# Patient Record
Sex: Female | Born: 1945 | Race: White | Hispanic: No | Marital: Single | Smoking: Former smoker
Health system: Southern US, Community
[De-identification: ages and names within clinical notes are randomized; demographics above are authoritative.]

## PROBLEM LIST (undated history)

## (undated) DIAGNOSIS — R05 Cough: Secondary | ICD-10-CM

## (undated) DIAGNOSIS — R9430 Abnormal result of cardiovascular function study, unspecified: Secondary | ICD-10-CM

## (undated) DIAGNOSIS — K219 Gastro-esophageal reflux disease without esophagitis: Secondary | ICD-10-CM

## (undated) DIAGNOSIS — Z923 Personal history of irradiation: Secondary | ICD-10-CM

## (undated) DIAGNOSIS — N189 Chronic kidney disease, unspecified: Secondary | ICD-10-CM

## (undated) DIAGNOSIS — I739 Peripheral vascular disease, unspecified: Secondary | ICD-10-CM

## (undated) DIAGNOSIS — D649 Anemia, unspecified: Secondary | ICD-10-CM

## (undated) DIAGNOSIS — F419 Anxiety disorder, unspecified: Secondary | ICD-10-CM

## (undated) DIAGNOSIS — E269 Hyperaldosteronism, unspecified: Secondary | ICD-10-CM

## (undated) DIAGNOSIS — G629 Polyneuropathy, unspecified: Secondary | ICD-10-CM

## (undated) DIAGNOSIS — I10 Essential (primary) hypertension: Secondary | ICD-10-CM

## (undated) DIAGNOSIS — E1142 Type 2 diabetes mellitus with diabetic polyneuropathy: Secondary | ICD-10-CM

## (undated) DIAGNOSIS — J40 Bronchitis, not specified as acute or chronic: Secondary | ICD-10-CM

## (undated) DIAGNOSIS — H409 Unspecified glaucoma: Secondary | ICD-10-CM

## (undated) DIAGNOSIS — F32A Depression, unspecified: Secondary | ICD-10-CM

## (undated) DIAGNOSIS — IMO0002 Reserved for concepts with insufficient information to code with codable children: Secondary | ICD-10-CM

## (undated) DIAGNOSIS — G809 Cerebral palsy, unspecified: Secondary | ICD-10-CM

## (undated) DIAGNOSIS — E7800 Pure hypercholesterolemia, unspecified: Secondary | ICD-10-CM

## (undated) DIAGNOSIS — F329 Major depressive disorder, single episode, unspecified: Secondary | ICD-10-CM

## (undated) DIAGNOSIS — M109 Gout, unspecified: Secondary | ICD-10-CM

## (undated) DIAGNOSIS — R1310 Dysphagia, unspecified: Secondary | ICD-10-CM

## (undated) DIAGNOSIS — M1990 Unspecified osteoarthritis, unspecified site: Secondary | ICD-10-CM

## (undated) DIAGNOSIS — I5032 Chronic diastolic (congestive) heart failure: Secondary | ICD-10-CM

## (undated) DIAGNOSIS — I119 Hypertensive heart disease without heart failure: Secondary | ICD-10-CM

## (undated) HISTORY — DX: Abnormal result of cardiovascular function study, unspecified: R94.30

## (undated) HISTORY — PX: BREAST BIOPSY: SHX20

## (undated) HISTORY — PX: BLADDER SURGERY: SHX569

## (undated) HISTORY — DX: Reserved for concepts with insufficient information to code with codable children: IMO0002

## (undated) HISTORY — PX: MASTECTOMY: SHX3

## (undated) HISTORY — PX: CLOSED REDUCTION PROXIMAL FIBULAR FRACTURE: SUR232

## (undated) HISTORY — DX: Chronic diastolic (congestive) heart failure: I50.32

## (undated) HISTORY — DX: Cough: R05

## (undated) HISTORY — DX: Type 2 diabetes mellitus with diabetic polyneuropathy: E11.42

## (undated) HISTORY — PX: LEG SURGERY: SHX1003

---

## 2002-04-06 ENCOUNTER — Encounter: Payer: Self-pay | Admitting: Internal Medicine

## 2002-04-06 ENCOUNTER — Ambulatory Visit (HOSPITAL_COMMUNITY): Admission: RE | Admit: 2002-04-06 | Discharge: 2002-04-06 | Payer: Self-pay | Admitting: Internal Medicine

## 2003-03-01 ENCOUNTER — Ambulatory Visit (HOSPITAL_COMMUNITY): Admission: RE | Admit: 2003-03-01 | Discharge: 2003-03-01 | Payer: Self-pay

## 2003-11-08 ENCOUNTER — Ambulatory Visit (HOSPITAL_COMMUNITY): Admission: RE | Admit: 2003-11-08 | Discharge: 2003-11-08 | Payer: Self-pay | Admitting: Internal Medicine

## 2003-11-10 ENCOUNTER — Other Ambulatory Visit: Admission: RE | Admit: 2003-11-10 | Discharge: 2003-11-10 | Payer: Self-pay | Admitting: Unknown Physician Specialty

## 2003-12-14 ENCOUNTER — Emergency Department (HOSPITAL_COMMUNITY): Admission: EM | Admit: 2003-12-14 | Discharge: 2003-12-15 | Payer: Self-pay | Admitting: Emergency Medicine

## 2004-01-27 ENCOUNTER — Ambulatory Visit (HOSPITAL_COMMUNITY): Admission: RE | Admit: 2004-01-27 | Discharge: 2004-01-27 | Payer: Self-pay | Admitting: Internal Medicine

## 2004-02-02 ENCOUNTER — Ambulatory Visit (HOSPITAL_COMMUNITY): Admission: RE | Admit: 2004-02-02 | Discharge: 2004-02-02 | Payer: Self-pay | Admitting: Internal Medicine

## 2004-04-01 ENCOUNTER — Emergency Department (HOSPITAL_COMMUNITY): Admission: EM | Admit: 2004-04-01 | Discharge: 2004-04-02 | Payer: Self-pay | Admitting: Emergency Medicine

## 2004-12-11 ENCOUNTER — Emergency Department (HOSPITAL_COMMUNITY): Admission: EM | Admit: 2004-12-11 | Discharge: 2004-12-12 | Payer: Self-pay | Admitting: *Deleted

## 2004-12-17 ENCOUNTER — Ambulatory Visit (HOSPITAL_COMMUNITY): Admission: RE | Admit: 2004-12-17 | Discharge: 2004-12-17 | Payer: Self-pay | Admitting: Urology

## 2005-04-24 ENCOUNTER — Emergency Department (HOSPITAL_COMMUNITY): Admission: EM | Admit: 2005-04-24 | Discharge: 2005-04-24 | Payer: Self-pay | Admitting: Emergency Medicine

## 2005-04-26 ENCOUNTER — Emergency Department (HOSPITAL_COMMUNITY): Admission: EM | Admit: 2005-04-26 | Discharge: 2005-04-26 | Payer: Self-pay | Admitting: Emergency Medicine

## 2005-05-23 ENCOUNTER — Encounter: Admission: RE | Admit: 2005-05-23 | Discharge: 2005-05-23 | Payer: Self-pay | Admitting: Internal Medicine

## 2005-06-10 ENCOUNTER — Encounter: Admission: RE | Admit: 2005-06-10 | Discharge: 2005-06-10 | Payer: Self-pay | Admitting: Internal Medicine

## 2005-06-20 ENCOUNTER — Ambulatory Visit (HOSPITAL_COMMUNITY): Admission: RE | Admit: 2005-06-20 | Discharge: 2005-06-20 | Payer: Self-pay | Admitting: Internal Medicine

## 2005-12-24 ENCOUNTER — Encounter: Admission: RE | Admit: 2005-12-24 | Discharge: 2005-12-24 | Payer: Self-pay | Admitting: Internal Medicine

## 2006-03-12 ENCOUNTER — Ambulatory Visit: Admission: RE | Admit: 2006-03-12 | Discharge: 2006-03-12 | Payer: Self-pay | Admitting: Internal Medicine

## 2006-10-28 ENCOUNTER — Encounter: Admission: RE | Admit: 2006-10-28 | Discharge: 2006-10-28 | Payer: Self-pay | Admitting: Internal Medicine

## 2007-07-20 ENCOUNTER — Emergency Department (HOSPITAL_COMMUNITY): Admission: EM | Admit: 2007-07-20 | Discharge: 2007-07-20 | Payer: Self-pay | Admitting: Emergency Medicine

## 2007-07-23 ENCOUNTER — Emergency Department (HOSPITAL_COMMUNITY): Admission: EM | Admit: 2007-07-23 | Discharge: 2007-07-23 | Payer: Self-pay | Admitting: Emergency Medicine

## 2007-09-22 ENCOUNTER — Ambulatory Visit: Payer: Self-pay | Admitting: Cardiology

## 2007-09-22 ENCOUNTER — Inpatient Hospital Stay (HOSPITAL_COMMUNITY): Admission: AD | Admit: 2007-09-22 | Discharge: 2007-09-28 | Payer: Self-pay | Admitting: Internal Medicine

## 2007-12-25 ENCOUNTER — Emergency Department (HOSPITAL_COMMUNITY): Admission: EM | Admit: 2007-12-25 | Discharge: 2007-12-25 | Payer: Self-pay | Admitting: Emergency Medicine

## 2008-07-12 ENCOUNTER — Encounter: Admission: RE | Admit: 2008-07-12 | Discharge: 2008-07-12 | Payer: Self-pay | Admitting: Internal Medicine

## 2008-12-19 ENCOUNTER — Ambulatory Visit: Payer: Self-pay | Admitting: Internal Medicine

## 2008-12-26 ENCOUNTER — Encounter: Payer: Self-pay | Admitting: Internal Medicine

## 2008-12-30 ENCOUNTER — Ambulatory Visit (HOSPITAL_COMMUNITY): Admission: RE | Admit: 2008-12-30 | Discharge: 2008-12-30 | Payer: Self-pay | Admitting: Internal Medicine

## 2008-12-30 ENCOUNTER — Ambulatory Visit: Payer: Self-pay | Admitting: Internal Medicine

## 2009-01-13 ENCOUNTER — Encounter: Payer: Self-pay | Admitting: Internal Medicine

## 2009-01-31 ENCOUNTER — Ambulatory Visit (HOSPITAL_COMMUNITY): Admission: RE | Admit: 2009-01-31 | Discharge: 2009-01-31 | Payer: Self-pay | Admitting: Ophthalmology

## 2009-02-14 ENCOUNTER — Ambulatory Visit (HOSPITAL_COMMUNITY): Admission: RE | Admit: 2009-02-14 | Discharge: 2009-02-14 | Payer: Self-pay | Admitting: Ophthalmology

## 2009-03-30 ENCOUNTER — Encounter: Payer: Self-pay | Admitting: Internal Medicine

## 2009-04-19 ENCOUNTER — Ambulatory Visit (HOSPITAL_COMMUNITY): Admission: RE | Admit: 2009-04-19 | Discharge: 2009-04-19 | Payer: Self-pay | Admitting: Internal Medicine

## 2009-04-19 ENCOUNTER — Ambulatory Visit: Payer: Self-pay | Admitting: Internal Medicine

## 2009-07-26 ENCOUNTER — Ambulatory Visit (HOSPITAL_COMMUNITY): Admission: RE | Admit: 2009-07-26 | Discharge: 2009-07-26 | Payer: Self-pay | Admitting: Internal Medicine

## 2009-09-05 ENCOUNTER — Ambulatory Visit (HOSPITAL_COMMUNITY): Admission: RE | Admit: 2009-09-05 | Discharge: 2009-09-05 | Payer: Self-pay | Admitting: Internal Medicine

## 2009-11-05 ENCOUNTER — Emergency Department (HOSPITAL_COMMUNITY): Admission: EM | Admit: 2009-11-05 | Discharge: 2009-11-06 | Payer: Self-pay | Admitting: Emergency Medicine

## 2009-11-24 ENCOUNTER — Emergency Department (HOSPITAL_COMMUNITY): Admission: EM | Admit: 2009-11-24 | Discharge: 2009-11-24 | Payer: Self-pay | Admitting: Emergency Medicine

## 2009-12-23 ENCOUNTER — Emergency Department (HOSPITAL_COMMUNITY): Admission: EM | Admit: 2009-12-23 | Discharge: 2009-12-23 | Payer: Self-pay | Admitting: Emergency Medicine

## 2010-01-09 ENCOUNTER — Ambulatory Visit (HOSPITAL_COMMUNITY): Admission: RE | Admit: 2010-01-09 | Discharge: 2010-01-09 | Payer: Self-pay | Admitting: Internal Medicine

## 2010-01-29 ENCOUNTER — Ambulatory Visit (HOSPITAL_COMMUNITY): Admission: RE | Admit: 2010-01-29 | Discharge: 2010-01-29 | Payer: Self-pay | Admitting: Internal Medicine

## 2010-06-28 ENCOUNTER — Ambulatory Visit (HOSPITAL_COMMUNITY): Admission: RE | Admit: 2010-06-28 | Discharge: 2010-06-28 | Payer: Self-pay | Admitting: Internal Medicine

## 2010-07-30 ENCOUNTER — Ambulatory Visit (HOSPITAL_COMMUNITY): Admission: RE | Admit: 2010-07-30 | Discharge: 2010-07-30 | Payer: Self-pay | Admitting: Internal Medicine

## 2010-09-19 ENCOUNTER — Emergency Department (HOSPITAL_COMMUNITY)
Admission: EM | Admit: 2010-09-19 | Discharge: 2010-09-19 | Payer: Self-pay | Source: Home / Self Care | Admitting: Emergency Medicine

## 2010-11-11 ENCOUNTER — Encounter: Payer: Self-pay | Admitting: Internal Medicine

## 2010-11-15 ENCOUNTER — Inpatient Hospital Stay (HOSPITAL_COMMUNITY)
Admission: EM | Admit: 2010-11-15 | Discharge: 2010-11-20 | Disposition: A | Discharge status: Home / Self Care | Payer: Self-pay | Source: Home / Self Care | Attending: Internal Medicine | Admitting: Internal Medicine

## 2010-11-15 LAB — DIFFERENTIAL
Basophils Absolute: 0 10*3/uL (ref 0.0–0.1)
Basophils Relative: 0 % (ref 0–1)
Neutro Abs: 14.3 10*3/uL — ABNORMAL HIGH (ref 1.7–7.7)
Neutrophils Relative %: 86 % — ABNORMAL HIGH (ref 43–77)

## 2010-11-15 LAB — COMPREHENSIVE METABOLIC PANEL
ALT: 17 U/L (ref 0–35)
AST: 27 U/L (ref 0–37)
Alkaline Phosphatase: 121 U/L — ABNORMAL HIGH (ref 39–117)
CO2: 25 mEq/L (ref 19–32)
Calcium: 10.5 mg/dL (ref 8.4–10.5)
Chloride: 97 mEq/L (ref 96–112)
GFR calc Af Amer: >60 mL/min (ref >60)
GFR calc non Af Amer: 51 mL/min — ABNORMAL LOW (ref >60)
Potassium: 4.3 mEq/L (ref 3.5–5.1)
Sodium: 137 mEq/L (ref 135–145)

## 2010-11-15 LAB — URINALYSIS, ROUTINE W REFLEX MICROSCOPIC
Bilirubin Urine: NEGATIVE
Hgb urine dipstick: NEGATIVE
Specific Gravity, Urine: 1.015 (ref 1.005–1.030)
Urobilinogen, UA: 0.2 mg/dL (ref 0.0–1.0)

## 2010-11-15 LAB — CBC
Hemoglobin: 13.6 g/dL (ref 12.0–15.0)
MCHC: 32.5 g/dL (ref 30.0–36.0)
RBC: 4.38 MIL/uL (ref 3.87–5.11)
WBC: 16.6 10*3/uL — ABNORMAL HIGH (ref 4.0–10.5)

## 2010-11-16 LAB — BASIC METABOLIC PANEL
BUN: 18 mg/dL (ref 6–23)
CO2: 27 mEq/L (ref 19–32)
Calcium: 8.9 mg/dL (ref 8.4–10.5)
Creatinine, Ser: 1.01 mg/dL (ref 0.4–1.2)
Glucose, Bld: 136 mg/dL — ABNORMAL HIGH (ref 70–99)

## 2010-11-16 LAB — DIFFERENTIAL
Basophils Relative: 0 % (ref 0–1)
Lymphocytes Relative: 9 % — ABNORMAL LOW (ref 12–46)
Monocytes Absolute: 1.9 10*3/uL — ABNORMAL HIGH (ref 0.1–1.0)
Monocytes Relative: 13 % — ABNORMAL HIGH (ref 3–12)
Neutro Abs: 11.7 10*3/uL — ABNORMAL HIGH (ref 1.7–7.7)

## 2010-11-16 LAB — CBC
HCT: 39.8 % (ref 36.0–46.0)
Hemoglobin: 13 g/dL (ref 12.0–15.0)
MCH: 31.1 pg (ref 26.0–34.0)
MCHC: 32.7 g/dL (ref 30.0–36.0)

## 2010-11-18 LAB — CBC
HCT: 35.9 % — ABNORMAL LOW (ref 36.0–46.0)
MCHC: 32.3 g/dL (ref 30.0–36.0)
RDW: 14.7 % (ref 11.5–15.5)

## 2010-11-18 LAB — DIFFERENTIAL
Basophils Absolute: 0.1 10*3/uL (ref 0.0–0.1)
Basophils Relative: 1 % (ref 0–1)
Eosinophils Relative: 12 % — ABNORMAL HIGH (ref 0–5)
Monocytes Absolute: 0.9 10*3/uL (ref 0.1–1.0)

## 2010-11-18 LAB — BASIC METABOLIC PANEL
Calcium: 9.1 mg/dL (ref 8.4–10.5)
Creatinine, Ser: 0.9 mg/dL (ref 0.4–1.2)
GFR calc Af Amer: >60 mL/min (ref >60)
GFR calc non Af Amer: >60 mL/min (ref >60)
Glucose, Bld: 134 mg/dL — ABNORMAL HIGH (ref 70–99)
Sodium: 135 mEq/L (ref 135–145)

## 2010-11-20 NOTE — Consult Note (Signed)
NAME:  [name], [name]                  ACCOUNT NO.:  [id number]  MEDICAL RECORD NO.:  [id number]          PATIENT TYPE:  INP  LOCATION:  A336                          FACILITY:  [facility]  PHYSICIAN:  R. Roetta Sessions, M.D. DATE OF BIRTH:  08/09/1946  DATE OF CONSULTATION: DATE OF DISCHARGE:                                CONSULTATION   REQUESTING PHYSICIAN:  Tesfaye D. Felecia Shelling, MD.  REASON FOR CONSULTATION:  Acute colitis.  HISTORY OF PRESENT ILLNESS:  [name] [name] is a 65 year old Caucasian female. She has a history of cerebral palsy.  She has been seen previously by Dr. Jena Gauss in 2010 for hematochezia.  She underwent a colonoscopy at that time on April 19, 2009.  She was found to have a long redundant normal colon, but this was with a poor prep.  She tells me yesterday, she had nausea and vomited once.  She is having some generalized abdominal pain. It does radiate to her back.  She tells me she has not had any diarrhea. She believes she has had one bowel movement per day.  She denies rectal bleeding or melena.  She tells her appetite was fine prior to the onset of this.  She tells me her weight has been stable.  She has had a history of GERD, but this has been well controlled outpatient on Ranitidine 150 mg b.i.d.  She did have a CT scan of abdomen and pelvis with IV and oral contrast that showed descending colitis without perforation or abscess.  She was started on empiric Cipro 400 mg b.i.d. and Flagyl 500 mg t.i.d.  Her white count was found to be 15.1.  She was on Cipro 500 mg b.i.d. on October 26, 2010 through October 31, 2010.  PAST MEDICAL AND SURGICAL HISTORY:  She has history of: 1. Hypertension. 2. Cerebral palsy. 3. Osteoporosis. 4. Osteoarthritis. 5. Recurrent bronchitis. 6. Urinary incontinence. 7. Obesity. 8. GERD. 9. Scoliosis of lumbar spine. 10.Multiple lower extremity surgery. 11.Degenerative joint disease. 12.Last colonoscopy on April 19, 2009 for hematochezia that  showed a     long redundant normal colon with a poor prep.  Incomplete     colonoscopy on December 30, 2008 by Dr. Jena Gauss due to an incomplete     prep. 13.Gout. 14.Hyperlipidemia.  MEDICATIONS:  Prior to admission: 1. Albuterol q.4 h p.r.n. wheezing. 2. Lactulose 10 g/15 mL 2 tablespoons or 30 mL daily p.r.n.     constipation. 3. Optive eyedrops 1 drop in each eye p.r.n. 4. Acetaminophen 1 g q.4 h p.r.n. 5. Mylanta 30 mL b.i.d. p.r.n. 6. Boudre butt paste 3 times daily p.r.n. 7. Potassium chloride 20 mEq b.i.d. 8. Ranitidine 150 mg b.i.d. 9. Advair Diskus 250/50 one puff b.i.d. 10.Naproxen 500 mg b.i.d. p.r.n. 11.Benazepril 20 mg daily. 12.Cyclobenzaprine 10 mg t.i.d. 13.Lasix 40 mg b.i.d. 14.Lovastatin 20 mg daily. 15.Oxybutynin 5 mg b.i.d. 16.Lortab 10/500 mg q.i.d. 17.Allopurinol 300 mg daily. 18.Combivent 2 puffs q.i.d. 19.Oyster Calcium 500 mg with vitamin D t.i.d. 20.Gabapentin 100 mg t.i.d. 21.Neosporin to leg wound b.i.d. 22.Doxycycline 100 mg b.i.d., which was started on October 20, 2010  and completed on November 02, 2010. ALLERGIES:  No known drug allergies.  FAMILY HISTORY:  There is no known family history of carcinoma or chronic GI problems.  She has multiple siblings.  SOCIAL HISTORY:  [name] [name] is single.  She does not have any children. She resides at [facility].  She tells me her brother who lives in [place] is involved in her care.  She has a remote history of tobacco use.  Denies any alcohol or drug use.  REVIEW OF SYSTEMS:  See HPI, otherwise negative review of systems.  PHYSICAL EXAMINATION:  VITAL SIGNS:  Temperature 98.6, pulse 70, respirations 18, blood pressure 131/76, weight is 104.7 kg, height 65 inches. GENERAL:  She is an [name], who is alert, oriented, pleasant, and cooperative. HEENT:  Sclerae clear, nonicteric.  Conjunctivae pink.  Oropharynx pink and moist without any lesions. NECK:  Supple without mass or  thyromegaly. HEART:  Regular rhythm.  Normal S1 and S2 without murmurs, rubs, or gallops. LUNGS:  Faint expiratory wheezes, no acute distress. ABDOMEN:  She has a large scar to the left lower quadrant.  There is no rebound.  She has positive bowel sounds x4.  Abdomen is soft and nontender.  There is no rebound tenderness or guarding.  No hepatosplenomegaly or mass, although exam is limited given the patient's body habitus. EXTREMITIES:  She has multiple well-healed scars to both legs.  She has 2+ pitting edema bilaterally and changes of chronic venous stasis.  LABORATORY STUDIES:  She has a hemoglobin of 13, hematocrit 39.8, platelets 243.  Calcium 8.9, sodium 140, potassium 4.5, chloride 102, CO2 of 27, BUN 18, creatinine 1.01, glucose 136.  LFTs are normal except for alkaline phosphatase of 121.  Urinalysis was negative.  IMPRESSION:  [name] [name] is a 65 year old Caucasian female with history of cerebral palsy, who presents with an acute nausea and vomiting and descending colitis on CT.  Given her recent antibiotic use, we do need to rule out C. diff colitis, although she does not give report of diarrhea.  She could have other infectious process and ischemia remains in the differential as well.  It is reassuring that she did have a normal colonoscopy back in 2010, so development of inflammatory bowel disease would be unlikely at this point.  However, she is on NSAIDs and this could be NSAID induced colitis as well.  PLAN: 1. I would avoid NSAIDs at this point, agree with labs in the morning     to include CBC and CMP. 2. Stool for C. diff by PCR, ova and parasite, culture and     sensitivity. 3. Agree with empiric Cipro and Flagyl. 4. Continue supportive measures.  Thank you for allowing [place] to participate in the care of [name] [name].     Lorenza Burton, N.P.   ______________________________ R. Roetta Sessions, M.D.    Dustin Folks  D:  11/16/2010  T:  11/16/2010  Job:  [id number]  cc:    Ninetta Lights D. Felecia Shelling, MD Fax: [phone]  Electronically Signed by Lorenza Burton N.P. on 11/19/2010 04:11:24 PM Electronically Signed by Lorrin Goodell M.D. on 11/20/2010 01:43:20 PM

## 2010-11-23 ENCOUNTER — Encounter (INDEPENDENT_AMBULATORY_CARE_PROVIDER_SITE_OTHER): Payer: Self-pay | Admitting: *Deleted

## 2010-11-28 NOTE — Miscellaneous (Signed)
Summary: CONSULTATION  Clinical Lists Changes  NAME:  [name] [name], [name] [name]                  ACCOUNT NO.:  [id number]      MEDICAL RECORD NO.:  [id number]          PATIENT TYPE:  INP      LOCATION:  A336                          FACILITY:  [facility]      PHYSICIAN:  R. Roetta Sessions, M.D. DATE OF BIRTH:  [birth year]      DATE OF CONSULTATION:   DATE OF DISCHARGE:                                    CONSULTATION         REQUESTING PHYSICIAN:  Tesfaye D. Felecia Shelling, MD.      REASON FOR CONSULTATION:  Acute colitis.      HISTORY OF PRESENT ILLNESS:  [name] [name] is a 65 year old Caucasian female.   She has a history of cerebral palsy.  She has been seen previously by   Dr. Jena Gauss in 2010 for hematochezia.  She underwent a colonoscopy at that   time on April 19, 2009.  She was found to have a long redundant normal   colon, but this was with a poor prep.  She tells me yesterday, she had   nausea and vomited once.  She is having some generalized abdominal pain.   It does radiate to her back.  She tells me she has not had any diarrhea.   She believes she has had one bowel movement per day.  She denies rectal   bleeding or melena.  She tells her appetite was fine prior to the onset   of this.  She tells me her weight has been stable.  She has had a   history of GERD, but this has been well controlled outpatient on   Ranitidine 150 mg b.i.d.  She did have a CT scan of abdomen and pelvis   with IV and oral contrast that showed descending colitis without   perforation or abscess.  She was started on empiric Cipro 400 mg b.i.d.   and Flagyl 500 mg t.i.d.  Her white count was found to be 15.1.  She was   on Cipro 500 mg b.i.d. on October 26, 2010 through October 31, 2010.      PAST MEDICAL AND SURGICAL HISTORY:  She has history of:   1. Hypertension.   2. Cerebral palsy.   3. Osteoporosis.   4. Osteoarthritis.   5. Recurrent bronchitis.   6. Urinary incontinence.   7. Obesity.   8. GERD.   9. Scoliosis of lumbar  spine.   10.Multiple lower extremity surgery.   11.Degenerative joint disease.   12.Last colonoscopy on April 19, 2009 for hematochezia that showed a       long redundant normal colon with a poor prep.  Incomplete       colonoscopy on December 30, 2008 by Dr. Jena Gauss due to an incomplete       prep.   13.Gout.   14.Hyperlipidemia.      MEDICATIONS:  Prior to admission:   1. Albuterol q.4 h p.r.n. wheezing.   2. Lactulose 10 g/15 mL 2 tablespoons or 30 mL daily p.r.n.  constipation.   3. Optive eyedrops 1 drop in each eye p.r.n.   4. Acetaminophen 1 g q.4 h p.r.n.   5. Mylanta 30 mL b.i.d. p.r.n.   6. Boudre butt paste 3 times daily p.r.n.   7. Potassium chloride 20 mEq b.i.d.   8. Ranitidine 150 mg b.i.d.   9. Advair Diskus 250/50 one puff b.i.d.   10.Naproxen 500 mg b.i.d. p.r.n.   11.Benazepril 20 mg daily.   12.Cyclobenzaprine 10 mg t.i.d.   13.Lasix 40 mg b.i.d.   14.Lovastatin 20 mg daily.   15.Oxybutynin 5 mg b.i.d.   16.Lortab 10/500 mg q.i.d.   17.Allopurinol 300 mg daily.   18.Combivent 2 puffs q.i.d.   19.Oyster Calcium 500 mg with vitamin D t.i.d.   20.Gabapentin 100 mg t.i.d.   21.Neosporin to leg wound b.i.d.   22.Doxycycline 100 mg b.i.d., which was started on October 20, 2010       and completed on November 02, 2010.   ALLERGIES:  No known drug allergies.      FAMILY HISTORY:  There is no known family history of carcinoma or   chronic GI problems.  She has multiple siblings.      SOCIAL HISTORY:  [name] [name] is single.  She does not have any children.   She resides at [facility].  She tells me her brother who lives   in [place] is involved in her care.  She has a remote history of tobacco   use.  Denies any alcohol or drug use.      REVIEW OF SYSTEMS:  See HPI, otherwise negative review of systems.      PHYSICAL EXAMINATION:  VITAL SIGNS:  Temperature 98.6, pulse 70,   respirations 18, blood pressure 131/76, weight is 104.7 kg, height 65   inches.    GENERAL:  She is an [name], who is alert, oriented,   pleasant, and cooperative.   HEENT:  Sclerae clear, nonicteric.  Conjunctivae pink.  Oropharynx pink   and moist without any lesions.   NECK:  Supple without mass or thyromegaly.   HEART:  Regular rhythm.  Normal S1 and S2 without murmurs, rubs, or   gallops.   LUNGS:  Faint expiratory wheezes, no acute distress.   ABDOMEN:  She has a large scar to the left lower quadrant.  There is no   rebound.  She has positive bowel sounds x4.  Abdomen is soft and   nontender.  There is no rebound tenderness or guarding.  No   hepatosplenomegaly or mass, although exam is limited given the patient's   body habitus.   EXTREMITIES:  She has multiple well-healed scars to both legs.  She has   2+ pitting edema bilaterally and changes of chronic venous stasis.      LABORATORY STUDIES:  She has a hemoglobin of 13, hematocrit 39.8,   platelets 243.  Calcium 8.9, sodium 140, potassium 4.5, chloride 102,   CO2 of 27, BUN 18, creatinine 1.01, glucose 136.  LFTs are normal except   for alkaline phosphatase of 121.  Urinalysis was negative.      IMPRESSION:  [name] [name] is a 65 year old Caucasian female with history of   cerebral palsy, who presents with an acute nausea and vomiting and   descending colitis on CT.  Given her recent antibiotic use, we do need   to rule out C. diff colitis, although she does not give report of   diarrhea.  She could have  other infectious process and ischemia remains   in the differential as well.  It is reassuring that she did have a   normal colonoscopy back in 2010, so development of inflammatory bowel   disease would be unlikely at this point.  However, she is on NSAIDs and   this could be NSAID induced colitis as well.      PLAN:   1. I would avoid NSAIDs at this point, agree with labs in the morning       to include CBC and CMP.   2. Stool for C. diff by PCR, ova and parasite, culture and        sensitivity.   3. Agree with empiric Cipro and Flagyl.   4. Continue supportive measures.      Thank you for allowing [place] to participate in the care of [name] [name].               Lorenza Burton, N.P.         ______________________________   R. Roetta Sessions, M.D.            Dustin Folks  D:  11/16/2010  T:  11/16/2010  Job:  [id number]      cc:   Ninetta Lights D. Felecia Shelling, MD   Fax: [phone]      Electronically Signed by Lorenza Burton N.P. on 11/19/2010 04:11:24 PM   Electronically Signed by Lorrin Goodell M.D. on 11/20/2010 01:43:20 PM

## 2010-12-05 ENCOUNTER — Emergency Department (HOSPITAL_COMMUNITY): Payer: Medicare Other

## 2010-12-05 ENCOUNTER — Observation Stay (HOSPITAL_COMMUNITY)
Admission: EM | Admit: 2010-12-05 | Discharge: 2010-12-06 | Disposition: A | Discharge status: Skilled Nursing Facility | Payer: Medicare Other | Attending: Internal Medicine | Admitting: Internal Medicine

## 2010-12-05 DIAGNOSIS — Z9181 History of falling: Secondary | ICD-10-CM | POA: Insufficient documentation

## 2010-12-05 DIAGNOSIS — G809 Cerebral palsy, unspecified: Principal | ICD-10-CM | POA: Insufficient documentation

## 2010-12-05 DIAGNOSIS — K219 Gastro-esophageal reflux disease without esophagitis: Secondary | ICD-10-CM | POA: Insufficient documentation

## 2010-12-05 DIAGNOSIS — J40 Bronchitis, not specified as acute or chronic: Secondary | ICD-10-CM | POA: Insufficient documentation

## 2010-12-05 DIAGNOSIS — Z79899 Other long term (current) drug therapy: Secondary | ICD-10-CM | POA: Insufficient documentation

## 2010-12-05 DIAGNOSIS — M1990 Unspecified osteoarthritis, unspecified site: Secondary | ICD-10-CM | POA: Insufficient documentation

## 2010-12-05 DIAGNOSIS — I10 Essential (primary) hypertension: Secondary | ICD-10-CM | POA: Insufficient documentation

## 2010-12-05 LAB — DIFFERENTIAL
Basophils Absolute: 0 10*3/uL (ref 0.0–0.1)
Basophils Relative: 0 % (ref 0–1)
Eosinophils Absolute: 0.7 10*3/uL (ref 0.0–0.7)
Eosinophils Relative: 8 % — ABNORMAL HIGH (ref 0–5)
Monocytes Absolute: 0.7 10*3/uL (ref 0.1–1.0)
Neutro Abs: 5.5 10*3/uL (ref 1.7–7.7)

## 2010-12-05 LAB — POCT CARDIAC MARKERS
CKMB, poc: 1.3 ng/mL (ref 1.0–8.0)
Myoglobin, poc: 229 ng/mL (ref 12–200)
Troponin i, poc: <0.05 ng/mL (ref 0.00–0.09)

## 2010-12-05 LAB — CBC
HCT: 41.4 % (ref 36.0–46.0)
MCV: 96.1 fL (ref 78.0–100.0)
Platelets: 231 10*3/uL (ref 150–400)
RBC: 4.31 MIL/uL (ref 3.87–5.11)
RDW: 15.3 % (ref 11.5–15.5)
WBC: 8.8 10*3/uL (ref 4.0–10.5)

## 2010-12-05 LAB — COMPREHENSIVE METABOLIC PANEL
Albumin: 3.4 g/dL — ABNORMAL LOW (ref 3.5–5.2)
Alkaline Phosphatase: 56 U/L (ref 39–117)
BUN: 16 mg/dL (ref 6–23)
Calcium: 9.6 mg/dL (ref 8.4–10.5)
Glucose, Bld: 148 mg/dL — ABNORMAL HIGH (ref 70–99)
Potassium: 3.8 mEq/L (ref 3.5–5.1)
Sodium: 137 mEq/L (ref 135–145)
Total Protein: 6 g/dL (ref 6.0–8.3)

## 2010-12-05 LAB — URINALYSIS, ROUTINE W REFLEX MICROSCOPIC
Bilirubin Urine: NEGATIVE
Nitrite: NEGATIVE
Protein, ur: NEGATIVE mg/dL
Specific Gravity, Urine: 1.015 (ref 1.005–1.030)
Urobilinogen, UA: 0.2 mg/dL (ref 0.0–1.0)

## 2010-12-05 NOTE — H&P (Signed)
NAME:  [name] [name], [name] [name]                  ACCOUNT NO.:  [id number]  MEDICAL RECORD NO.:  [id number]          PATIENT TYPE:  INP  LOCATION:  A336                          FACILITY:  [facility]  PHYSICIAN:  Tiawanna Luchsinger D. Felecia Shelling, MD   DATE OF BIRTH:  08/18/1946  DATE OF ADMISSION:  11/15/2010 DATE OF DISCHARGE:  LH                             HISTORY & PHYSICAL   CHIEF COMPLAINT:  Abdominal pain.  HISTORY OF PRESENT ILLNESS:  This is a 65 year old female patient with history of multiple medical illnesses who is currently a resident of [facility], brought to emergency room with the above complaints.  The patient had a sudden onset of left lower quadrant pain. The pain gradually got worse.  She had no nausea or vomiting.  There was no diarrhea.  She was brought to the emergency room and evaluated.  Her CT scan of the abdomen showed inflammatory changes on descending colon. There is no abscess or perforation.  The patient, however, has leukocytosis.  She was empirically started on combination of IV antibiotics and admitted for further treatment.  REVIEW OF SYSTEMS:  The patient has no fever, chills, cough, shortness of breath, chest pain, nausea, vomiting, dysuria, urgency, or frequency of urination.  PAST MEDICAL HISTORY: 1. Cerebral palsy. 2. Hypertension. 3. Degenerative joint disease. 4. Urinary incontinence. 5. Morbid obesity. 6. Gastroesophageal reflux disease. 7. History of recurrent bronchitis.  CURRENT MEDICATIONS: 1. Advair Diskus 250/25 one puff b.i.d. 2. Allopurinol 300 mg daily. 3. Benazepril 20 mg daily. 4. Combivent 2 puffs q.i.d. 5. Detrol 5 mg p.o. b.i.d. 6. Flexeril 10 mg t.i.d. 7. Gabapentin 100 mg t.i.d. 8. Lortab 10/500 one tablet p.o. q.i.d. 9. Lactulose 30 mL daily as needed. 10.Lasix 40 mg b.i.d. 11.Mevacor 20 mg daily. 12.Os-Cal with vitamin D 1 tablet t.i.d. 13.KCl 20 mEq b.i.d. 14.Zantac 150 mg b.i.d. 15.Tylenol 1000 mg p.r.n. as needed.  SOCIAL  HISTORY:  The patient is currently a resident of [facility].  No history of alcohol, tobacco, or substance abuse.  The patient is disabled due to her cerebral palsy.  She is wheelchair bound and total care.  FAMILY HISTORY:  Not available at this time.  PHYSICAL EXAMINATION:  GENERAL:  The patient is alert, awake, and chronically sick looking. VITAL SIGNS:  Blood pressure 120/83, pulse 111, respiratory rate 20, temperature 98.3 degrees Fahrenheit. HEENT:  Pupils are equal and reactive. NECK:  Supple. CHEST:  Decreased air entry, few rhonchi. CARDIOVASCULAR SYSTEM:  First and second heart sound heard.  No murmur, no gallop. ABDOMEN:  Obese.  Bowel sounds positive.  The patient has moderate right lower quadrant tenderness. EXTREMITIES:  2+ edema.  LABS ON ADMISSION:  CBC; WBC 16.6, hemoglobin 13.6, and hematocrit 41.9. Sodium 137, potassium 4.3, chloride 97, carbon dioxide 25, glucose 149, BUN 19, creatinine 1.9.  Total bilirubin 0.6, alkaline phosphatase 21, SGOT 27, SGPT 17, total protein 7.3, albumin 4.1, calcium 10.5, lipase 22.  Urinalysis; specific gravity 1.015, pH 5.0, nitrite negative, leukocytes negative.  ASSESSMENT: 1. Acute colitis, etiology not clear, possibility of infection versus     inflammatory bowel disease  will be considered. 2. Cerebral palsy. 3. Morbid obesity. 4. Degenerative joint disease. 5. Hypertension. 6. History of gastroesophageal reflux disease. 7. History of bronchitis.  PLAN:  We will start the patient empirically on combination of ciprofloxacin and Flagyl.  We will continue her regular medications.  We will do GI consult.  We will continue to monitor CBC, BMP, and continue supportive care.     Jacole Capley D. Felecia Shelling, MD     TDF/MEDQ  D:  11/16/2010  T:  11/16/2010  Job:  [id number]  Electronically Signed by Avon Gully MD on 12/05/2010 08:18:34 AM

## 2010-12-05 NOTE — Discharge Summary (Signed)
  NAME:  [name], [name]                  ACCOUNT NO.:  [id number]  MEDICAL RECORD NO.:  [id number]          PATIENT TYPE:  INP  LOCATION:  A336                          FACILITY:  [facility]  PHYSICIAN:  Abbygayle Helfand D. Felecia Shelling, MD   DATE OF BIRTH:  27-Jul-1946  DATE OF ADMISSION:  11/15/2010 DATE OF DISCHARGE:  01/31/2012LH                              DISCHARGE SUMMARY   DISCHARGE DIAGNOSES: 1. Colitis, probably secondary to infectious etiology. 2. Cerebral palsy. 3. Morbid obesity. 4. Degenerative joint disease. 5. Hypertension. 6. History of gastroesophageal reflux disease. 7. History of recurrent bronchitis.  DISCHARGE MEDICATIONS: 1. Flagyl 500 mg p.o. t.i.d. for 5 days. 2. Cipro 500 mg p.o. b.i.d. for 5 days. 3. Advair 250/50 one puff b.i.d. 4. Allopurinol 300 mg p.o. daily. 5. Lisinopril 20 mg daily. 6. Ditropan 5 mg p.o. daily. 7. Flexeril 10 mg t.i.d. 8. Gabapentin 100 mg t.i.d. 9. Lortab 5/500 one tablet p.o. q.i.d. 10.Lasix 40 mg p.o. daily. 11.Zocor 10 mg daily. 12.Calcium carbonate 500 mg t.i.d. 13.KCl 20 mEq b.i.d. 14.Pepcid 20 mg b.i.d. 15.Atrovent and Proventil nebulizer q.i.d. p.r.n. for cough. 16.Colace 100 mg b.i.d. 17.Prednisone 40 mg p.o. for 3 days with tapering schedule.  DISPOSITION:  The patient will be discharged to [facility].  DISCHARGE INSTRUCTIONS:  The patient will continue current medications including oral antibiotics and the patient will be followed in the office.  She will continue her medications.  HOSPITAL COURSE:  This is a 65 year old female patient with a history of multiple medical illnesses was admitted due to abdominal pain.  Her CT scan showed descending colitis without perforation or abscess.  The patient was started on IV Flagyl and Cipro.  Over the hospital stay, the patient improved.  She will be [facility] back to rest home on oral antibiotics.     Allora Bains D. Felecia Shelling, MD     TDF/MEDQ  D:  11/20/2010  T:  11/21/2010   Job:  [id number]  Electronically Signed by Avon Gully MD on 12/05/2010 08:18:31 AM

## 2010-12-06 LAB — GLUCOSE, CAPILLARY: Glucose-Capillary: 178 mg/dL — ABNORMAL HIGH (ref 70–99)

## 2010-12-17 ENCOUNTER — Emergency Department (HOSPITAL_COMMUNITY)
Admission: EM | Admit: 2010-12-17 | Discharge: 2010-12-17 | Disposition: A | Discharge status: Home / Self Care | Payer: Medicare Other | Attending: Emergency Medicine | Admitting: Emergency Medicine

## 2010-12-17 ENCOUNTER — Emergency Department (HOSPITAL_COMMUNITY): Payer: Medicare Other

## 2010-12-17 DIAGNOSIS — K219 Gastro-esophageal reflux disease without esophagitis: Secondary | ICD-10-CM | POA: Insufficient documentation

## 2010-12-17 DIAGNOSIS — Z79899 Other long term (current) drug therapy: Secondary | ICD-10-CM | POA: Insufficient documentation

## 2010-12-17 DIAGNOSIS — E7800 Pure hypercholesterolemia, unspecified: Secondary | ICD-10-CM | POA: Insufficient documentation

## 2010-12-17 DIAGNOSIS — R5383 Other fatigue: Secondary | ICD-10-CM | POA: Insufficient documentation

## 2010-12-17 DIAGNOSIS — M810 Age-related osteoporosis without current pathological fracture: Secondary | ICD-10-CM | POA: Insufficient documentation

## 2010-12-17 DIAGNOSIS — J4489 Other specified chronic obstructive pulmonary disease: Secondary | ICD-10-CM | POA: Insufficient documentation

## 2010-12-17 DIAGNOSIS — G809 Cerebral palsy, unspecified: Secondary | ICD-10-CM | POA: Insufficient documentation

## 2010-12-17 DIAGNOSIS — I10 Essential (primary) hypertension: Secondary | ICD-10-CM | POA: Insufficient documentation

## 2010-12-17 DIAGNOSIS — Z8639 Personal history of other endocrine, nutritional and metabolic disease: Secondary | ICD-10-CM | POA: Insufficient documentation

## 2010-12-17 DIAGNOSIS — R000 Tachycardia, unspecified: Secondary | ICD-10-CM | POA: Insufficient documentation

## 2010-12-17 DIAGNOSIS — M1990 Unspecified osteoarthritis, unspecified site: Secondary | ICD-10-CM | POA: Insufficient documentation

## 2010-12-17 DIAGNOSIS — Z862 Personal history of diseases of the blood and blood-forming organs and certain disorders involving the immune mechanism: Secondary | ICD-10-CM | POA: Insufficient documentation

## 2010-12-17 DIAGNOSIS — R5381 Other malaise: Secondary | ICD-10-CM | POA: Insufficient documentation

## 2010-12-17 DIAGNOSIS — J449 Chronic obstructive pulmonary disease, unspecified: Secondary | ICD-10-CM | POA: Insufficient documentation

## 2010-12-17 LAB — DIFFERENTIAL
Basophils Absolute: 0 10*3/uL (ref 0.0–0.1)
Basophils Relative: 1 % (ref 0–1)
Eosinophils Absolute: 0.4 10*3/uL (ref 0.0–0.7)
Monocytes Relative: 11 % (ref 3–12)
Neutro Abs: 3.5 10*3/uL (ref 1.7–7.7)
Neutrophils Relative %: 51 % (ref 43–77)

## 2010-12-17 LAB — CBC
Hemoglobin: 13.9 g/dL (ref 12.0–15.0)
MCH: 31.4 pg (ref 26.0–34.0)
Platelets: 224 10*3/uL (ref 150–400)
RBC: 4.43 MIL/uL (ref 3.87–5.11)
WBC: 6.8 10*3/uL (ref 4.0–10.5)

## 2010-12-17 LAB — URINALYSIS, ROUTINE W REFLEX MICROSCOPIC
Bilirubin Urine: NEGATIVE
Nitrite: NEGATIVE
Specific Gravity, Urine: 1.019 (ref 1.005–1.030)
Urobilinogen, UA: 0.2 mg/dL (ref 0.0–1.0)

## 2010-12-17 LAB — POCT I-STAT, CHEM 8
Calcium, Ion: 1.18 mmol/L (ref 1.12–1.32)
HCT: 44 % (ref 36.0–46.0)
Hemoglobin: 15 g/dL (ref 12.0–15.0)
Sodium: 140 mEq/L (ref 135–145)
TCO2: 26 mmol/L (ref 0–100)

## 2010-12-17 LAB — GLUCOSE, CAPILLARY: Glucose-Capillary: 156 mg/dL — ABNORMAL HIGH (ref 70–99)

## 2010-12-17 LAB — PREGNANCY, URINE: Preg Test, Ur: NEGATIVE

## 2010-12-18 LAB — URINE CULTURE

## 2010-12-28 NOTE — H&P (Signed)
  NAME:  [name] [name], [name] [name]                  ACCOUNT NO.:  [id number]  MEDICAL RECORD NO.:  [id number]           PATIENT TYPE:  I  LOCATION:  A309                          FACILITY:  [facility]  PHYSICIAN:  Una Yeomans D. Felecia Shelling, MD   DATE OF BIRTH:  October 11, 1946  DATE OF ADMISSION:  12/05/2010 DATE OF DISCHARGE:  LH                             HISTORY & PHYSICAL   CHIEF COMPLAINT:  Difficult to ambulate.  HISTORY OF PRESENT ILLNESS:  This is a 65 year old female patient with history of multiple medical illnesses, who is a resident of [facility], who was brought to emergency room due to difficult to ambulate.  The assisted living was unable to provide enough care for the patient.  She is total care and the patient was not able to participate in her daily living activity.  She was admitted for placement.  REVIEW OF SYSTEMS:  No fever, chills, headache, cough, chest pain, shortness of breath, abdominal pain, dysuria, urgency or frequency of urination.  PAST MEDICAL HISTORY: 1. Cerebral palsy. 2. Morbid obesity. 3. Degenerative joint disease. 4. Hypertension. 5. Gastroesophageal reflux disease. 6. Recurrent bronchitis. 7. History of recent colitis.  CURRENT MEDICATIONS: 1. Advair 250/50 one puff b.i.d. 2. Allopurinol 300 mg daily. 3. Lisinopril 20 mg daily. 4. Ditropan 5 mg p.o. daily. 5. Flexeril 10 mg t.i.d. 6. Gabapentin 100 mg t.i.d. 7. Lortab 5/500 one tablet p.o. q.i.d. 8. Lasix 40 mg daily. 9. Zocor 10 mg daily. 10.Calcium carbonate 500 mg t.i.d. 11.KCl 20 mg b.i.d. 12.Pepcid 20 mg b.i.d. 13.Atrovent and Proventil nebulizer q.i.d. 14.Colace 100 mg b.i.d.  SOCIAL HISTORY:  The patient is currently a resident of assisted living home.  The patient has advanced cerebral palsy and she requires total care.  No history of alcohol, tobacco, or substance abuse.  FAMILY HISTORY:  Not available at this time.  PHYSICAL EXAMINATION:  GENERAL:  The patient is alert, awake,  and chronically sick looking. VITAL SIGNS:  Blood pressure 123/87, pulse 114, respiratory rate 20, temperature 98 degrees Fahrenheit. HEENT:  Pupils are equal and reactive. NECK:  Supple. CHEST:  Clear lung fields, good air entry. CARDIOVASCULAR SYSTEM:  First and second heart sounds heard.  No murmur, no gallop. ABDOMEN:  Soft and lax.  Bowel sound is positive.  No mass or organomegaly. EXTREMITIES:  No leg edema.  ASSESSMENT:  This is a 65 year old female patient with history of multiple medical illnesses including morbid obesity and cerebral palsy. The patient is being admitted for observation for placement to nursing home.  The assisted living facility is not able to provide the patient her care.  PLAN:  We will continue the patient on her regular medications.  We will do social worker consult for placement and continue supportive care.     Juleen Sorrels D. Felecia Shelling, MD     TDF/MEDQ  D:  12/06/2010  T:  12/06/2010  Job:  [id number]  Electronically Signed by Avon Gully MD on 12/27/2010 08:52:48 AM

## 2010-12-28 NOTE — Discharge Summary (Signed)
  NAME:  Manwarren, Diavian                  ACCOUNT NO.:  [id number]  MEDICAL RECORD NO.:  [id number]           PATIENT TYPE:  I  LOCATION:  A309                          FACILITY:  [facility]  PHYSICIAN:  Zamar Odwyer D. Felecia Shelling, MD   DATE OF BIRTH:  Nov 29, 1945  DATE OF ADMISSION:  12/05/2010 DATE OF DISCHARGE:  02/16/2012LH                              DISCHARGE SUMMARY   DISCHARGED DIAGNOSES: 1. Severe deconditioning. 2. Advanced cerebral palsy. 3. Degenerative joint disease. 4. History of recent colitis. 5. Hypertension. 6. Gastroesophageal reflux disease. 7. Recurrent bronchitis.  DISCHARGE MEDICATIONS: 1. Advair 250/50 one puff b.i.d. 2. Benazepril 30 mg p.o. daily. 3. Allopurinol 300 mg p.o. daily. 4. Calcium with vitamin D 1 tablet p.o. t.i.d. 5. Atrovent and Proventil nebulizer q.4 h p.r.n.. 6. Flexeril 10 mg t.i.d. 7. Furosemide 40 mg daily. 8. Gabapentin 100 t.i.d. 9. Simvastatin 10 mg daily. 10.Naproxen 500 mg b.i.d. 11.Ditropan 5 mg b.i.d. 12.Pepcid 20 mg b.i.d. 13.Potassium 20 mEq p.o. b.i.d. 14.Acetaminophen 500 mg q.6 h p.r.n. 15.Lactulose 30 mL p.o. daily p.r.n. 16.Lortab 5/500 one tablet p.o. q.6 h p.r.n.  DISPOSITION:  The patient was being transferred to skilled nursing facility.  DISCHARGE INSTRUCTIONS:  The patient will receive physical therapy and continue skilled nursing care.  LABORATORY DATA:  On discharge, sodium 137, potassium 3.8, chloride 98, carbon dioxide 13, glucose 148, BUN 16, creatinine 0.7, calcium 9.6. CBC:  WBC 8.8, hemoglobin 13.2, hematocrit 41.4, and platelets 231.  HOSPITAL COURSE:  This is a 65 year old female patient with history of multiple medical illnesses including cerebral palsy, was admitted due to severe deconditioning.  The patient was previously in assisted living who were not able to provide her care.  The patient is going to be placed in nursing home to continue skilled nursing care and her regular medications.     Mliss Wedin  D. Felecia Shelling, MD     TDF/MEDQ  D:  12/06/2010  T:  12/06/2010  Job:  [id number]  Electronically Signed by Avon Gully MD on 12/27/2010 08:52:44 AM

## 2011-01-06 LAB — BASIC METABOLIC PANEL
BUN: 10 mg/dL (ref 6–23)
CO2: 27 mEq/L (ref 19–32)
Chloride: 94 mEq/L — ABNORMAL LOW (ref 96–112)
GFR calc Af Amer: >60 mL/min (ref >60)
Potassium: 3.5 mEq/L (ref 3.5–5.1)

## 2011-01-06 LAB — DIFFERENTIAL
Eosinophils Absolute: 0 10*3/uL (ref 0.0–0.7)
Eosinophils Relative: 0 % (ref 0–5)
Lymphs Abs: 2 10*3/uL (ref 0.7–4.0)
Monocytes Relative: 10 % (ref 3–12)

## 2011-01-06 LAB — CBC
HCT: 43.9 % (ref 36.0–46.0)
MCV: 91.4 fL (ref 78.0–100.0)
RBC: 4.81 MIL/uL (ref 3.87–5.11)
WBC: 14.4 10*3/uL — ABNORMAL HIGH (ref 4.0–10.5)

## 2011-01-06 LAB — URIC ACID: Uric Acid, Serum: 9.2 mg/dL — ABNORMAL HIGH (ref 2.4–7.0)

## 2011-01-06 LAB — CULTURE, BLOOD (ROUTINE X 2): Report Status: 1222011

## 2011-01-13 LAB — URINALYSIS, ROUTINE W REFLEX MICROSCOPIC
Bilirubin Urine: NEGATIVE
Nitrite: NEGATIVE
Specific Gravity, Urine: 1.015 (ref 1.005–1.030)
Urobilinogen, UA: 0.2 mg/dL (ref 0.0–1.0)

## 2011-01-13 LAB — DIFFERENTIAL
Basophils Absolute: 0 10*3/uL (ref 0.0–0.1)
Eosinophils Relative: 1 % (ref 0–5)
Lymphocytes Relative: 9 % — ABNORMAL LOW (ref 12–46)
Lymphs Abs: 1 10*3/uL (ref 0.7–4.0)
Monocytes Absolute: 0.7 10*3/uL (ref 0.1–1.0)
Monocytes Relative: 6 % (ref 3–12)

## 2011-01-13 LAB — BASIC METABOLIC PANEL
GFR calc non Af Amer: 60 mL/min — ABNORMAL LOW (ref >60)
Glucose, Bld: 193 mg/dL — ABNORMAL HIGH (ref 70–99)
Potassium: 3.7 mEq/L (ref 3.5–5.1)
Sodium: 135 mEq/L (ref 135–145)

## 2011-01-13 LAB — CBC
HCT: 45 % (ref 36.0–46.0)
Hemoglobin: 14.9 g/dL (ref 12.0–15.0)
RDW: 13.9 % (ref 11.5–15.5)

## 2011-01-13 LAB — URINE MICROSCOPIC-ADD ON

## 2011-01-13 LAB — URINE CULTURE: Culture: NO GROWTH

## 2011-01-30 LAB — BASIC METABOLIC PANEL
BUN: 13 mg/dL (ref 6–23)
CO2: 32 mEq/L (ref 19–32)
Chloride: 100 mEq/L (ref 96–112)
Creatinine, Ser: 0.82 mg/dL (ref 0.4–1.2)
Glucose, Bld: 94 mg/dL (ref 70–99)

## 2011-03-05 NOTE — Op Note (Signed)
NAME:  [name] [name], [name] [name]                  ACCOUNT NO.:  [id number]   MEDICAL RECORD NO.:  [id number]          PATIENT TYPE:  AMB   LOCATION:  DAY                           FACILITY:  [facility]   PHYSICIAN:  R. Roetta Sessions, M.D. DATE OF BIRTH:  1946/05/17   DATE OF PROCEDURE:  04/19/2009  DATE OF DISCHARGE:                               OPERATIVE REPORT   PROCEDURE PERFORMED:  Diagnostic colonoscopy.   INDICATIONS FOR PROCEDURE:  A 65 year old resident of a nursing home  with history of intermittent hematochezia.  We attempted colonoscopy  back in March this year.  Prep was inadequate.  She returns for a  diagnostic colonoscopy today.  She apparently has not had hematochezia  recently.  Colonoscopy now being done.  Risks, benefits, alternatives  and limitations have been discussed, questions answered.  Please see  documentation in the medical record.   PROCEDURE NOTE:  O2 saturation, blood pressure, pulse and respirations  monitored throughout the entire procedure.  Conscious sedation with  Versed 3 mg IV, Demerol 50 mg IV in divided doses.  Instrument is Pentax  video chip system.   FINDINGS:  Digital rectal exam revealed no abnormalities.   ENDOSCOPIC FINDINGS:  Unfortunately, the prep was again poor but felt to  be doable.  Colon:  Colonic mucosa surveyed from the rectosigmoid junction through  the left, transverse, right colon, area of appendiceal orifice,  ileocecal valve and cecum.  These structures were seen and photographed  for the record.  From this level, the scope was slowly and cautiously  withdrawn.  Previously mentioned mucosal surfaces were again seen.  The  patient a long capacious colon.  It took some maneuvers including  changing the patient's position on the stretcher and external abdominal  pressure to reach the cecum, quite a bit of vegetable  matter and  semiformed stool throughout the colon which had to be washed and  suctioned.  The scope kept getting occluded.  I  spent some time cleaning  things out all the way to the cecum.  However, the colonic mucosa that  was seen, appeared entirely normal.  Scope was pulled down to the rectum  where a thorough examination of the rectal mucosa including retroflex  view of the anal verge demonstrated no abnormalities.  Cecal withdrawal  time 7 minutes.   IMPRESSION:  1. Normal rectum.  2. Long redundant capacious colon.  No gross colonic mucosal      abnormalities.  Poor prep compromised exam.   RECOMMENDATIONS:  1. Consider repeat screening colonoscopy in 10 years.  2. If the patient has any evidence of ongoing GI bleeding or anemia,      then further evaluation would be warranted.      Jonathon Bellows, M.D.  Electronically Signed     RMR/MEDQ  D:  04/19/2009  T:  04/19/2009  Job:  [id number]   cc:   Tesfaye D. Felecia Shelling, MD  Fax: [phone]

## 2011-03-05 NOTE — Op Note (Signed)
NAME:  Vandervort, Cing                  ACCOUNT NO.:  [id number]   MEDICAL RECORD NO.:  [id number]          PATIENT TYPE:  AMB   LOCATION:  DAY                           FACILITY:  [facility]   PHYSICIAN:  R. Roetta Sessions, M.D. DATE OF BIRTH:  03-20-1946   DATE OF PROCEDURE:  12/30/2008  DATE OF DISCHARGE:                               OPERATIVE REPORT   PROCEDURE:  Incomplete, poor prep screening diagnostic colonoscopy.   INDICATIONS FOR PROCEDURE:  A 65 year old lady with cerebral palsy,  nursing home patient with intermittent hematochezia.  She comes for  colonoscopy.  Risks, benefits, alternatives and limitations have been  discussed.  She has never had her lower GI tract imaged previously.  No  family history of colorectal cancer.   PROCEDURE NOTE:  O2 saturation, blood pressure, pulse, respirations were  monitored throughout the entire procedure.  Conscious sedation:  Versed  3 mg IV, Demerol 50 mg IV, in divided doses.  Instrument:  Pentax video  chip system.   FINDINGS:  Digital rectal exam revealed no abnormalities.   Endoscopic findings:  The prep was inadequate and was poor.  I advanced  the scope into the left colon and saw a couple of diverticula.  There  was quite a bit of viscous, liquid stool.  I suctioned and washed out  what I could.  The further I got the worse the prep became to the point  there were solid stool elements in the proximal left colon which really  precluded an adequate exam.  Therefore, I backed off and terminated the  procedure.  The patient tolerated what we did very well.   IMPRESSION:  Inadequate colon prep.  Colonoscopy terminated by me.  The  patient tolerated the procedure well.  Will need to regroup and get her  adequately prepped and try again in the next several weeks.      Jonathon Bellows, M.D.  Electronically Signed     RMR/MEDQ  D:  12/30/2008  T:  12/30/2008  Job:  [id number]   cc:   Tesfaye D. Felecia Shelling, MD  Fax: [phone]

## 2011-03-05 NOTE — H&P (Signed)
NAME:  [name] [name], [name] [name]                  ACCOUNT NO.:  [id number]   MEDICAL RECORD NO.:  [id number]          PATIENT TYPE:  INP   LOCATION:  A340                          FACILITY:  [facility]   PHYSICIAN:  Tesfaye D. Felecia Shelling, MD   DATE OF BIRTH:  [birth year]   DATE OF ADMISSION:  09/22/2007  DATE OF DISCHARGE:  [facility]                              HISTORY & PHYSICAL   CHIEF COMPLAINT:  Cough and shortness of breath.   HISTORY OF PRESENT ILLNESS:  This is a 65 year old patient with history  of multiple medical illnesses who was a resident of a local rest home.  She was brought to my office due to above complaints.  The patient  initially had symptoms of upper respiratory tract infection which  progressively got worse, and the patient has been coughing frequently.  She has some associated shortness of breath and wheezing.  The patient  was receiving cough syrup and inhalers.  However, her symptoms got  worse, and the patient was having difficulty breathing even at rest.  She was evaluated in the office and was admitted as a possible case of  chronic obstructive pulmonary disease with a possible superimposed  pneumonia.   REVIEW OF SYSTEMS:  The patient has generalized weakness and loss of  appetite.  No fever or chills.  No chest pain, nausea, vomiting,  abdominal pain, dysuria, urgency or frequency of urination.   PAST MEDICAL HISTORY:  1. Cerebral palsy.  2. History of recurrent bronchitis.  3. Hypertension.  4. Urinary incontinence.  5. Obesity.  6. Gastroesophageal reflux disease.   CURRENT MEDICATIONS:  1. Benazepril 30 mg p.o. daily.  2. Combivent inhaler two puffs q.i.d.  3. Flexeril 10 mg t.i.d.  4. Lasix 40 mg b.i.d.  5. Lortab 5/500 one tablets q.4 h p.r.n.  6. Lactulose 10 ml p.o. daily p.r.n.  7. KCl 20 mEq daily.  8. Zantac 150 mg b.i.d.  9. Vesicare 10 mg p.o. daily.  10.Acetaminophen one tablet 500 mg q.6 h p.r.n. pain.   SOCIAL HISTORY:  The patient is current resident of  [facility].  No history of alcohol, tobacco or substance abuse.   PHYSICAL EXAMINATION:  GENERAL:  The patient is alert awake and acutely  sick looking.  VITAL SIGNS:  Blood pressure 122/80, pulse 120 per minute, respiratory  rate 32, temperature 98 degrees Fahrenheit.  HEENT:  Pupils are equal and reactive.  NECK:  Supple.  CHEST:  Poor air entry bilaterally, diffuse expiratory wheezes and  rhonchi.  CARDIOVASCULAR;  first and second heart sound heard.  No murmur, no  gallop.  ABDOMEN:  Obese, soft and lax.  Bowel sounds positive.  EXTREMITIES:  The patient has deformities of her lower extremities.   ASSESSMENT:  1. Acute exacerbation of chronic obstructive pulmonary disease with a      possible superimposed pneumonia.  2. Cerebral palsy.  3. Hypertension.  4. Osteoarthritis.  5. History of gastroesophageal reflux disease.   PLAN:  Will do baseline studies including CBC, BMP, chest x-ray, EKG and  blood culture and a respiratory  culture.  Will start the patient on a  combination of Zithromax and Rocephin.  Will start the patient also on  IV antibiotics.  Will start also nebulizer treatments q.4 h while awake.  And I will continue the patient on her regular treatment.      Tesfaye D. Felecia Shelling, MD  Electronically Signed     TDF/MEDQ  D:  09/22/2007  T:  09/23/2007  Job:  [id number]

## 2011-03-05 NOTE — Consult Note (Signed)
NAME:  [name] [name], [name] [name]                  ACCOUNT NO.:  [id number]   MEDICAL RECORD NO.:  [id number]          PATIENT TYPE:  AMB   LOCATION:  DAY                           FACILITY:  [facility]   PHYSICIAN:  R. Roetta Sessions, M.D. DATE OF BIRTH:  June 08, 1946   DATE OF CONSULTATION:  12/19/2008  DATE OF DISCHARGE:                                 CONSULTATION   REQUESTING PHYSICIAN:  Tesfaye D. Felecia Shelling, MD   REASON FOR CONSULTATION:  Rectal bleeding.   HISTORY OF PRESENT ILLNESS:  [name] [name] is a 65 year old lady with history  of cerebral palsy who presents today for further evaluation of  hematochezia.  The patient is able to provide some of her history, but  not complete history.  She tells me that she does sign her own consent  forms.  At times, some of her speech is somewhat difficult to  understand.  She was referred to [place] by Dr. Felecia Shelling for further evaluation  of hematochezia.  She denies any abdominal pain, constipation, or rectal  pain.  She states her appetite overall is fair.  She has had no reported  weight loss according to the transporter today who had helped to take  care her some at the rest home.  The patient denies any dysphagia,  odynophagia, or heartburn.  She states she has never had a colonoscopy.  Recent labs revealed a hemoglobin of 15.2, hematocrit 47.1 on November 24, 2008.  She has a history of hypertension, cerebral palsy,  osteoporosis, osteoarthritis, recurrent bronchitis, urinary  incontinence, obesity, and GERD.  She has history of severe deformities  of the back and lower extremities as well as scoliosis in the lumbar  region.  She has had multiple surgeries on the lower extremities, but  she is not able to give me any details.  She was hospitalized in  December 2008 with bronchitis and yeast in the sputum.  She has  degenerative joint disease.   MEDICATIONS:  1. Lactulose 30 mL daily.  2. Oxybutynin 5 mg b.i.d.  3. Gabapentin 100 mg t.i.d.  4. Lasix 40 mg b.i.d.  5.  Cyclobenzaprine 10 mg t.i.d.  6. Benazepril 20 mg daily.  7. Combivent inhaler q.i.d.  8. Calcium 500 mg with vitamin D daily.  9. Triple antibiotic ointment b.i.d. to legs.  10.Hydrocodone 10/500 mg daily.  11.Colchicine 0.6 mg daily.  12.Benazepril 10 mg daily.  13.Ranitidine 150 mg b.i.d.  14.Potassium 20 mEq b.i.d.  15.Lovastatin 20 mg daily.  16.DuoNeb q.i.d. p.r.n.  17.Tylenol p.r.n.  18.Mylanta p.r.n.   ALLERGIES:  No known drug allergies, per [facility] contact.   FAMILY HISTORY:  Both parents were deceased.  She has multiple siblings,  otherwise unobtainable.   SOCIAL HISTORY:  She is single.  No children.  She is a resident of  [facility].  She quit smoking in the past.  No history of  alcohol or drug use.   REVIEW OF SYSTEMS:  See HPI for GI and constitutional.  Cardiopulmonary:  She denies chest pain or shortness of breath.  Genitourinary:  She  complains of urinary incontinence and frequent urination, but no  dysuria, no hematuria.   PHYSICAL EXAMINATION:  VITAL SIGNS:  Weight unobtainable, height  unknown, temp 97.8, blood pressure 110/72, and pulse 92.  GENERAL:  [name], [name] in no acute distress.  SKIN:  Warm and dry.  No jaundice.  HEENT:  Sclerae nonicteric.  Oropharyngeal mucosa moist and pink.  CHEST:  Lungs are clear to auscultation.  CARDIAC:  Regular rate and rhythm.  No murmurs.  ABDOMEN:  Obese, soft, and nontender.  She has some scars in the left  lower quadrant region.  She is examined in the wheelchair due to the  fact she is not able to get up from the table.  LOWER EXTREMITIES:  Markedly edematous about 3+ pitting edema with  chronic quality with chronic venous stasis changes.  RECTAL:  Cannot be done due to the patient not able to get up from the  table.   IMPRESSION:  [name] [name] is a 66 year old lady with reported hematochezia  with normal hemoglobin.  No prior history of colonoscopy.  Differential   diagnoses include benign anorectal source such as hemorrhoids, ulcers,  polyps, or malignancy.   PLAN:  1. Colonoscopy in the near future with Dr. Jena Gauss.  I have discussed      risks, alternatives, and benefits with the patient regarding, but      not limited to the risk reaction to medication, bleeding,      infection, perforation, and she is agreeable.  2. We would like her to follow up with Dr. Felecia Shelling regarding her lower      extremity edema.  I have asked for nursing      staff to contact him regarding this issue.  3. Further recommendations to follow.   I would like to thank Dr. Felecia Shelling for allowing [place] to take part in the care  of this patient.      Tana Coast, P.AJonathon Bellows, M.D.  Electronically Signed    LL/MEDQ  D:  12/19/2008  T:  12/20/2008  Job:  [id number]   cc:   Tesfaye D. Felecia Shelling, MD  Fax: [phone]   [facility]

## 2011-03-05 NOTE — Op Note (Signed)
NAME:  Eckford, Elleanor                  ACCOUNT NO.:  [id number]   MEDICAL RECORD NO.:  [id number]          PATIENT TYPE:  AMB   LOCATION:  DAY                           FACILITY:  [facility]   PHYSICIAN:  R. Roetta Sessions, M.D. DATE OF BIRTH:  [birth year]   DATE OF PROCEDURE:  04/19/2009  DATE OF DISCHARGE:  04/19/2009                               OPERATIVE REPORT   INDICATIONS FOR PROCEDURE:   Dictation ended at this point.      Jonathon Bellows, M.D.  Electronically Signed     RMR/MEDQ  D:  04/19/2009  T:  04/20/2009  Job:  [id number]

## 2011-03-05 NOTE — Procedures (Signed)
NAME:  [name] [name], [name] [name]                  ACCOUNT NO.:  [id number]   MEDICAL RECORD NO.:  [id number]          PATIENT TYPE:  INP   LOCATION:  A340                          FACILITY:  [facility]   PHYSICIAN:  Gerrit Friends. Dietrich Pates, MD, FACCDATE OF BIRTH:  11-Jan-1946   DATE OF PROCEDURE:  09/23/2007  DATE OF DISCHARGE:                                ECHOCARDIOGRAM   REFERRING:  Dr. Felecia Shelling.   CLINICAL DATA:  A 65 year old gentleman with tachycardia, dyspnea and  hypertension.   M-MODE:  Aorta 2.9, left atrium 3.9, septum 1.1, posterior wall 1.0, LV  diastole 4.0, LV systole 3.1.   1. Technically somewhat difficult and limited echocardiographic study.  2. Grossly normal left atrium and left ventricle; normal right      ventricular size with mild hypertrophy and hyperdynamic systolic      function.  3. Normal diameter of the proximal ascending aorta; mild calcification      of the wall.  4. Normal mitral, tricuspid and pulmonic valves; normal proximal      pulmonary artery.  5. Aortic valve not adequately imaged.  6. Normal left ventricular size; not all myocardial segments well      seen; no areas of akinesis nor dyskinesis; overall LV systolic      function is normal to hyperdynamic.  7. Normal Doppler examination; physiologic tricuspid regurgitation.  8. Comparison with prior study of February 02, 2004:  Improved technical      quality of the present examination.      Gerrit Friends. Dietrich Pates, MD, [facility]  Electronically Signed     RMR/MEDQ  D:  09/23/2007  T:  09/24/2007  Job:  [id number]

## 2011-03-08 NOTE — Procedures (Signed)
NAME:  [name] [name], [name] [name]                            ACCOUNT NO.:  [id number]   MEDICAL RECORD NO.:  [id number]                   PATIENT TYPE:  OUT   LOCATION:  RAD                                  FACILITY:  [facility]   PHYSICIAN:  Vida Roller, M.D.                DATE OF BIRTH:  Mar 13, 1946   DATE OF PROCEDURE:  02/02/2004  DATE OF DISCHARGE:                                  ECHOCARDIOGRAM   TAPE NUMBER:  LB [id number], tape count 0 through 582.   INDICATIONS:  This is a 65 year old woman with congestive heart failure and  morbid obesity.   TECHNICAL QUALITY:  Severely technically difficult.   FINDINGS:  There is no clinical information to be gathered from this study  due to the significant morbid obesity of the patient, and the very-poor  acoustic windows.  There appears to be no obvious valvular heart disease,  but assessment of left ventricular systolic function is beyond the limits of  this study.      ___________________________________________                                            Vida Roller, M.D.   JH/MEDQ  D:  02/02/2004  T:  02/02/2004  Job:  [id number]

## 2011-03-08 NOTE — Op Note (Signed)
NAME:  [name], [name]                              ACCOUNT NO.:  [id number]   MEDICAL RECORD NO.:  [id number]                  PATIENT TYPE:  AMB   LOCATION:                                       FACILITY:   PHYSICIAN:  Ky Barban, M.D.            DATE OF BIRTH:   DATE OF PROCEDURE:  DATE OF DISCHARGE:                                 OPERATIVE REPORT   PREOPERATIVE DIAGNOSIS:  Stress urinary incontinence.   POSTOPERATIVE DIAGNOSIS:  Stress urinary incontinence.   OPERATION/PROCEDURE:  Tension free vaginal tape.   ANESTHESIA:  General.   SURGEON:  Ky Barban, M.D.   DESCRIPTION OF PROCEDURE:  The patient was given general endotracheal  anesthesia, placed in the lithotomy position, had usual prep and drape.  A  #20 Foley catheter was introduced into the bladder.  The area of the  suprapubic site is marked at the level of the pubic tubercle.  Then the base  of the urethra is infiltrated with 10 mL of normal saline.  A 1.5 cm long  incision in mid urethra is made.  The distal end of the incision ends about  1 cm proximal to the meatus.  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  Once the base is infiltrated, incision is made under the urethra and  perirectal flaps are developed gently enough to introduce the tip of my  trocar.  Once this was developed, the Foley catheter was then removed and  the rigid Foley catheter was introduced deflecting the bladder neck to the  right side and the first trocar is introduced at the level of the mid  urethra on the left side.  It is pushed in the retropubic space, hugging to  the back of the pubic symphysis and came out at the level of the pubic  tubercle on the skin marked with a marker.  The trocar was left in place and  the bladder was inspected with right angle lens after  filling the bladder  with about 300 mL of saline.  The trocar was outside the bladder.  Now the  bladder is emptied and trocar is pulled into the suprapubic area, pulling  along with a blue cord.  I checked the bladder again to make sure the blue  cord was outside the bladder.  Once the left cord was in place, the extra  cord length was removed and hemostasis was applied to both ends of the blue  cord.  The rigid Foley catheter was reintroduced again and the bladder neck  was deflected to the left side and the trocar was introduced in the mid  urethra on the right side and pushed in the retropubic space again, hugging  to the back of  the pubic symphysis and coming out at the level of the pubic  tubercle on the marked skin site.  The bladder is checked again to make sure  the blue cord is outside of the bladder.  Once both blue cords are in place,  the tape is hooked up to the vaginal side of the blue cord on both sides and  it is pulled, first the right and then the left side.  Now both ends of the  tape are in the suprapubic area.  The sheath covering the tape has been  removed in the middle so that both ends separate.  Now with the curved Mayo  scissors between the urethra and the tape, the redundancy from the tape is  removed and the tension of the tape is adjusted.  Once the proper tension  was achieved, the sheath covering the tape is removed from the suprapubic  side.  The Mayo scissors is removed.  The tape is lying gently against the  urethra.  The vaginal wound is thoroughly irrigated with saline and then it  is closed with interrupted sutures of 3-0 Vicryl.  There is no bleeding  going on.  The redundant vaginal tape is removed flush with the skin and  Steri-Strips applied to the skin incision.  Then it was covered with a Band-  Aid.  Foley catheter was removed and at this point the patient was taken to  the recovery room in satisfactory condition.                                                 Ky Barban, M.D.    MIJ/MEDQ  D:  03/01/2003  T:  03/01/2003  Job:  [id number]

## 2011-03-08 NOTE — Discharge Summary (Signed)
NAME:  [name] [name], [name] [name]                  ACCOUNT NO.:  [id number]   MEDICAL RECORD NO.:  [id number]          PATIENT TYPE:  INP   LOCATION:  A340                          FACILITY:  [facility]   PHYSICIAN:  Tesfaye D. Felecia Shelling, MD   DATE OF BIRTH:  1946-07-15   DATE OF ADMISSION:  09/22/2007  DATE OF DISCHARGE:  12/08/2008LH                               DISCHARGE SUMMARY   DISCHARGE DIAGNOSES:  1. Acute bronchitis.  2. Yeast in the sputum.  3. Cerebral palsy.  4. Degenerative joint disease.  5. Tachycardia, etiology not clear.  6. Morbid obesity.  7. Gastroesophageal reflux disease.  8. Hypertension.  9. Urinary incontinence.   DISCHARGE MEDICATIONS:  1. Lasix 40 mg day daily.  2. KCl 20 mg daily.  3. Ranitidine 150 mg daily.  4. Vesicare 10 mg daily.  5. Hydrocodone/acetaminophen in 10/500 one tablet p.o. daily.  6. Benazepril  20 mg daily.  7. DuoNebq.4 hours while awake.  8. Cyclobenzaprine 10 mg daily.  9. Diflucan 100 mg daily for 7 days.  10.Prednisone 30 mg tapering dose.  11.MiraLax 17 g daily.  12.Augmentin 500 mg t.i.d.  13.Vesicare 10 mg p.o. daily.  14.Benazepril 10 mg daily.   DISPOSITION:  The patient was discharged back to [facility] rest home.   HOSPITAL COURSE:  This is a 65 year old white female with history of  multiple medical illnesses including cerebral palsy who was admitted due  to cough and severe shortness of breath.  The patient was seen in the  office with the above symptoms.  She has been initially treated as a  case of upper respiratory tract infection.  However, her symptoms  continued to get worse.  The patient was wheezing and she was short of  breath.  Her chest x-ray however  was negative for pneumonia.  She was treated as a case of acute  bronchitis.  She grew yeast in her sputum for which she received  Diflucan.   DISPOSITION:  The patient improved and she was discharged back to the  [facility] rest home.      Tesfaye D. Felecia Shelling, MD  Electronically Signed     TDF/MEDQ  D:  10/19/2007  T:  10/19/2007  Job:  [id number]

## 2011-03-08 NOTE — H&P (Signed)
NAME:  [name] [name], [name] [name]                            ACCOUNT NO.:  [id number]   MEDICAL RECORD NO.:  [id number]                   PATIENT TYPE:  AMB   LOCATION:  DAY                                  FACILITY:  [facility]   PHYSICIAN:  Ky Barban, M.D.            DATE OF BIRTH:  1946/04/21   DATE OF ADMISSION:  DATE OF DISCHARGE:                                HISTORY & PHYSICAL   CHIEF COMPLAINT:  Stress urinary incontinence.   HISTORY OF PRESENT ILLNESS:  [age]-year-old female is well-known to me,  has severe stress incontinence.  She underwent workup with cystoscopy and  cystometrics in the office.  Cystometrics was essentially negative, but has  stress incontinence which goes away with the elevation of the bladder neck.  About 13 years ago, I had done Stamey endoscopic bladder neck suspension for  the same problem.  This patient did okay for several years, then started  back with stress incontinence again.  I have recommended that she undergo  tension-free vaginal tape procedure.  Its complications, especially urinary  retention, were discussed.  I want to mention this patient has multiple and  severe deformities of her back and lower extremities and she has severe  scoliosis in the lumbar region and it is difficult for her to lie down in  the lithotomy position.  She had multiple surgeries on her lower extremities  and I have told her that I will see if she can lie down in lithotomy  position; only then, we can perform this procedure, otherwise, we will have  to cancel it.  She is very miserable with this problem and considering the  way she is, I think if there is anything that can be done to help to reduce  her misery, it will be good, so that is why I think at least if we can do  something to help to get rid of the stress incontinence, it will improve her  quality of life, but the main thing is that if she can be placed in  lithotomy position.  Her deformities have become  worse since I did surgery  on her in 32.   PERSONAL HISTORY:  She does not smoke or drink.   REVIEW OF SYSTEMS:  Unremarkable.   PHYSICAL EXAMINATION:  GENERAL:  On examination, obese female, not in acute  distress, fully conscious, alert, oriented and has deformities of her back  and lower extremities.  CENTRAL NERVOUS SYSTEM:  No gross neurological deficits.  HEAD AND NECK/EYES/ENT:  Negative.  CHEST:  Chest symmetrical.  HEART:  Regular sinus rhythm.  ABDOMEN:  Abdomen obese.  Liver, spleen and kidneys are not palpable.  PELVIC:  Has excoriated perineum from leakage of urine.  EXTREMITIES:  Extremities show marked deformities and scarring from previous  surgery.    IMPRESSION:  1. Stress urinary incontinence.  2. 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                                               Ky Barban, M.D.    MIJ/MEDQ  D:  02/28/2003  T:  03/01/2003  Job:  [id number]

## 2011-06-27 ENCOUNTER — Other Ambulatory Visit (HOSPITAL_COMMUNITY): Payer: Self-pay | Admitting: Diagnostic Radiology

## 2011-06-27 DIAGNOSIS — Z139 Encounter for screening, unspecified: Secondary | ICD-10-CM

## 2011-07-15 LAB — URINALYSIS, ROUTINE W REFLEX MICROSCOPIC
Bilirubin Urine: NEGATIVE
Hgb urine dipstick: NEGATIVE
Nitrite: NEGATIVE
Specific Gravity, Urine: 1.01
pH: 6

## 2011-07-29 LAB — DIFFERENTIAL
Basophils Relative: 1
Eosinophils Absolute: 0.1 — ABNORMAL LOW
Lymphs Abs: 1.4
Monocytes Absolute: 1
Monocytes Relative: 10

## 2011-07-29 LAB — CBC
Hemoglobin: 15.1 — ABNORMAL HIGH
Platelets: 264
RDW: 13.7

## 2011-07-29 LAB — T4, FREE: Free T4: 0.95

## 2011-07-29 LAB — CULTURE, RESPIRATORY W GRAM STAIN: Culture: NORMAL

## 2011-07-29 LAB — COMPREHENSIVE METABOLIC PANEL
ALT: 27
AST: 30
Albumin: 3.8
Alkaline Phosphatase: 81
Calcium: 9.2
GFR calc Af Amer: >60
Potassium: 3.7
Sodium: 134 — ABNORMAL LOW
Total Protein: 6.9

## 2011-07-29 LAB — URINE MICROSCOPIC-ADD ON

## 2011-07-29 LAB — BLOOD GAS, ARTERIAL
Bicarbonate: 24
FIO2: 0.21
Patient temperature: 37
pH, Arterial: 7.452 — ABNORMAL HIGH

## 2011-07-29 LAB — TSH: TSH: 1.201

## 2011-07-29 LAB — EXPECTORATED SPUTUM ASSESSMENT W GRAM STAIN, RFLX TO RESP C

## 2011-07-29 LAB — URINALYSIS, ROUTINE W REFLEX MICROSCOPIC
Glucose, UA: NEGATIVE
Protein, ur: NEGATIVE

## 2011-07-29 LAB — CULTURE, BLOOD (ROUTINE X 2)

## 2011-08-01 LAB — URINALYSIS, ROUTINE W REFLEX MICROSCOPIC
Bilirubin Urine: NEGATIVE
Glucose, UA: NEGATIVE
Specific Gravity, Urine: 1.015
Urobilinogen, UA: 0.2

## 2011-08-01 LAB — URIC ACID: Uric Acid, Serum: 9.9 — ABNORMAL HIGH

## 2011-08-01 LAB — D-DIMER, QUANTITATIVE: D-Dimer, Quant: 1.16 — ABNORMAL HIGH

## 2011-08-01 LAB — URINE MICROSCOPIC-ADD ON

## 2011-08-05 ENCOUNTER — Other Ambulatory Visit (HOSPITAL_BASED_OUTPATIENT_CLINIC_OR_DEPARTMENT_OTHER): Payer: Self-pay | Admitting: Internal Medicine

## 2011-08-05 ENCOUNTER — Other Ambulatory Visit: Payer: Self-pay

## 2011-08-05 ENCOUNTER — Ambulatory Visit (HOSPITAL_COMMUNITY)
Admission: RE | Admit: 2011-08-05 | Discharge: 2011-08-05 | Disposition: A | Discharge status: Home / Self Care | Payer: Medicare Other | Source: Ambulatory Visit | Attending: Diagnostic Radiology | Admitting: Diagnostic Radiology

## 2011-08-05 DIAGNOSIS — Z139 Encounter for screening, unspecified: Secondary | ICD-10-CM

## 2011-08-13 ENCOUNTER — Other Ambulatory Visit (HOSPITAL_BASED_OUTPATIENT_CLINIC_OR_DEPARTMENT_OTHER): Payer: Self-pay | Admitting: Internal Medicine

## 2011-08-13 DIAGNOSIS — Z1231 Encounter for screening mammogram for malignant neoplasm of breast: Secondary | ICD-10-CM

## 2011-09-09 ENCOUNTER — Ambulatory Visit
Admission: RE | Admit: 2011-09-09 | Discharge: 2011-09-09 | Disposition: A | Discharge status: Home / Self Care | Payer: Medicare Other | Source: Ambulatory Visit | Attending: Internal Medicine | Admitting: Internal Medicine

## 2011-09-09 DIAGNOSIS — Z1231 Encounter for screening mammogram for malignant neoplasm of breast: Secondary | ICD-10-CM

## 2011-12-09 ENCOUNTER — Other Ambulatory Visit (HOSPITAL_BASED_OUTPATIENT_CLINIC_OR_DEPARTMENT_OTHER): Payer: Self-pay | Admitting: Internal Medicine

## 2011-12-11 ENCOUNTER — Other Ambulatory Visit (HOSPITAL_BASED_OUTPATIENT_CLINIC_OR_DEPARTMENT_OTHER): Payer: Self-pay | Admitting: Internal Medicine

## 2011-12-11 DIAGNOSIS — M810 Age-related osteoporosis without current pathological fracture: Secondary | ICD-10-CM

## 2011-12-18 ENCOUNTER — Ambulatory Visit (HOSPITAL_COMMUNITY)
Admission: RE | Admit: 2011-12-18 | Discharge: 2011-12-18 | Disposition: A | Discharge status: Home / Self Care | Payer: Medicare Other | Source: Ambulatory Visit | Attending: Internal Medicine | Admitting: Internal Medicine

## 2011-12-18 ENCOUNTER — Other Ambulatory Visit (HOSPITAL_BASED_OUTPATIENT_CLINIC_OR_DEPARTMENT_OTHER): Payer: Self-pay | Admitting: Internal Medicine

## 2011-12-18 DIAGNOSIS — Z13820 Encounter for screening for osteoporosis: Secondary | ICD-10-CM | POA: Insufficient documentation

## 2011-12-18 DIAGNOSIS — M810 Age-related osteoporosis without current pathological fracture: Secondary | ICD-10-CM

## 2011-12-18 DIAGNOSIS — Z780 Asymptomatic menopausal state: Secondary | ICD-10-CM | POA: Insufficient documentation

## 2012-09-07 ENCOUNTER — Ambulatory Visit (HOSPITAL_COMMUNITY)
Admission: RE | Admit: 2012-09-07 | Discharge: 2012-09-07 | Disposition: A | Discharge status: Home / Self Care | Payer: PRIVATE HEALTH INSURANCE | Source: Ambulatory Visit | Attending: Internal Medicine | Admitting: Internal Medicine

## 2012-09-07 DIAGNOSIS — I10 Essential (primary) hypertension: Secondary | ICD-10-CM | POA: Insufficient documentation

## 2012-09-07 DIAGNOSIS — R1311 Dysphagia, oral phase: Secondary | ICD-10-CM

## 2012-09-07 DIAGNOSIS — J4489 Other specified chronic obstructive pulmonary disease: Secondary | ICD-10-CM | POA: Insufficient documentation

## 2012-09-07 DIAGNOSIS — R1313 Dysphagia, pharyngeal phase: Secondary | ICD-10-CM | POA: Insufficient documentation

## 2012-09-07 DIAGNOSIS — M1990 Unspecified osteoarthritis, unspecified site: Secondary | ICD-10-CM | POA: Insufficient documentation

## 2012-09-07 DIAGNOSIS — G809 Cerebral palsy, unspecified: Secondary | ICD-10-CM | POA: Insufficient documentation

## 2012-09-07 DIAGNOSIS — J449 Chronic obstructive pulmonary disease, unspecified: Secondary | ICD-10-CM | POA: Insufficient documentation

## 2012-09-07 DIAGNOSIS — K219 Gastro-esophageal reflux disease without esophagitis: Secondary | ICD-10-CM | POA: Insufficient documentation

## 2012-09-07 NOTE — Procedures (Signed)
Objective Swallowing Evaluation: Modified Barium Swallowing Study  Patient Details  Name: [name] [name] MRN: [id number] Date of Birth: 1946-04-04  Today's Date: 09/07/2012 Time: 4098-1191 SLP Time Calculation (min): 31 min  Past Medical History: No past medical history on file. Past Surgical History: No past surgical history on file. HPI:  Pt is a 66 year old female arriving for an outpatient MBS. Pt has recently had a left lower lobe pna. SLP reports concerns that the pt may be aspirating with cough and wet vocal quality noted with meals. Pt reports some regurgitation of PO. Pt has a history of cerebral palsy since infancy, esopahgeal reflux, oral dysphagia, HTN, recurrent bronchitis, COPD, osteoarthritis, morbid obesity, severe deconditioning.      Assessment / Plan / Recommendation Clinical Impression  Dysphagia Diagnosis: Mild oral phase dysphagia;Mild pharyngeal phase dysphagia Clinical impression: Pt presents with mild discoordination of oral to pharyngeal transit of thin liquids with minimally decreased laryngeal closure resulting in flash/trace penetration. The pt took very large continuous sips and was gasping for breath following trials. Even in these circumstances no significant frank penetration or aspiration occured and no cough response was observed. Esophageal sweep revealed appearance of stasis suggestive of a primary esophageal dysphagia. Pt may also be at risk of laryngophryngeal reflux. At this time recommend pt continue a regular consistency diet with thin liquids with aspiration precautions given COPD and risk of decreased airway closure with respiratory compromise (no straws, small sips). Esophageal precautions also recommended (follow solids with liquids, upright posture, small frequent meals through the day, elevated posture at night) with consideration for an esophageal assessment.     Treatment Recommendation  Defer treatment plan to SLP at (Comment) (SNF)    Diet  Recommendation Regular;Thin liquid   Liquid Administration via: Cup;No straw Medication Administration: Whole meds with liquid Supervision: Patient able to self feed Compensations: Slow rate;Small sips/bites;Follow solids with liquid Postural Changes and/or Swallow Maneuvers: Seated upright 90 degrees;Upright 30-60 min after meal    Other  Recommendations Recommended Consults: Consider GI evaluation;Consider esophageal assessment   Follow Up Recommendations  Skilled Nursing facility    Frequency and Duration        Pertinent Vitals/Pain na    SLP Swallow Goals     General HPI: Pt is a 66 year old female arriving for an outpatient MBS. Pt has recently had a left lower lobe pna. SLP reports concerns that the pt may be aspirating with cough and wet vocal quality noted with meals. Pt reports some regurgitation of PO. Pt has a history of cerebral palsy since infancy, esopahgeal reflux, oral dysphagia, HTN, recurrent bronchitis, COPD, osteoarthritis, morbid obesity, severe deconditioning.  Type of Study: Modified Barium Swallowing Study Reason for Referral: Objectively evaluate swallowing function Diet Prior to this Study: Regular;Thin liquids Temperature Spikes Noted: N/A Respiratory Status: Room air History of Recent Intubation: No Behavior/Cognition: Alert;Cooperative;Pleasant mood Oral Cavity - Dentition: Edentulous Oral Motor / Sensory Function: Within functional limits Self-Feeding Abilities: Able to feed self Patient Positioning: Upright in chair Baseline Vocal Quality: Clear Volitional Cough: Strong Volitional Swallow: Able to elicit Anatomy: Within functional limits Pharyngeal Secretions: Not observed secondary MBS    Reason for Referral Objectively evaluate swallowing function   Oral Phase Oral Preparation/Oral Phase Oral Phase: Impaired Oral - Thin Oral - Thin Cup: Reduced posterior propulsion;Weak lingual manipulation;Left anterior bolus loss Oral - Thin Straw:  Reduced posterior propulsion;Weak lingual manipulation Oral - Solids Oral - Puree: Reduced posterior propulsion;Weak lingual manipulation Oral - Regular: Weak lingual  manipulation Oral - Pill: Reduced posterior propulsion;Holding of bolus;Delayed oral transit   Pharyngeal Phase Pharyngeal Phase Pharyngeal Phase: Impaired Pharyngeal - Thin Pharyngeal - Thin Cup: Penetration/Aspiration during swallow;Reduced airway/laryngeal closure Penetration/Aspiration details (thin cup): Material enters airway, remains ABOVE vocal cords then ejected out Pharyngeal - Thin Straw: Reduced airway/laryngeal closure;Penetration/Aspiration before swallow Penetration/Aspiration details (thin straw): Material enters airway, remains ABOVE vocal cords then ejected out;Material does not enter airway Pharyngeal - Solids Pharyngeal - Puree: Within functional limits Pharyngeal - Regular: Within functional limits Pharyngeal - Pill:  (barium tablet hesitated in the valleculae)  Cervical Esophageal Phase    GO    Cervical Esophageal Phase Cervical Esophageal Phase: Impaired Cervical Esophageal Phase - Comment Cervical Esophageal Comment: Esophageal sweep reveal appearance of diffuse stasis from the proximal esophagus to the LES with approximately 3 bites of puree. With liquid boluses, stasis appeared to partially but no fully clear. Barium tablet appeared to lodge at the GE junction. No radiologist present to confirm.     Functional Assessment Tool Used: clinical judgement Functional Limitations: Swallowing Swallow Current Status (Z6109): At least 20 percent but less than 40 percent impaired, limited or restricted Swallow Discharge Status [phone]): At least 20 percent but less than 40 percent impaired, limited or restricted   [facility], [place] CCC-SLP [phone]  Claudine Mouton 09/07/2012, 1:37 PM

## 2012-09-22 ENCOUNTER — Ambulatory Visit (HOSPITAL_COMMUNITY)
Admission: RE | Admit: 2012-09-22 | Discharge: 2012-09-22 | Disposition: A | Discharge status: Home / Self Care | Payer: PRIVATE HEALTH INSURANCE | Source: Ambulatory Visit | Attending: Internal Medicine | Admitting: Internal Medicine

## 2012-09-22 DIAGNOSIS — R059 Cough, unspecified: Secondary | ICD-10-CM | POA: Insufficient documentation

## 2012-09-22 DIAGNOSIS — R0989 Other specified symptoms and signs involving the circulatory and respiratory systems: Secondary | ICD-10-CM | POA: Insufficient documentation

## 2012-09-22 DIAGNOSIS — Z87891 Personal history of nicotine dependence: Secondary | ICD-10-CM | POA: Insufficient documentation

## 2012-09-22 DIAGNOSIS — R05 Cough: Secondary | ICD-10-CM | POA: Insufficient documentation

## 2012-09-22 DIAGNOSIS — R062 Wheezing: Secondary | ICD-10-CM | POA: Insufficient documentation

## 2012-09-22 DIAGNOSIS — R0602 Shortness of breath: Secondary | ICD-10-CM | POA: Insufficient documentation

## 2012-09-22 DIAGNOSIS — R0609 Other forms of dyspnea: Secondary | ICD-10-CM | POA: Insufficient documentation

## 2012-09-22 DIAGNOSIS — J984 Other disorders of lung: Secondary | ICD-10-CM | POA: Insufficient documentation

## 2012-09-22 MED ORDER — ALBUTEROL SULFATE (5 MG/ML) 0.5% IN NEBU
2.5000 mg | INHALATION_SOLUTION | Freq: Once | RESPIRATORY_TRACT | Status: AC
  Administered 2012-09-22: 2.5 mg via RESPIRATORY_TRACT

## 2013-01-19 DIAGNOSIS — J449 Chronic obstructive pulmonary disease, unspecified: Secondary | ICD-10-CM

## 2013-01-25 ENCOUNTER — Other Ambulatory Visit: Payer: Self-pay | Admitting: *Deleted

## 2013-01-25 MED ORDER — OXYCODONE-ACETAMINOPHEN 5-325 MG PO TABS: ORAL_TABLET | ORAL | Status: DC

## 2013-03-09 DIAGNOSIS — I798 Other disorders of arteries, arterioles and capillaries in diseases classified elsewhere: Secondary | ICD-10-CM

## 2013-03-09 DIAGNOSIS — E1129 Type 2 diabetes mellitus with other diabetic kidney complication: Secondary | ICD-10-CM

## 2013-03-09 DIAGNOSIS — E1159 Type 2 diabetes mellitus with other circulatory complications: Secondary | ICD-10-CM

## 2013-03-09 DIAGNOSIS — E1142 Type 2 diabetes mellitus with diabetic polyneuropathy: Secondary | ICD-10-CM

## 2013-03-29 DIAGNOSIS — I798 Other disorders of arteries, arterioles and capillaries in diseases classified elsewhere: Secondary | ICD-10-CM

## 2013-03-29 DIAGNOSIS — M19019 Primary osteoarthritis, unspecified shoulder: Secondary | ICD-10-CM

## 2013-03-29 DIAGNOSIS — M546 Pain in thoracic spine: Secondary | ICD-10-CM

## 2013-03-29 DIAGNOSIS — E1142 Type 2 diabetes mellitus with diabetic polyneuropathy: Secondary | ICD-10-CM

## 2013-04-30 ENCOUNTER — Other Ambulatory Visit: Payer: Self-pay | Admitting: *Deleted

## 2013-04-30 MED ORDER — OXYCODONE-ACETAMINOPHEN 5-325 MG PO TABS: ORAL_TABLET | ORAL | Status: DC

## 2013-05-04 ENCOUNTER — Non-Acute Institutional Stay (SKILLED_NURSING_FACILITY): Payer: PRIVATE HEALTH INSURANCE | Admitting: Internal Medicine

## 2013-05-04 DIAGNOSIS — J449 Chronic obstructive pulmonary disease, unspecified: Secondary | ICD-10-CM

## 2013-05-04 DIAGNOSIS — E1165 Type 2 diabetes mellitus with hyperglycemia: Secondary | ICD-10-CM

## 2013-05-04 DIAGNOSIS — E1149 Type 2 diabetes mellitus with other diabetic neurological complication: Secondary | ICD-10-CM

## 2013-06-05 NOTE — Progress Notes (Addendum)
Patient ID: [name] [name], female   DOB: Oct 04, 1946, 67 y.o.   MRN: [id number]           PROGRESS NOTE  DATE:  05/04/2013  FACILITY: [facility]    LEVEL OF CARE:   SNF   Routine Visit   CHIEF COMPLAINT:  Routine [facility] visit for the month of June.    HISTORY OF PRESENT ILLNESS:  This is a patient who has been here since February 2012.  Prior to this, she was at [facility] assisted living.  She had been noted at that point to have advanced cerebral palsy with multiple physical disabilities secondary to this.   Significant osteoarthritis.  She has been out to see Orthopedics to have injection in her left shoulder and reports improvement.    PAST MEDICAL HISTORY/PROBLEM LIST:  Advanced cerebral palsy.    Severe osteoarthritis.    Hypertension.    Gastroesophageal reflux disease.    Recurrent bronchitis.    History of recent colitis, exact nature uncertain.      Polyneuropathy and diabetes.    Chronic venous stasis.    CURRENT MEDICATIONS:  Medication list is reviewed.     PHYSICAL EXAMINATION:   CHEST/RESPIRATORY:  Shallow air entry bilaterally.  CARDIOVASCULAR:  CARDIAC:   Heart sounds are normal.  No murmurs.  No signs of congestive heart failure.   EDEMA/VARICOSITIES:  She has significant lower extremity edema secondary to venous stasis and lymphedema.   GASTROINTESTINAL:  ABDOMEN:   Obese, without masses.  Nontender.    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  Hemoglobin A1c 7.9.  On metformin 500 b.i.d.     COPD.    Severe osteoarthritis with recent left shoulder injection.    Hypertension.    The patient does not appear to be unstable.  Recent increase in her Percocet noted.  She has had a diet downgrade to ground.  Lab work is ordered.

## 2013-06-19 ENCOUNTER — Non-Acute Institutional Stay (SKILLED_NURSING_FACILITY): Payer: PRIVATE HEALTH INSURANCE | Admitting: Internal Medicine

## 2013-06-19 DIAGNOSIS — J449 Chronic obstructive pulmonary disease, unspecified: Secondary | ICD-10-CM

## 2013-06-19 DIAGNOSIS — E1149 Type 2 diabetes mellitus with other diabetic neurological complication: Secondary | ICD-10-CM

## 2013-06-19 NOTE — Progress Notes (Signed)
Patient ID: [name] [name], female   DOB: 06/19/1946, 67 y.o.   MRN: [id number]           PROGRESS NOTE  DATE:  06/19/2013  FACILITY: [facility]    LEVEL OF CARE:   SNF   Routine Visit   CHIEF COMPLAINT:  Routine [facility] visit for the month of July. There does not appear to be any new issue    HISTORY OF PRESENT ILLNESS:  This is a patient who has been here since February 2012.  Prior to this, she was at [facility] assisted living.  She had been noted at that point to have advanced cerebral palsy with multiple physical disabilities secondary to this.   Significant osteoarthritis.  She has been out to see Orthopedics to have injection in her left shoulder and reports improvement.    PAST MEDICAL HISTORY/PROBLEM LIST:  Advanced cerebral palsy.    Severe osteoarthritis.    Hypertension.    Gastroesophageal reflux disease.    Recurrent bronchitis.    History of recent colitis, exact nature uncertain.      Polyneuropathy and diabetes.    Chronic venous stasis.    CURRENT MEDICATIONS:  Medication list is reviewed.    Lab work from July 16 that showed an essentially normal CBC. Comprehensive metabolic sample sodium of 141 potassium 4.4 chloride 104 CO2 28 BUN 18 creatinine 0.73 liver function tests are within normal limits. LDL cholesterol was 72 TSH 1.821     PHYSICAL EXAMINATION:   CHEST/RESPIRATORY:  Shallow air entry bilaterally.  CARDIOVASCULAR:  CARDIAC:   Heart sounds are normal.  No murmurs.  No signs of congestive heart failure.   EDEMA/VARICOSITIES:  She has significant lower extremity edema secondary to venous stasis and lymphedema.   GASTROINTESTINAL:  ABDOMEN:   Obese, without masses.  Nontender.    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  Hemoglobin A1c 7.9.  On metformin 500 b.i.d.     COPD.    Severe osteoarthritis with recent left shoulder injection.    Hypertension.    The patient does not appear to be  unstable.

## 2013-07-13 ENCOUNTER — Non-Acute Institutional Stay (SKILLED_NURSING_FACILITY): Payer: PRIVATE HEALTH INSURANCE | Admitting: Internal Medicine

## 2013-07-13 DIAGNOSIS — N3946 Mixed incontinence: Secondary | ICD-10-CM

## 2013-07-13 DIAGNOSIS — E1149 Type 2 diabetes mellitus with other diabetic neurological complication: Secondary | ICD-10-CM

## 2013-07-13 NOTE — Progress Notes (Signed)
Patient ID: [name] [name], female   DOB: [birth year], 67 y.o.   MRN: [id number]            PROGRESS NOTE  DATE:  07/13/2013  FACILITY: [facility]    LEVEL OF CARE:   SNF   Routine Visit   CHIEF COMPLAINT:  Routine [facility] visit for the month of August. There does not appear to be any new issue    HISTORY OF PRESENT ILLNESS:  This is a patient who has been here since February 2012.  Prior to this, she was at [facility] assisted living.  She had been noted at that point to have advanced cerebral palsy with multiple physical disabilities secondary to this.   Significant osteoarthritis.  Has had voiding complaints complaining of not being able to avoid completely. Patient was on Ditropan stopped due to side effects. A postvoid residual urine has been ordered   PAST MEDICAL HISTORY/PROBLEM LIST:  Advanced cerebral palsy.    Severe osteoarthritis.    Hypertension.    Gastroesophageal reflux disease.    Recurrent bronchitis.    History of recent colitis, exact nature uncertain.      Polyneuropathy and diabetes.    Chronic venous stasis.    CURRENT MEDICATIONS:  Medication list is reviewed.    Lab work from July 16 that showed an essentially normal CBC. Comprehensive metabolic sample sodium of 141 potassium 4.4 chloride 104 CO2 28 BUN 18 creatinine 0.73 liver function tests are within normal limits. LDL cholesterol was 72 TSH 1.821     PHYSICAL EXAMINATION:   CHEST/RESPIRATORY:  Shallow air entry bilaterally.  CARDIOVASCULAR:  CARDIAC:   Heart sounds are normal.  No murmurs.  No signs of congestive heart failure.   EDEMA/VARICOSITIES:  She has significant lower extremity edema secondary to venous stasis and lymphedema.   GASTROINTESTINAL:  ABDOMEN:   Obese, without masses.  Nontender.    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  Hemoglobin A1c 7.9.  On metformin 500 b.i.d.     COPD.    Severe osteoarthritis with recent left  shoulder injection.    Hypertension.    Urinary Frequency: A PVR urine was checked  The patient does not appear to be unstable.

## 2013-07-28 ENCOUNTER — Other Ambulatory Visit (HOSPITAL_BASED_OUTPATIENT_CLINIC_OR_DEPARTMENT_OTHER): Payer: Self-pay | Admitting: Internal Medicine

## 2013-07-28 DIAGNOSIS — Z1231 Encounter for screening mammogram for malignant neoplasm of breast: Secondary | ICD-10-CM

## 2013-08-02 ENCOUNTER — Other Ambulatory Visit: Payer: Self-pay | Admitting: *Deleted

## 2013-08-02 MED ORDER — OXYCODONE-ACETAMINOPHEN 5-325 MG PO TABS: ORAL_TABLET | ORAL | Status: DC

## 2013-08-03 ENCOUNTER — Other Ambulatory Visit: Payer: Self-pay | Admitting: *Deleted

## 2013-08-03 MED ORDER — OXYCODONE-ACETAMINOPHEN 5-325 MG PO TABS: ORAL_TABLET | ORAL | Status: DC

## 2013-08-03 NOTE — Telephone Encounter (Signed)
rx signed, faxed.

## 2013-08-06 ENCOUNTER — Ambulatory Visit (HOSPITAL_COMMUNITY)
Admission: RE | Admit: 2013-08-06 | Discharge: 2013-08-06 | Disposition: A | Discharge status: Home / Self Care | Payer: PRIVATE HEALTH INSURANCE | Source: Ambulatory Visit | Attending: Internal Medicine | Admitting: Internal Medicine

## 2013-08-06 DIAGNOSIS — Z1231 Encounter for screening mammogram for malignant neoplasm of breast: Secondary | ICD-10-CM | POA: Insufficient documentation

## 2013-08-18 ENCOUNTER — Non-Acute Institutional Stay (SKILLED_NURSING_FACILITY): Payer: PRIVATE HEALTH INSURANCE | Admitting: Internal Medicine

## 2013-08-18 DIAGNOSIS — E1149 Type 2 diabetes mellitus with other diabetic neurological complication: Secondary | ICD-10-CM

## 2013-08-18 DIAGNOSIS — J449 Chronic obstructive pulmonary disease, unspecified: Secondary | ICD-10-CM

## 2013-08-18 NOTE — Progress Notes (Signed)
Patient ID: [name] [name], female   DOB: August 22, 1946, 67 y.o.   MRN: [id number]            PROGRESS NOTE  DATE:  08/18/2013  FACILITY: [facility]    LEVEL OF CARE:   SNF   Routine Visit   CHIEF COMPLAINT:  Routine [facility] visit for the month of September. There does not appear to be any new issue    HISTORY OF PRESENT ILLNESS:  This is a patient who has been here since February 2012.  Prior to this, she was at [facility] assisted living.  She had been noted at that point to have advanced cerebral palsy with multiple physical disabilities secondary to this.   Significant osteoarthritis.  Has had voiding complaints complaining of not being able to avoid completely. Patient was on Ditropan stopped due to side effects. A postvoid residual urine has been ordered . She continues to complain of urinary urgency. The other issue is planned for right-sided shoulder pain and she has been referred to orthopedics.  PAST MEDICAL HISTORY/PROBLEM LIST:  Advanced cerebral palsy.    Severe osteoarthritis.    Hypertension.    Gastroesophageal reflux disease.    Recurrent bronchitis.    History of recent colitis, exact nature uncertain.      Polyneuropathy and diabetes.    Chronic venous stasis.    CURRENT MEDICATIONS:  Medication list is reviewed.    Lab work from July 16 that showed an essentially normal CBC. Comprehensive metabolic sample sodium of 141 potassium 4.4 chloride 104 CO2 28 BUN 18 creatinine 0.73 liver function tests are within normal limits. LDL cholesterol was 72 TSH 1.821     PHYSICAL EXAMINATION:   CHEST/RESPIRATORY:  Shallow air entry bilaterally.  CARDIOVASCULAR:  CARDIAC:   Heart sounds are normal.  No murmurs.  No signs of congestive heart failure.   EDEMA/VARICOSITIES:  She has significant lower extremity edema secondary to venous stasis and lymphedema.   GASTROINTESTINAL:  ABDOMEN:   Obese, without masses.  Nontender.    ASSESSMENT/PLAN:  Type 2 diabetes  with renal manifestations as well as neuropathy.  Hemoglobin A1c 7.9.  On metformin 500 b.i.d.     COPD.    Severe osteoarthritis with recent left shoulder injection.    Hypertension.    Urinary Frequency: A PVR urine was checked. A urine culture is in order  The patient does not appear to be unstable.

## 2013-09-07 ENCOUNTER — Non-Acute Institutional Stay (SKILLED_NURSING_FACILITY): Payer: PRIVATE HEALTH INSURANCE | Admitting: Internal Medicine

## 2013-09-07 DIAGNOSIS — J449 Chronic obstructive pulmonary disease, unspecified: Secondary | ICD-10-CM

## 2013-09-07 DIAGNOSIS — E1129 Type 2 diabetes mellitus with other diabetic kidney complication: Secondary | ICD-10-CM

## 2013-09-07 DIAGNOSIS — J4489 Other specified chronic obstructive pulmonary disease: Secondary | ICD-10-CM

## 2013-09-07 DIAGNOSIS — E1149 Type 2 diabetes mellitus with other diabetic neurological complication: Secondary | ICD-10-CM

## 2013-09-07 DIAGNOSIS — E1165 Type 2 diabetes mellitus with hyperglycemia: Secondary | ICD-10-CM

## 2013-09-07 NOTE — Progress Notes (Signed)
Patient ID: [name] [name], female   DOB: Jun 28, 1946, 67 y.o.   MRN: [id number]            PROGRESS NOTE  DATE:  08/18/2013  FACILITY: [facility]    LEVEL OF CARE:   SNF   Routine Visit   CHIEF COMPLAINT:  Routine [facility] visit for the month of October. There does not appear to be any new issue    HISTORY OF PRESENT ILLNESS:  This is a patient who has been here since February 2012.  Prior to this, she was at [facility] assisted living.  She had been noted at that point to have advanced cerebral palsy with multiple physical disabilities secondary to this.   Significant osteoarthritis.  Has had voiding complaints complaining of not being able to avoid completely. Patient was on Ditropan stopped due to side effects. A postvoid residual urine has been ordered . She continues to complain of urinary urgency. The other issue is planned for right-sided shoulder pain and she has been referred to orthopedics. She had a left shoulder injection for pain by Dr. Despina Hick. Was treated for an Escherichia coli UTI.  PAST MEDICAL HISTORY/PROBLEM LIST:  Advanced cerebral palsy.    Severe osteoarthritis.    Hypertension.    Gastroesophageal reflux disease.    Recurrent bronchitis.    History of recent colitis, exact nature uncertain.      Polyneuropathy and diabetes.    Chronic venous stasis.    CURRENT MEDICATIONS:  Medication list is reviewed.    Lab work from July 16 that showed an essentially normal CBC. Comprehensive metabolic sample sodium of 141 potassium 4.4 chloride 104 CO2 28 BUN 18 creatinine 0.73 liver function tests are within normal limits. LDL cholesterol was 72 TSH 1.821     PHYSICAL EXAMINATION:   CHEST/RESPIRATORY:  Shallow air entry bilaterally.  CARDIOVASCULAR:  CARDIAC:   Heart sounds are normal.  No murmurs.  No signs of congestive heart failure.   EDEMA/VARICOSITIES:  She has significant lower extremity edema secondary to venous stasis and lymphedema.    GASTROINTESTINAL:  ABDOMEN:   Obese, without masses.  Nontender.    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  Hemoglobin A1c 7.9.  On metformin 500 b.i.d.     COPD.    Severe osteoarthritis with recent left shoulder injection.    Hypertension.    Urinary Frequency: A PVR urine was checked. A urine culture is in order  The patient does not appear to be unstable.

## 2013-10-11 ENCOUNTER — Other Ambulatory Visit: Payer: Self-pay | Admitting: *Deleted

## 2013-10-11 MED ORDER — OXYCODONE-ACETAMINOPHEN 5-325 MG PO TABS: ORAL_TABLET | ORAL | Status: DC

## 2013-10-12 ENCOUNTER — Non-Acute Institutional Stay (SKILLED_NURSING_FACILITY): Payer: PRIVATE HEALTH INSURANCE | Admitting: Internal Medicine

## 2013-10-12 DIAGNOSIS — J449 Chronic obstructive pulmonary disease, unspecified: Secondary | ICD-10-CM

## 2013-10-12 DIAGNOSIS — E1149 Type 2 diabetes mellitus with other diabetic neurological complication: Secondary | ICD-10-CM

## 2013-10-12 NOTE — Progress Notes (Signed)
Patient ID: [name] [name], female   DOB: [birth year], 67 y.o.   MRN: [id number]            PROGRESS NOTE  DATE:  10/13/2039  FACILITY: [facility]    LEVEL OF CARE:   SNF   Routine Visit   CHIEF COMPLAINT:  Routine [employer] visit for note to. There does not appear to be any new issue    HISTORY OF PRESENT ILLNESS:  This is a patient who has been here since February 2012.  Prior to this, she was at [facility] assisted living.  She had been noted at that point to have advanced cerebral palsy with multiple physical disabilities secondary to this.   Significant osteoarthritis.  Has had voiding complaints complaining of not being able to void completely. Patient was on Ditropan stopped due to side effects. A postvoid residual urine has been ordered . She continues to complain of urinary urgency. The other issue is planned for right-sided shoulder pain and she has been referred to orthopedics. She had a left shoulder injection for pain by Dr. Despina Hick. Was treated for an Escherichia coli UTI.  PAST MEDICAL HISTORY/PROBLEM LIST:  Advanced cerebral palsy.    Severe osteoarthritis.    Hypertension.    Gastroesophageal reflux disease.    Recurrent bronchitis.    History of recent colitis, exact nature uncertain.      Polyneuropathy and diabetes.    Chronic venous stasis.    CURRENT MEDICATIONS:  Medication list is reviewed.    Lab work from July 16 that showed an essentially normal CBC. Comprehensive metabolic sample sodium of 141 potassium 4.4 chloride 104 CO2 28 BUN 18 creatinine 0.73 liver function tests are within normal limits. LDL cholesterol was 72 TSH 1.821     PHYSICAL EXAMINATION:   CHEST/RESPIRATORY:  Shallow air entry bilaterally.  CARDIOVASCULAR:  CARDIAC:   Heart sounds are normal.  No murmurs.  No signs of congestive heart failure.   EDEMA/VARICOSITIES:  She has significant lower extremity edema secondary to venous stasis and lymphedema.   GASTROINTESTINAL:  ABDOMEN:    Obese, without masses.  Nontender.    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  Hemoglobin A1c 7.9.  On metformin 500 b.i.d.     COPD.    Severe osteoarthritis with recent left shoulder injection.    Hypertension.    Urinary Frequency: A PVR urine was checked. A urine culture is in order  The patient does not appear to be unstable.

## 2013-11-14 ENCOUNTER — Non-Acute Institutional Stay (SKILLED_NURSING_FACILITY): Payer: PRIVATE HEALTH INSURANCE | Admitting: Internal Medicine

## 2013-11-14 DIAGNOSIS — N3946 Mixed incontinence: Secondary | ICD-10-CM

## 2013-11-14 DIAGNOSIS — E1149 Type 2 diabetes mellitus with other diabetic neurological complication: Secondary | ICD-10-CM

## 2013-11-14 NOTE — Progress Notes (Signed)
Patient ID: [name] [name], female   DOB: 02/27/1946, 69 y.o.   MRN: [id number]            PROGRESS NOTE  DATE:  11/12/2013  FACILITY: [facility]    LEVEL OF CARE:   SNF   Routine Visit   CHIEF COMPLAINT:  Routine [employer] visit for note for. There does not appear to be any new issue    HISTORY OF PRESENT ILLNESS:  This is a patient who has been here since February 2012.  Prior to this, she was at [facility] assisted living.  She had been noted at that point to have advanced cerebral palsy with multiple physical disabilities secondary to this.   Significant osteoarthritis.  Has had voiding complaints complaining of not being able to void completely. Patient was on Ditropan stopped due to side effects. A postvoid residual urine has been ordered . She continues to complain of urinary urgency. The other issue is planned for right-sided shoulder pain and she has been referred to orthopedics. She had a left shoulder injection for pain by Dr. Maureen Ralphs. Was treated for an Escherichia coli UTI.  PAST MEDICAL HISTORY/PROBLEM LIST:  Advanced cerebral palsy.    Severe osteoarthritis.    Hypertension.    Gastroesophageal reflux disease.    Recurrent bronchitis.    History of recent colitis, exact nature uncertain.      Polyneuropathy and diabetes.    Chronic venous stasis.    CURRENT MEDICATIONS:  Medication list is reviewed.    Lab work from July 16 that showed an essentially normal CBC. Comprehensive metabolic sample sodium of 141 potassium 4.4 chloride 104 CO2 28 BUN 18 creatinine 0.73 liver function tests are within normal limits. LDL cholesterol was 72 TSH 1.821     PHYSICAL EXAMINATION:  Temperature 90.7 pulse 94 respirations 18 blood pressure 136/80 weight 226 pounds O2 sat 97% on room air CHEST/RESPIRATORY:  Shallow air entry bilaterally.  CARDIOVASCULAR:  CARDIAC:   Heart sounds are normal.  No murmurs.  No signs of congestive heart failure.   EDEMA/VARICOSITIES:  She has  significant lower extremity edema secondary to venous stasis and lymphedema.   GASTROINTESTINAL:  ABDOMEN:   Obese, without masses.  Nontender.    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  Hemoglobin A1c 7.9.  On metformin 500 b.i.d.     COPD.    Severe osteoarthritis with recent left shoulder injection.    Hypertension.    Urinary Frequency: A PVR urine was checked. A urine culture is in order  The patient does not appear to be unstable.

## 2013-12-09 ENCOUNTER — Other Ambulatory Visit: Payer: Self-pay | Admitting: *Deleted

## 2013-12-09 MED ORDER — OXYCODONE-ACETAMINOPHEN 5-325 MG PO TABS: ORAL_TABLET | ORAL | Status: DC

## 2013-12-09 NOTE — Telephone Encounter (Signed)
[facility] 

## 2013-12-14 ENCOUNTER — Other Ambulatory Visit: Payer: Self-pay

## 2013-12-14 MED ORDER — OXYCODONE-ACETAMINOPHEN 7.5-325 MG PO TABS: ORAL_TABLET | ORAL | Status: DC

## 2013-12-14 NOTE — Telephone Encounter (Signed)
Rx faxed to [facility] @ [phone], phone number [phone]  

## 2014-01-03 ENCOUNTER — Other Ambulatory Visit: Payer: Self-pay | Admitting: *Deleted

## 2014-01-03 MED ORDER — OXYCODONE-ACETAMINOPHEN 7.5-325 MG PO TABS: ORAL_TABLET | ORAL | Status: DC

## 2014-01-03 NOTE — Telephone Encounter (Signed)
[facility] 

## 2014-01-05 ENCOUNTER — Encounter (HOSPITAL_COMMUNITY): Payer: Self-pay | Admitting: Emergency Medicine

## 2014-01-05 ENCOUNTER — Emergency Department (HOSPITAL_COMMUNITY)
Admission: EM | Admit: 2014-01-05 | Discharge: 2014-01-05 | Disposition: A | Discharge status: Home / Self Care | Payer: Medicare Other | Attending: Emergency Medicine | Admitting: Emergency Medicine

## 2014-01-05 DIAGNOSIS — Z0000 Encounter for general adult medical examination without abnormal findings: Secondary | ICD-10-CM

## 2014-01-05 DIAGNOSIS — Z711 Person with feared health complaint in whom no diagnosis is made: Secondary | ICD-10-CM | POA: Insufficient documentation

## 2014-01-05 DIAGNOSIS — E119 Type 2 diabetes mellitus without complications: Secondary | ICD-10-CM | POA: Insufficient documentation

## 2014-01-05 DIAGNOSIS — N183 Chronic kidney disease, stage 3 unspecified: Secondary | ICD-10-CM | POA: Insufficient documentation

## 2014-01-05 DIAGNOSIS — Z8669 Personal history of other diseases of the nervous system and sense organs: Secondary | ICD-10-CM | POA: Insufficient documentation

## 2014-01-05 DIAGNOSIS — R609 Edema, unspecified: Secondary | ICD-10-CM | POA: Insufficient documentation

## 2014-01-05 DIAGNOSIS — I129 Hypertensive chronic kidney disease with stage 1 through stage 4 chronic kidney disease, or unspecified chronic kidney disease: Secondary | ICD-10-CM | POA: Insufficient documentation

## 2014-01-05 HISTORY — DX: Bronchitis, not specified as acute or chronic: J40

## 2014-01-05 HISTORY — DX: Hypertensive heart disease without heart failure: I11.9

## 2014-01-05 HISTORY — DX: Polyneuropathy, unspecified: G62.9

## 2014-01-05 HISTORY — DX: Cerebral palsy, unspecified: G80.9

## 2014-01-05 HISTORY — DX: Chronic kidney disease, unspecified: N18.9

## 2014-01-05 HISTORY — DX: Gout, unspecified: M10.9

## 2014-01-05 HISTORY — DX: Unspecified osteoarthritis, unspecified site: M19.90

## 2014-01-05 HISTORY — DX: Pure hypercholesterolemia, unspecified: E78.00

## 2014-01-05 HISTORY — DX: Gastro-esophageal reflux disease without esophagitis: K21.9

## 2014-01-05 HISTORY — DX: Unspecified glaucoma: H40.9

## 2014-01-05 HISTORY — DX: Peripheral vascular disease, unspecified: I73.9

## 2014-01-05 LAB — I-STAT CHEM 8, ED
BUN: 19 mg/dL (ref 6–23)
CHLORIDE: 99 meq/L (ref 96–112)
CREATININE: 1 mg/dL (ref 0.50–1.10)
Calcium, Ion: 1.3 mmol/L (ref 1.13–1.30)
Glucose, Bld: 105 mg/dL — ABNORMAL HIGH (ref 70–99)
HCT: 46 % (ref 36.0–46.0)
Hemoglobin: 15.6 g/dL — ABNORMAL HIGH (ref 12.0–15.0)
POTASSIUM: 4.2 meq/L (ref 3.7–5.3)
SODIUM: 140 meq/L (ref 137–147)
TCO2: 28 mmol/L (ref 0–100)

## 2014-01-05 LAB — CBC WITH DIFFERENTIAL/PLATELET
BASOS PCT: 1 % (ref 0–1)
Basophils Absolute: 0 10*3/uL (ref 0.0–0.1)
EOS ABS: 0.7 10*3/uL (ref 0.0–0.7)
EOS PCT: 8 % — AB (ref 0–5)
HCT: 41.9 % (ref 36.0–46.0)
HEMOGLOBIN: 13.6 g/dL (ref 12.0–15.0)
LYMPHS ABS: 2.3 10*3/uL (ref 0.7–4.0)
Lymphocytes Relative: 26 % (ref 12–46)
MCH: 29.6 pg (ref 26.0–34.0)
MCHC: 32.5 g/dL (ref 30.0–36.0)
MCV: 91.3 fL (ref 78.0–100.0)
MONO ABS: 1.1 10*3/uL — AB (ref 0.1–1.0)
MONOS PCT: 13 % — AB (ref 3–12)
NEUTROS PCT: 52 % (ref 43–77)
Neutro Abs: 4.6 10*3/uL (ref 1.7–7.7)
Platelets: 279 10*3/uL (ref 150–400)
RBC: 4.59 MIL/uL (ref 3.87–5.11)
RDW: 15.4 % (ref 11.5–15.5)
WBC: 8.9 10*3/uL (ref 4.0–10.5)

## 2014-01-05 LAB — COMPREHENSIVE METABOLIC PANEL
ALBUMIN: 3.9 g/dL (ref 3.5–5.2)
ALT: 20 U/L (ref 0–35)
AST: 23 U/L (ref 0–37)
Alkaline Phosphatase: 73 U/L (ref 39–117)
BUN: 18 mg/dL (ref 6–23)
CALCIUM: 10.4 mg/dL (ref 8.4–10.5)
CO2: 26 mEq/L (ref 19–32)
CREATININE: 0.83 mg/dL (ref 0.50–1.10)
Chloride: 97 mEq/L (ref 96–112)
GFR calc non Af Amer: 72 mL/min — ABNORMAL LOW (ref >90)
GFR, EST AFRICAN AMERICAN: 84 mL/min — AB (ref >90)
GLUCOSE: 108 mg/dL — AB (ref 70–99)
POTASSIUM: 4.3 meq/L (ref 3.7–5.3)
Sodium: 138 mEq/L (ref 137–147)
TOTAL PROTEIN: 7.1 g/dL (ref 6.0–8.3)
Total Bilirubin: 0.3 mg/dL (ref 0.3–1.2)

## 2014-01-05 NOTE — ED Notes (Addendum)
Pt is from [facility]. Pt sent to ED with order that reads "send to [facility] For evaluation of EKG changes and hyperkalemia." Lab value reported to facility is ">7.5," pt is taking oral potassium at the facility. Pt is A/O x4 and has no complaints.

## 2014-01-05 NOTE — ED Provider Notes (Signed)
CSN: [id number]     Arrival date & time 01/05/14  1702 History   First MD Initiated Contact with Patient 01/05/14 1710     Chief Complaint  Patient presents with  . Abnormal Lab     (Consider location/radiation/quality/duration/timing/severity/associated sxs/prior Treatment) HPI [name] [name] is a 68 y.o. female with a history of cerebral palsy, hypertension, diabetes, stage III kidney disease, presents to emergency department from [facility] facility with reported potassium lab value greater than 7.5 that was drawn 2 days ago. Nursing home staff also found the patient to have "EKG changes." Patient denies any complaints. She has no history of hyperkalemia. She has not had any recent medications changes. She denies any pain other than chronic pain in her back. She denies any chest pain or shortness of breath. Patient is not on dialysis.  No past medical history on file. No past surgical history on file. No family history on file. History  Substance Use Topics  . Smoking status: Not on file  . Smokeless tobacco: Not on file  . Alcohol Use: Not on file   OB History   No data available     Review of Systems  Respiratory: Negative for chest tightness and shortness of breath.   Cardiovascular: Negative for chest pain and palpitations.  Gastrointestinal: Negative for nausea, vomiting, abdominal pain and diarrhea.  Musculoskeletal: Negative for arthralgias and myalgias.  Skin: Negative for rash.  Neurological: Negative for dizziness, weakness and headaches.  All other systems reviewed and are negative.      Allergies  Review of patient's allergies indicates no known allergies.  Home Medications   Current Outpatient Rx  Name  Route  Sig  Dispense  Refill  . oxyCODONE-acetaminophen (PERCOCET) 7.5-325 MG per tablet      Take one tablet by mouth twice daily; Take one tablet by mouth every 6 hours as needed for pain   180 tablet   0    BP 115/97  Pulse 99   Temp(Src) 98.6 F (37 C) (Oral)  Resp 14  SpO2 93% Physical Exam  Nursing note and vitals reviewed. Constitutional: She appears well-developed and well-nourished. No distress.  HENT:  Head: Normocephalic.  Eyes: Conjunctivae are normal.  Neck: Neck supple.  Cardiovascular: Normal rate, regular rhythm and normal heart sounds.   Pulmonary/Chest: Effort normal and breath sounds normal. No respiratory distress. She has no wheezes. She has no rales.  Abdominal: Soft. Bowel sounds are normal. She exhibits no distension. There is no tenderness. There is no rebound.  Musculoskeletal: She exhibits edema.  Neurological: She is alert.  Skin: Skin is warm and dry.  Psychiatric: She has a normal mood and affect. Her behavior is normal.    ED Course  Procedures (including critical care time) Labs Review Labs Reviewed  CBC WITH DIFFERENTIAL - Abnormal; Notable for the following:    Monocytes Relative 13 (*)    Monocytes Absolute 1.1 (*)    Eosinophils Relative 8 (*)    All other components within normal limits  COMPREHENSIVE METABOLIC PANEL - Abnormal; Notable for the following:    Glucose, Bld 108 (*)    GFR calc non Af Amer 72 (*)    GFR calc Af Amer 84 (*)    All other components within normal limits  I-STAT CHEM 8, ED - Abnormal; Notable for the following:    Glucose, Bld 105 (*)    Hemoglobin 15.6 (*)    All other components within normal limits   Imaging Review  No results found.   EKG Interpretation   Date/Time:  Wednesday January 05 2014 17:09:58 EDT Ventricular Rate:  97 PR Interval:  222 QRS Duration: 74 QT Interval:  338 QTC Calculation: 429 R Axis:   7 Text Interpretation:  Normal sinus rhythm with 1st degree A-V block Low  voltage QRS When compared with ECG of 12/17/2010 No significant change was  found Confirmed by [facility]  MD, Nunzio Cory [phone]) on 01/05/2014 5:19:48 PM      MDM   Final diagnoses:  Normal physical examination   Patient sent to emergency  department for potassium greater than 7.5. Upon arrival EKG obtained, no peaked T waves seen. Patient is nontoxic appearing, vital signs are normal, she does not have any complaints. We'll recheck blood work.  6:42 PM Patient is a potassium recheck of 4.3. Her blood work is all unremarkable. Her renal function is normal. I suspect that the sample at the nursing home is most likely hemolyzed. At this time patient is stable, normal vital signs, normal labs, she will be discharged back to the assisted-living facility.  Filed Vitals:   01/05/14 1705  BP: 115/97  Pulse: 99  Temp: 98.6 F (37 C)  Resp: 14       Jeancarlos Marchena A Nickol Collister, PA-C 01/05/14 1845

## 2014-01-05 NOTE — Discharge Instructions (Signed)
Recheck of potassium in emergency department is 4.3. EKG shows no changes that are concerning for hyperkalemia. The rest of the bloodwork is normal. Patient is to followup with her primary care doctor as needed.

## 2014-01-07 NOTE — ED Provider Notes (Signed)
Medical screening examination/treatment/procedure(s) were performed by non-physician practitioner and as supervising physician I was immediately available for consultation/collaboration.   EKG Interpretation   Date/Time:  Wednesday January 05 2014 17:09:58 EDT Ventricular Rate:  97 PR Interval:  222 QRS Duration: 74 QT Interval:  338 QTC Calculation: 429 R Axis:   7 Text Interpretation:  Normal sinus rhythm with 1st degree A-V block Low  voltage QRS When compared with ECG of 12/17/2010 No significant change was  found Confirmed by [facility]  MD, Bridie Colquhoun ([id number]) on 01/05/2014 5:19:48 PM        Alfonzo Feller, DO 01/07/14 1430

## 2014-01-13 ENCOUNTER — Non-Acute Institutional Stay (SKILLED_NURSING_FACILITY): Payer: PRIVATE HEALTH INSURANCE | Admitting: Internal Medicine

## 2014-01-13 DIAGNOSIS — N3946 Mixed incontinence: Secondary | ICD-10-CM

## 2014-01-13 DIAGNOSIS — E1149 Type 2 diabetes mellitus with other diabetic neurological complication: Secondary | ICD-10-CM

## 2014-01-13 NOTE — Progress Notes (Signed)
Patient ID: [name] [name], female   DOB: 08/05/1946, 68 y.o.   MRN: [id number]            PROGRESS NOTE  DATE:  01/11/14  FACILITY: [facility]    LEVEL OF CARE:   SNF   Routine Visit   CHIEF COMPLAINT:  Routine [employer] visit for note for February. Patient has been followed for minor issues including Fungal rash under both breasts. She complained some of the low back pain. Urine culture was done due to this and the feeling of incomplete bladder emptying. She was also sent to the ER for a potassium that was found to be over 7. As she was on medications that could have contributed this was felt necessary to rule this out acutely however the potassium was found to be well within the normal range.  HISTORY OF PRESENT ILLNESS:  This is a patient who has been here since February 2012.  Prior to this, she was at [facility] assisted living.  She had been noted at that point to have advanced cerebral palsy with multiple physical disabilities secondary to this.   PAST MEDICAL HISTORY/PROBLEM LIST:  Advanced cerebral palsy.    Severe osteoarthritis.    Hypertension.    Gastroesophageal reflux disease.    Recurrent bronchitis.    History of recent colitis, exact nature uncertain.      Polyneuropathy and diabetes.    Chronic venous stasis.    CURRENT MEDICATIONS:  Medication list is reviewed.    Lab work from July 16 that showed an essentially normal CBC. Comprehensive metabolic sample sodium of 141 potassium 4.4 chloride 104 CO2 28 BUN 18 creatinine 0.73 liver function tests are within normal limits. LDL cholesterol was 72 TSH 1.821     PHYSICAL EXAMINATION:  Temperature 97.8-pulse 66-respirations 17-blood pressure 136/90 weight is 254 pounds O2 sat 95% on room air CHEST/RESPIRATORY:  Shallow air entry bilaterally.  CARDIOVASCULAR:  CARDIAC:   Heart sounds are normal.  No murmurs.  No signs of congestive heart failure.   EDEMA/VARICOSITIES:  She has significant lower extremity edema  secondary to venous stasis and lymphedema.   GASTROINTESTINAL:  ABDOMEN:   Obese, without masses.  Nontender.    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  Hemoglobin A1c 7.9.  On metformin 500 b.i.d.     COPD.    Severe osteoarthritis with recent left shoulder injection.    Hypertension.    Urinary Frequency  Gout on allopurinol  Asthma; this is stable  The patient does not appear to be unstable.

## 2014-01-20 ENCOUNTER — Other Ambulatory Visit: Payer: Self-pay | Admitting: *Deleted

## 2014-01-20 MED ORDER — OXYCODONE-ACETAMINOPHEN 7.5-325 MG PO TABS: ORAL_TABLET | ORAL | Status: DC

## 2014-01-20 NOTE — Telephone Encounter (Signed)
[facility] 

## 2014-02-16 ENCOUNTER — Non-Acute Institutional Stay (SKILLED_NURSING_FACILITY): Payer: PRIVATE HEALTH INSURANCE | Admitting: Internal Medicine

## 2014-02-16 DIAGNOSIS — J449 Chronic obstructive pulmonary disease, unspecified: Secondary | ICD-10-CM

## 2014-02-16 DIAGNOSIS — E1149 Type 2 diabetes mellitus with other diabetic neurological complication: Secondary | ICD-10-CM

## 2014-02-16 NOTE — Progress Notes (Signed)
Patient ID: [name] [name], female   DOB: 05-Jul-1946, 68 y.o.   MRN: [id number]            PROGRESS NOTE  DATE:  02/16/2014  FACILITY: [facility]    LEVEL OF CARE:   SNF   Routine Visit   CHIEF COMPLAINT:  Routine [employer] visit for note for March. Patient has been followed for minor issues including Fungal rash under both breasts. She complained some of the low back pain. Urine culture was done due to this and the feeling of incomplete bladder emptying. She was also sent to the ER for a potassium that was found to be over 7. As she was on medications that could have contributed this was felt necessary to rule this out acutely however the potassium was found to be well within the normal range.  HISTORY OF PRESENT ILLNESS:  This is a patient who has been here since February 2012.  Prior to this, she was at [facility] assisted living.  She had been noted at that point to have advanced cerebral palsy with multiple physical disabilities secondary to this.   PAST MEDICAL HISTORY/PROBLEM LIST:  Advanced cerebral palsy.    Severe osteoarthritis.    Hypertension.    Gastroesophageal reflux disease.    Recurrent bronchitis.    History of recent colitis, exact nature uncertain.      Polyneuropathy and diabetes.    Chronic venous stasis.    CURRENT MEDICATIONS:  Medication list is reviewed.    Lab work from July 16 that showed an essentially normal CBC. Comprehensive metabolic sample sodium of 141 potassium 4.4 chloride 104 CO2 28 BUN 18 creatinine 0.73 liver function tests are within normal limits. LDL cholesterol was 72 TSH 1.821     PHYSICAL EXAMINATION:  Temperature 96.6-pulse 62-respirations 16-blood pressure 141/62 CHEST/RESPIRATORY:  Shallow air entry bilaterally.  CARDIOVASCULAR:  CARDIAC:   Heart sounds are normal.  No murmurs.  No signs of congestive heart failure.   EDEMA/VARICOSITIES:  She has significant lower extremity edema secondary to venous stasis and lymphedema.    GASTROINTESTINAL:  ABDOMEN:   Obese, without masses.  Nontender.    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  Hemoglobin A1c 7.9.  On metformin 500 b.i.d.     COPD.    Severe osteoarthritis with recent left shoulder injection.    Hypertension.    Urinary Frequency  Gout on allopurinol  Asthma; this is stable  The patient does not appear to be unstable.

## 2014-03-22 ENCOUNTER — Non-Acute Institutional Stay (SKILLED_NURSING_FACILITY): Payer: PRIVATE HEALTH INSURANCE | Admitting: Internal Medicine

## 2014-03-22 DIAGNOSIS — J449 Chronic obstructive pulmonary disease, unspecified: Secondary | ICD-10-CM

## 2014-03-22 DIAGNOSIS — G808 Other cerebral palsy: Secondary | ICD-10-CM

## 2014-03-22 DIAGNOSIS — E1149 Type 2 diabetes mellitus with other diabetic neurological complication: Secondary | ICD-10-CM

## 2014-03-25 NOTE — Progress Notes (Addendum)
Patient ID: [name] [name], female   DOB: June 19, 1946, 68 y.o.   MRN: [id number]                  PROGRESS NOTE  DATE:  03/22/2014    FACILITY:  [facility]    LEVEL OF CARE:   SNF   Routine Visit   CHIEF COMPLAINT:  Routine visit to follow medical issues/[employer] visit for April.    HISTORY OF PRESENT ILLNESS:   This is a patient whose primary disability is secondary to cerebral palsy.  Nevertheless, she is very functional in the facility, pushes herself around in her electric wheelchair.    She eats 50-75% of her meals.    She was seen by Psychiatry this month, who did not place her on any new medications.    She went to see [facility] and  apparently had a hearing test and is being assessed for hearing aids.    She requires some level of assistance with all her ADLs.  She is still able to stand and pivot transfer.    PAST MEDICAL HISTORY/PROBLEM LIST:  Cerebral palsy.    Osteoarthritis.    Hypertension.      Gastroesophageal reflux disease.     Recurrent bronchitis.    Colitis, exact nature uncertain.    Type 2 diabetes with polyneuropathy.    Chronic venous stasis.    Obesity.    CURRENT MEDICATIONS:  Medication list is reviewed.     Allopurinol 300 a day.    Lasix 40 q.d.    Lotensin 30 mg a day.     Nexium 40 q.d.    Claritin 10 q.d.    Patanol 0.1%, 1 drop to each eye daily.    Ditropan XL 10 q.d.    Naprosyn 500 b.i.d.    Percocet 7.5/325 p.r.n.     Flexeril 10 mg three times a day.    Gabapentin 100 three times a day.    Zocor 10 q.d.    Lidoderm 5% patch to lower back.    K-dur 20 mEq twice a day.    Calcium carbonate 500 b.i.d.    Mucinex 600 b.i.d.    Advair Diskus 100/50 b.i.d.    PHYSICAL EXAMINATION:   VITAL SIGNS:   TEMPERATURE:  97.3.   PULSE:  94.    RESPIRATIONS:  18.   BLOOD PRESSURE:  97/66.   WEIGHT:  220 pounds.   O2 SATURATIONS:   97% on room air.   CHEST/RESPIRATORY:  Reduced air entry bilaterally.  No  obvious pulmonary distress.   CARDIOVASCULAR:  CARDIAC:   Heart sounds are normal.  There are no murmurs.  She appears to be euvolemic.   EDEMA/VARICOSITIES:  She has some degree of lymphedema and venous stasis.   GASTROINTESTINAL:  ABDOMEN:   Obese, without masses.  Nontender.    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  Last hemoglobin A1c was 6.6 on 04/29/20105.    COPD.  This appears to be stable.    Severe osteoarthritis.  Shoulders and knees.    History of gout.  This has not been unstable.    Cerebral palsy.  She is quite disabled, but still functional in her wheelchair in the facility.

## 2014-04-04 ENCOUNTER — Encounter: Payer: Self-pay | Admitting: Cardiology

## 2014-04-04 DIAGNOSIS — E7800 Pure hypercholesterolemia, unspecified: Secondary | ICD-10-CM | POA: Insufficient documentation

## 2014-04-04 DIAGNOSIS — H409 Unspecified glaucoma: Secondary | ICD-10-CM | POA: Insufficient documentation

## 2014-04-04 DIAGNOSIS — E1142 Type 2 diabetes mellitus with diabetic polyneuropathy: Secondary | ICD-10-CM | POA: Insufficient documentation

## 2014-04-04 DIAGNOSIS — I119 Hypertensive heart disease without heart failure: Secondary | ICD-10-CM | POA: Insufficient documentation

## 2014-04-04 DIAGNOSIS — M1990 Unspecified osteoarthritis, unspecified site: Secondary | ICD-10-CM | POA: Insufficient documentation

## 2014-04-04 DIAGNOSIS — G809 Cerebral palsy, unspecified: Secondary | ICD-10-CM | POA: Insufficient documentation

## 2014-04-04 DIAGNOSIS — N189 Chronic kidney disease, unspecified: Secondary | ICD-10-CM | POA: Insufficient documentation

## 2014-04-04 DIAGNOSIS — I739 Peripheral vascular disease, unspecified: Secondary | ICD-10-CM | POA: Insufficient documentation

## 2014-04-04 DIAGNOSIS — K219 Gastro-esophageal reflux disease without esophagitis: Secondary | ICD-10-CM | POA: Insufficient documentation

## 2014-04-04 DIAGNOSIS — G629 Polyneuropathy, unspecified: Secondary | ICD-10-CM | POA: Insufficient documentation

## 2014-04-04 DIAGNOSIS — R0602 Shortness of breath: Secondary | ICD-10-CM | POA: Insufficient documentation

## 2014-04-04 DIAGNOSIS — M109 Gout, unspecified: Secondary | ICD-10-CM | POA: Insufficient documentation

## 2014-04-06 ENCOUNTER — Encounter: Payer: Self-pay | Admitting: Cardiology

## 2014-04-06 ENCOUNTER — Ambulatory Visit (INDEPENDENT_AMBULATORY_CARE_PROVIDER_SITE_OTHER): Payer: PRIVATE HEALTH INSURANCE | Admitting: Cardiology

## 2014-04-06 VITALS — BP 126/68 | HR 99

## 2014-04-06 DIAGNOSIS — I509 Heart failure, unspecified: Secondary | ICD-10-CM

## 2014-04-06 DIAGNOSIS — R0902 Hypoxemia: Secondary | ICD-10-CM

## 2014-04-06 DIAGNOSIS — R0602 Shortness of breath: Secondary | ICD-10-CM

## 2014-04-06 DIAGNOSIS — E7800 Pure hypercholesterolemia, unspecified: Secondary | ICD-10-CM

## 2014-04-06 DIAGNOSIS — N189 Chronic kidney disease, unspecified: Secondary | ICD-10-CM

## 2014-04-06 NOTE — Patient Instructions (Addendum)
Your physician has recommended you make the following change in your medication: increase Furosemide 40 mg to twice daily for 3 days then decrease back to 40 mg daily   Dr Ron Parker recommends that you decrease you fluid intake and limit the amount of salt in your diet.  Your physician has requested that you have an echocardiogram. Echocardiography is a painless test that uses sound waves to create images of your heart. It provides your doctor with information about the size and shape of your heart and how well your heart's chambers and valves are working. This procedure takes approximately one hour. There are no restrictions for this procedure. To be done on same day as your next office visit with Dr Ron Parker.  Your physician recommends that you schedule a follow-up appointment in: 6 weeks

## 2014-04-06 NOTE — Assessment & Plan Note (Signed)
The patient has an O2 sat of 92%. There are rales on physical exam. I suspect that her shortness of breath is related to congestive heart failure. I cannot state at this time whether this is systolic or diastolic. I plan to increase her Lasix to twice daily for 3 days. At the same time we wanted to significantly decrease her fluid and salt intake. The patient will not do well if she has to be moved a great deal to urinate. It will be best if we could limit her fluid intake. She needs a two-dimensional echo. I'm hesitant to bring her to the office for this procedure only. Therefore I will scheduled to be done on the day that I see her for followup.

## 2014-04-06 NOTE — Assessment & Plan Note (Signed)
The patient has resting hypoxia. We will request that her O2 sat be checked again after a few days of extra Lasix. If it is below 94%, I will recommend that she be started on oxygen if she is assessed to be sure that she does not retain CO2.

## 2014-04-06 NOTE — Assessment & Plan Note (Signed)
Patient's lipids are being treated. 

## 2014-04-06 NOTE — Assessment & Plan Note (Signed)
Historically there is some renal insufficiency. Her labs are follow at the nursing home.

## 2014-04-06 NOTE — Progress Notes (Signed)
Patient ID: [name] [name], female   DOB: 04-Aug-1946, 68 y.o.   MRN: [id number]    HPI  The patient is seen today for cardiology consultation and to establish as a new patient. She is referred for the evaluation of shortness of breath. The patient comes from a nursing home. I have reviewed the records that are sent. I've also reviewed information in the current electronic medical record. I've spoken with the patient and the worker who brought her from the nursing home who does know the patient. The patient has shortness of breath. This is both exertional and sometimes at rest. She is significantly overweight. She has difficulty moving and therefore requires a bedpan frequently to urinate. She's not having any significant chest pain. EKG from March, 2015 revealed no diagnostic abnormalities. She had a chest x-ray earlier this year revealing cardiomegaly. She has a history of mild renal insufficiency. She does take Lasix 40 mg daily. She does admit that she drinks a large amount of water during the day.  No Known Allergies  Current Outpatient Prescriptions  Medication Sig Dispense Refill  . acetaminophen (TYLENOL) 500 MG tablet Take 500 mg by mouth every 6 (six) hours as needed (PAIN).      Marland Kitchen albuterol (PROVENTIL) (2.5 MG/3ML) 0.083% nebulizer solution Take 2.5 mg by nebulization every 4 (four) hours as needed for wheezing or shortness of breath (WHEEZING).      Marland Kitchen allopurinol (ZYLOPRIM) 300 MG tablet Take 300 mg by mouth daily.      . benazepril (LOTENSIN) 10 MG tablet Take 10 mg by mouth daily.      . benazepril (LOTENSIN) 20 MG tablet Take 20 mg by mouth daily.      . benzonatate (TESSALON) 100 MG capsule Take by mouth 3 (three) times daily as needed for cough.      . calcium-vitamin D (OSCAL WITH D) 500-200 MG-UNIT per tablet Take 1 tablet by mouth 2 (two) times daily.      . cyclobenzaprine (FLEXERIL) 10 MG tablet Take 10 mg by mouth 3 (three) times daily as needed for muscle spasms (MUSCLE SPASMS).       Marland Kitchen dextromethorphan (DELSYM) 30 MG/5ML liquid Take by mouth at bedtime as needed for cough.      . ergocalciferol (VITAMIN D2) 50000 UNITS capsule Take 50,000 Units by mouth every 30 (thirty) days.      Marland Kitchen esomeprazole (NEXIUM) 40 MG capsule Take 40 mg by mouth daily at 12 noon.      . Fluticasone-Salmeterol (ADVAIR) 100-50 MCG/DOSE AEPB Inhale 1 puff into the lungs 2 (two) times daily.      . furosemide (LASIX) 40 MG tablet Take 40 mg by mouth daily.      Marland Kitchen gabapentin (NEURONTIN) 100 MG capsule Take 100 mg by mouth 3 (three) times daily.      Marland Kitchen ibuprofen (ADVIL,MOTRIN) 800 MG tablet Take 800 mg by mouth as needed.      Marland Kitchen ipratropium (ATROVENT) 0.02 % nebulizer solution Take 0.5 mg by nebulization every 4 (four) hours as needed for wheezing or shortness of breath (SHORTNESS OF BREATH).      Marland Kitchen lidocaine (LIDODERM) 5 % Place 1 patch onto the skin daily. Remove & Discard patch within 12 hours or as directed by MD      . loratadine (CLARITIN) 10 MG tablet Take 10 mg by mouth daily.      Marland Kitchen LORazepam (ATIVAN) 0.5 MG tablet Take 0.5 mg by mouth 2 (two) times daily as needed  for anxiety.      . metFORMIN (GLUCOPHAGE) 500 MG tablet Take 500 mg by mouth 2 (two) times daily with a meal.      . methocarbamol (ROBAXIN) 500 MG tablet Take 500 mg by mouth every 6 (six) hours as needed for muscle spasms (MUSCLE SPASMS).      . naproxen (NAPROSYN) 500 MG tablet Take 500 mg by mouth 2 (two) times daily with a meal.      . olopatadine (PATANOL) 0.1 % ophthalmic solution 1 drop 2 (two) times daily.      Marland Kitchen oxybutynin (DITROPAN-XL) 10 MG 24 hr tablet Take 10 mg by mouth at bedtime.      Marland Kitchen oxyCODONE-acetaminophen (PERCOCET) 7.5-325 MG per tablet Take one tablet by mouth twice daily; Take one tablet by mouth every 6 hours as needed for pain  180 tablet  0  . potassium chloride SA (K-DUR,KLOR-CON) 20 MEQ tablet Take 20 mEq by mouth 2 (two) times daily.      . promethazine (PHENERGAN) 25 MG tablet Take 25 mg by mouth  daily as needed for nausea or vomiting.      . simvastatin (ZOCOR) 10 MG tablet Take 10 mg by mouth at bedtime.       No current facility-administered medications for this visit.    History   Social History  . Marital Status: Single    Spouse Name: N/A    Number of Children: N/A  . Years of Education: N/A   Occupational History  . Not on file.   Social History Main Topics  . Smoking status: Former Smoker -- 2.00 packs/day    Types: Cigarettes    Quit date: 10/22/2003  . Smokeless tobacco: Never Used  . Alcohol Use: No  . Drug Use: No  . Sexual Activity: Not on file   Other Topics Concern  . Not on file   Social History Narrative  . No narrative on file    No family history on file.  Past Medical History  Diagnosis Date  . Bronchitis   . Hypercholesterolemia   . Hypertensive heart disease   . GERD (gastroesophageal reflux disease)   . Osteoarthritis   . Gout   . Cerebral palsy   . Diabetes mellitus without complication   . PAD (peripheral artery disease)   . Glaucoma   . Neuropathy   . CKD (chronic kidney disease)     History reviewed. No pertinent past surgical history.  Patient Active Problem List   Diagnosis Date Noted  . Shortness of breath 04/04/2014  . Hypercholesterolemia   . Hypertensive heart disease   . GERD (gastroesophageal reflux disease)   . Osteoarthritis   . Gout   . Cerebral palsy   . Diabetes mellitus without complication   . PAD (peripheral artery disease)   . Glaucoma   . Neuropathy   . CKD (chronic kidney disease)     ROS   Patient denies fever, chills, headache, sweats, rash, change in vision, change in hearing, chest pain, nausea or vomiting, urinary symptoms. All other systems are reviewed and are negative other than the history of present illness.  PHYSICAL EXAM  The patient is here in a wheelchair. She is here with the worker from her nursing home. She is oriented to person time and place. She has an expressive speech  difficulty, but she is able to converse. She is markedly overweight. Head is atraumatic. Sclera and conjunctiva are normal. Her neck is quite thick and evaluation of her  jugular venous pressure is limited. Lung exam reveals rales heard. There is no respiratory distress. Abdomen is protuberant but soft. She does have 1-2+ peripheral edema. Is difficult to assess her Foley for musculoskeletal deformities. There are no obvious skin rashes on a limited exam. Neurologic is grossly intact, other than her speech difficulty. Her O2 sat is 92% at rest.  Filed Vitals:   04/06/14 1101  BP: 126/68  Pulse: 99  SpO2: 92%   I reviewed the EKG that was done in March, 2015. There is no diagnostic abnormality.  ASSESSMENT & PLAN

## 2014-04-07 ENCOUNTER — Telehealth: Payer: Self-pay | Admitting: Cardiology

## 2014-04-07 NOTE — Telephone Encounter (Signed)
**Note De-Identified [name] [name] Obfuscation** The pts nurse is advised that Dr Ron Parker recommends that the pts salt and fluid intake be limited. He did not specify the amount of fluid or salt that the pt can have daily but to limit the amount. She verbalized understanding and per her request I faxed a copy of the pts OV notes to ATTN: [place] at [phone].

## 2014-04-07 NOTE — Telephone Encounter (Signed)
Follow up       Please call [address] hall nurse # [phone] needs a call back on this pt's fouid resrictions please.   How mush should pt be given per shift.

## 2014-04-26 ENCOUNTER — Other Ambulatory Visit: Payer: Self-pay

## 2014-04-26 MED ORDER — OXYCODONE-ACETAMINOPHEN 7.5-325 MG PO TABS: ORAL_TABLET | ORAL | Status: DC

## 2014-04-26 NOTE — Telephone Encounter (Signed)
Rx faxed to [facility] @ [phone], phone number [phone]  

## 2014-04-29 ENCOUNTER — Other Ambulatory Visit: Payer: Self-pay | Admitting: *Deleted

## 2014-04-29 MED ORDER — OXYCODONE-ACETAMINOPHEN 10-325 MG PO TABS: ORAL_TABLET | ORAL | Status: DC

## 2014-04-29 NOTE — Telephone Encounter (Signed)
[facility] 

## 2014-05-15 ENCOUNTER — Non-Acute Institutional Stay (SKILLED_NURSING_FACILITY): Payer: PRIVATE HEALTH INSURANCE | Admitting: Internal Medicine

## 2014-05-15 DIAGNOSIS — E1149 Type 2 diabetes mellitus with other diabetic neurological complication: Secondary | ICD-10-CM

## 2014-05-15 DIAGNOSIS — J449 Chronic obstructive pulmonary disease, unspecified: Secondary | ICD-10-CM

## 2014-05-15 NOTE — Progress Notes (Signed)
Patient ID: [name] [name], female   DOB: Nov 15, 1945, 68 y.o.   MRN: [id number]                   PROGRESS NOTE  DATE:  05/15/2014   FACILITY:  [facility]    LEVEL OF CARE:   SNF   Routine Visit   CHIEF COMPLAINT:  Routine visit to follow medical issues/[employer] visit for June.    HISTORY OF PRESENT ILLNESS:   This is a patient whose primary disability is secondary to cerebral palsy.  Nevertheless, she is very functional in the facility, pushes herself around in her electric wheelchair.    The patient apparently started to complain of shortness of breath and fatigue on exertion last month for I note that she had a BNP of 16.1 in March not exactly sure why this was done. She went to see cardiology who thought she might have heart failure and put her on twice a day Lasix for 3 days. She apparently is going for an echocardiogram at some point. Patient tells me today he had it she coughs on most nights apparently this was quite severe last night to per this is not productive. She does not specifically say she is short of breath nor does she complain of exertional chest pain or inspiratory pleuritic chest pain. She does have a history of "recurrent bronchitis" and significant gastroesophageal reflux disease.  PAST MEDICAL HISTORY/PROBLEM LIST:  Cerebral palsy.    Osteoarthritis.    Hypertension.      Gastroesophageal reflux disease.     Recurrent bronchitis.    Colitis, exact nature uncertain.    Type 2 diabetes with polyneuropathy.    Chronic venous stasis.    Obesity.    CURRENT MEDICATIONS:  Medication list is reviewed.     Allopurinol 300 a day.    Lasix 40 q.d.    Lotensin 30 mg a day.     Nexium 40 q.d.    Claritin 10 q.d.    Patanol 0.1%, 1 drop to each eye daily.    Ditropan XL 10 q.d.    Naprosyn 500 b.i.d.    Percocet 7.5/325 p.r.n.     Flexeril 10 mg three times a day.    Gabapentin 100 three times a day.    Zocor 10 q.d.    Lidoderm 5% patch to  lower back.    K-dur 20 mEq twice a day.    Calcium carbonate 500 b.i.d.    Mucinex 600 b.i.d.    Advair Diskus 100/50 b.i.d.    PHYSICAL EXAMINATION:   VITAL SIGNS:   TEMPERATURE:  97.3.   PULSE:  94.    RESPIRATIONS:  18.   BLOOD PRESSURE: Blood pressure is 124/66   WEIGHT:  220 pounds.   O2 SATURATIONS:   94% on room air.   CHEST/RESPIRATORY:  Reduced air entry bilaterally.  No obvious pulmonary distress.  There are no rales however she does have a prolonged expiratory phase and expiratory wheezing CARDIOVASCULAR:  CARDIAC:   Heart sounds are normal.  There are no murmurs.  She appears to be euvolemic.  No elevation in JVP scant peripheral edema no evidence of a DVT EDEMA/VARICOSITIES:  She has some degree of lymphedema and venous stasis.   GASTROINTESTINAL:  ABDOMEN:   Abdomen is quite obese and distended there. 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  Last hemoglobin A1c  was 6.6 on 04/29/20105.    Obstructive lung disease on Advair Diskus.   Severe osteoarthritis.  Shoulders and knees.    History of gout.  This has not been unstable.    Cerebral palsy.  She is quite disabled, but still functional in her wheelchair in the facility.    I am not able to obtain a history of shortness of breath however that apparently happened more with therapy. I think this is more likely to be pulmonary either some form of airway obstruction or perhaps gastroesophageal reflux. She was positioned in bed today at 21 although she did slip downwards and that combined with her protuberant abdomen might give her restrictive lung dsituation as well. I am not sure whether she could go through pulmonary function tests but bedsides from a tree might be useful.

## 2014-05-27 ENCOUNTER — Ambulatory Visit (INDEPENDENT_AMBULATORY_CARE_PROVIDER_SITE_OTHER): Payer: Medicare Other | Admitting: Cardiology

## 2014-05-27 ENCOUNTER — Encounter: Payer: Self-pay | Admitting: Cardiology

## 2014-05-27 ENCOUNTER — Ambulatory Visit (HOSPITAL_COMMUNITY)
Admission: RE | Admit: 2014-05-27 | Discharge: 2014-05-27 | Disposition: A | Discharge status: Home / Self Care | Payer: PRIVATE HEALTH INSURANCE | Source: Ambulatory Visit | Attending: Internal Medicine | Admitting: Internal Medicine

## 2014-05-27 VITALS — BP 113/78 | HR 102 | Ht 66.0 in

## 2014-05-27 DIAGNOSIS — I5032 Chronic diastolic (congestive) heart failure: Secondary | ICD-10-CM

## 2014-05-27 DIAGNOSIS — E119 Type 2 diabetes mellitus without complications: Secondary | ICD-10-CM | POA: Diagnosis not present

## 2014-05-27 DIAGNOSIS — R0902 Hypoxemia: Secondary | ICD-10-CM

## 2014-05-27 DIAGNOSIS — R0609 Other forms of dyspnea: Secondary | ICD-10-CM | POA: Diagnosis not present

## 2014-05-27 DIAGNOSIS — Z87891 Personal history of nicotine dependence: Secondary | ICD-10-CM | POA: Insufficient documentation

## 2014-05-27 DIAGNOSIS — R059 Cough, unspecified: Secondary | ICD-10-CM

## 2014-05-27 DIAGNOSIS — I079 Rheumatic tricuspid valve disease, unspecified: Secondary | ICD-10-CM | POA: Diagnosis not present

## 2014-05-27 DIAGNOSIS — I509 Heart failure, unspecified: Secondary | ICD-10-CM | POA: Diagnosis not present

## 2014-05-27 DIAGNOSIS — I10 Essential (primary) hypertension: Secondary | ICD-10-CM | POA: Insufficient documentation

## 2014-05-27 DIAGNOSIS — R0989 Other specified symptoms and signs involving the circulatory and respiratory systems: Secondary | ICD-10-CM | POA: Insufficient documentation

## 2014-05-27 DIAGNOSIS — E785 Hyperlipidemia, unspecified: Secondary | ICD-10-CM | POA: Diagnosis not present

## 2014-05-27 DIAGNOSIS — I517 Cardiomegaly: Secondary | ICD-10-CM

## 2014-05-27 DIAGNOSIS — R0602 Shortness of breath: Secondary | ICD-10-CM

## 2014-05-27 DIAGNOSIS — R05 Cough: Secondary | ICD-10-CM

## 2014-05-27 DIAGNOSIS — R9430 Abnormal result of cardiovascular function study, unspecified: Secondary | ICD-10-CM

## 2014-05-27 HISTORY — DX: Chronic diastolic (congestive) heart failure: I50.32

## 2014-05-27 HISTORY — DX: Cough, unspecified: R05.9

## 2014-05-27 MED ORDER — LOSARTAN POTASSIUM 25 MG PO TABS: 25.0000 mg | ORAL_TABLET | Freq: Every day | ORAL | Status: DC

## 2014-05-27 MED ORDER — FUROSEMIDE 40 MG PO TABS: 60.0000 mg | ORAL_TABLET | Freq: Every day | ORAL | Status: DC

## 2014-05-27 NOTE — Assessment & Plan Note (Signed)
Blood pressures controlled today.

## 2014-05-27 NOTE — Assessment & Plan Note (Signed)
I will change it ACE inhibitor to an ARB.

## 2014-05-27 NOTE — Assessment & Plan Note (Signed)
I believe that her shortness of breath and hypoxia is multifactorial. She has good systolic left ventricular function. I believe there is a component of diastolic CHF. I know that it is difficult for her to move rapidly to the toilet. However I do believe she needs further diuresis. We will increase her furosemide from 40-60 mg daily.

## 2014-05-27 NOTE — Progress Notes (Signed)
  Echocardiogram 2D Echocardiogram has been performed.  [name] [name] [name] 05/27/2014, 1:35 PM

## 2014-05-27 NOTE — Patient Instructions (Addendum)
Your physician has recommended you make the following change in your medication: increase Lasix to 60 mg daily, stop taking Benazepril and start taking Losartan 25 mg daily  Your physician recommends that you return for lab work in: 10 days  Your physician wants you to follow-up in: 6 months. You will receive a reminder letter in the mail two months in advance. If you don't receive a letter, please call our office to schedule the follow-up appointment.  Please limit your salt intake and limit extra fluid that is not at meals.

## 2014-05-27 NOTE — Progress Notes (Signed)
Patient ID: [name] [name], female   DOB: Nov 21, 1945, 68 y.o.   MRN: [id number]    HPI  Patient returns today for the followup of her shortness of breath. I saw her April 06, 2014. At that time I incurs it she decrease her overall fluid intake. We made plans to see her back today with a two-dimensional echo. The study was done at another site but I have reviewed the images personally. She has good left ventricular systolic function. There are no major valvular abnormalities. There is some dilatation of her right heart. Biventricular function appears to be okay. There is only slight pulmonary hypertension.  The patient has a persistent cough. It is possible that this could be related to her benazepril. We will change her to an ARB.  Allergies  Allergen Reactions  . Miconazole     Current Outpatient Prescriptions  Medication Sig Dispense Refill  . acetaminophen (TYLENOL) 500 MG tablet Take 500 mg by mouth every 6 (six) hours as needed (PAIN).      Marland Kitchen albuterol (PROVENTIL) (2.5 MG/3ML) 0.083% nebulizer solution Take 2.5 mg by nebulization every 4 (four) hours as needed for wheezing or shortness of breath (WHEEZING).      Marland Kitchen allopurinol (ZYLOPRIM) 300 MG tablet Take 300 mg by mouth daily.      . benazepril (LOTENSIN) 10 MG tablet Take 10 mg by mouth daily. Take with 20 mg tablet to equal 30 mg daily      . benazepril (LOTENSIN) 20 MG tablet Take 20 mg by mouth daily. Take with 10 mg tablet to equal 30 mg daily      . benzonatate (TESSALON) 100 MG capsule Take 100 mg by mouth 3 (three) times daily as needed for cough.       . calcium-vitamin D (OSCAL WITH D) 500-200 MG-UNIT per tablet Take 1 tablet by mouth 2 (two) times daily.      . cyclobenzaprine (FLEXERIL) 10 MG tablet Take 10 mg by mouth 3 (three) times daily.       Marland Kitchen dextromethorphan (DELSYM) 30 MG/5ML liquid Take 60 mg by mouth every 12 (twelve) hours as needed for cough.       . ergocalciferol (VITAMIN D2) 50000 UNITS capsule Take 50,000 Units  by mouth every 30 (thirty) days.      Marland Kitchen escitalopram (LEXAPRO) 5 MG tablet Take 5 mg by mouth daily.      Marland Kitchen esomeprazole (NEXIUM) 40 MG capsule Take 40 mg by mouth daily at 12 noon.      . Fluticasone-Salmeterol (ADVAIR) 100-50 MCG/DOSE AEPB Inhale 1 puff into the lungs every 12 (twelve) hours.       . furosemide (LASIX) 40 MG tablet Take 40 mg by mouth daily.      Marland Kitchen gabapentin (NEURONTIN) 100 MG capsule Take 100 mg by mouth 3 (three) times daily.      Marland Kitchen guaiFENesin (MUCINEX) 600 MG 12 hr tablet Take 600 mg by mouth 2 (two) times daily.      Marland Kitchen ibuprofen (ADVIL,MOTRIN) 800 MG tablet Take 800 mg by mouth as needed.      Marland Kitchen ipratropium (ATROVENT) 0.02 % nebulizer solution Take 0.5 mg by nebulization every 4 (four) hours as needed for wheezing or shortness of breath (SHORTNESS OF BREATH).      Marland Kitchen lactulose (CHRONULAC) 10 GM/15ML solution Take 30 g by mouth daily as needed for mild constipation.      Marland Kitchen loratadine (CLARITIN) 10 MG tablet Take 10 mg by mouth daily.      Marland Kitchen  LORazepam (ATIVAN) 0.5 MG tablet Take 0.5 mg by mouth 2 (two) times daily as needed for anxiety.      . metFORMIN (GLUCOPHAGE) 500 MG tablet Take 500 mg by mouth 2 (two) times daily with a meal.      . methocarbamol (ROBAXIN) 500 MG tablet Take 500 mg by mouth every 6 (six) hours as needed for muscle spasms (MUSCLE SPASMS).      . naproxen (NAPROSYN) 500 MG tablet Take 500 mg by mouth 2 (two) times daily with a meal.      . olopatadine (PATANOL) 0.1 % ophthalmic solution 1 drop 2 (two) times daily.      Marland Kitchen oxybutynin (DITROPAN-XL) 10 MG 24 hr tablet Take 10 mg by mouth at bedtime.      Marland Kitchen oxyCODONE-acetaminophen (PERCOCET) 10-325 MG per tablet Take one tablet by mouth twice daily for pain  60 tablet  0  . Polyethyl Glycol-Propyl Glycol (SYSTANE OP) Apply 1 drop to eye 2 (two) times daily.      . potassium chloride SA (K-DUR,KLOR-CON) 20 MEQ tablet Take 20 mEq by mouth 2 (two) times daily.      . promethazine (PHENERGAN) 25 MG tablet Take  25 mg by mouth daily as needed for nausea or vomiting.      . simvastatin (ZOCOR) 10 MG tablet Take 10 mg by mouth at bedtime.       No current facility-administered medications for this visit.    History   Social History  . Marital Status: Single    Spouse Name: N/A    Number of Children: N/A  . Years of Education: N/A   Occupational History  . Not on file.   Social History Main Topics  . Smoking status: Former Smoker -- 2.00 packs/day    Types: Cigarettes    Quit date: 10/22/2003  . Smokeless tobacco: Never Used  . Alcohol Use: No  . Drug Use: No  . Sexual Activity: Not on file   Other Topics Concern  . Not on file   Social History Narrative  . No narrative on file    No family history on file.  Past Medical History  Diagnosis Date  . Bronchitis   . Hypercholesterolemia   . Hypertensive heart disease   . GERD (gastroesophageal reflux disease)   . Osteoarthritis   . Gout   . Cerebral palsy   . Diabetes mellitus without complication   . PAD (peripheral artery disease)   . Glaucoma   . Neuropathy   . CKD (chronic kidney disease)     History reviewed. No pertinent past surgical history.  Patient Active Problem List   Diagnosis Date Noted  . Hypoxia 04/06/2014  . Shortness of breath 04/04/2014  . Hypercholesterolemia   . Hypertensive heart disease   . GERD (gastroesophageal reflux disease)   . Osteoarthritis   . Gout   . Cerebral palsy   . Diabetes mellitus without complication   . PAD (peripheral artery disease)   . Glaucoma   . Neuropathy   . CKD (chronic kidney disease)     ROS   The patient is here with one of the workers from the center where she stays. She does answer questions appropriately. She is significantly overweight. She has a speech impediment. Head is atraumatic. Sclera and conjunctiva are normal. Her neck is very difficult to assess. Lung exam suggests some rales. Cardiac exam reveals distant heart sounds. The abdomen is  protuberant. She does have 1+ pitting edema.  PHYSICAL  EXAM  Filed Vitals:   05/27/14 1430  BP: 113/78  Pulse: 102  Height: 5\' 6"  (1.676 m)     ASSESSMENT & PLAN

## 2014-05-27 NOTE — Assessment & Plan Note (Signed)
It appears that one component of her hypoxia is diastolic CHF. Her echo today shows good systolic function. She continues to have some edema. I believe she has some rales today. I'm going to suggest a small increase in her diuretic dose. We will recommend continued limitation of salt and only moderation and fluid.

## 2014-06-01 ENCOUNTER — Other Ambulatory Visit: Payer: Self-pay | Admitting: *Deleted

## 2014-06-01 MED ORDER — OXYCODONE-ACETAMINOPHEN 10-325 MG PO TABS: ORAL_TABLET | ORAL | Status: DC

## 2014-06-01 NOTE — Telephone Encounter (Signed)
[facility] 

## 2014-06-02 ENCOUNTER — Other Ambulatory Visit: Payer: Self-pay | Admitting: *Deleted

## 2014-06-02 MED ORDER — OXYCODONE-ACETAMINOPHEN 10-325 MG PO TABS: ORAL_TABLET | ORAL | Status: DC

## 2014-06-06 ENCOUNTER — Other Ambulatory Visit (INDEPENDENT_AMBULATORY_CARE_PROVIDER_SITE_OTHER): Payer: Medicare Other

## 2014-06-06 DIAGNOSIS — I10 Essential (primary) hypertension: Secondary | ICD-10-CM

## 2014-06-06 DIAGNOSIS — I509 Heart failure, unspecified: Secondary | ICD-10-CM

## 2014-06-06 DIAGNOSIS — I5032 Chronic diastolic (congestive) heart failure: Secondary | ICD-10-CM

## 2014-06-06 LAB — BASIC METABOLIC PANEL
BUN: 31 mg/dL — ABNORMAL HIGH (ref 6–23)
CHLORIDE: 95 meq/L — AB (ref 96–112)
CO2: 32 mEq/L (ref 19–32)
CREATININE: 1.3 mg/dL — AB (ref 0.4–1.2)
Calcium: 10.6 mg/dL — ABNORMAL HIGH (ref 8.4–10.5)
GFR: 45.6 mL/min — AB (ref >60.00)
Glucose, Bld: 129 mg/dL — ABNORMAL HIGH (ref 70–99)
Potassium: 4.3 mEq/L (ref 3.5–5.1)
Sodium: 139 mEq/L (ref 135–145)

## 2014-06-08 ENCOUNTER — Other Ambulatory Visit: Payer: Self-pay

## 2014-06-08 DIAGNOSIS — I5032 Chronic diastolic (congestive) heart failure: Secondary | ICD-10-CM

## 2014-07-04 ENCOUNTER — Non-Acute Institutional Stay (SKILLED_NURSING_FACILITY): Payer: PRIVATE HEALTH INSURANCE | Admitting: Internal Medicine

## 2014-07-04 DIAGNOSIS — E1129 Type 2 diabetes mellitus with other diabetic kidney complication: Secondary | ICD-10-CM

## 2014-07-04 DIAGNOSIS — J449 Chronic obstructive pulmonary disease, unspecified: Secondary | ICD-10-CM

## 2014-07-04 DIAGNOSIS — E1165 Type 2 diabetes mellitus with hyperglycemia: Secondary | ICD-10-CM

## 2014-07-04 NOTE — Progress Notes (Signed)
Patient ID: [name] [name], female   DOB: [birth year], 68 y.o.   MRN: [id number]                PROGRESS NOTE  DATE:  07/04/2014  FACILITY:  [facility]    LEVEL OF CARE:   SNF   Routine Visit   CHIEF COMPLAINT:  Routine visit to follow medical issues/[facility] visit for August    HISTORY OF PRESENT ILLNESS:   This is a patient whose primary disability is secondary to cerebral palsy.  Nevertheless, she is very functional in the facility, pushes herself around in her electric wheelchair.    The patient apparently started to complain of shortness of breath and fatigue on exertion last month for I note that she had a BNP of 16.1 in March not exactly sure why this was done. She went to see cardiology who thought she might have heart failure and put her on twice a day Lasix for 3 days. She had an echocardiogram done (see Below). Patient tells me today he had it she coughs on most nights apparently this was quite severe last night to per this is not productive. She does not specifically say she is short of breath nor does she complain of exertional chest pain or inspiratory pleuritic chest pain. She does have a history of "recurrent bronchitis" and significant gastroesophageal reflux disease.                          [place], [place] [address]                            [phone]  ------------------------------------------------------------------- Transthoracic Echocardiography  Patient:    [name] [name], [name] [name] MR #:       [id number] Study Date: 05/27/2014 Gender:     F Age:        85 Height: Weight: BSA: Pt. Status: Room:   [name], MD  REFERRING    Dola Argyle, MD  ATTENDING    Ricard Dillon  REFERRING    Ricard Dillon  SONOGRAPHER  Christy Little, RCS  PERFORMING   [facility], Outpatient  cc:  ------------------------------------------------------------------- LV EF: 60%  ------------------------------------------------------------------- Indications:      CHF -  428.0.  ------------------------------------------------------------------- History:   PMH:   Dyspnea.  Risk factors:  Former tobacco use. Hypertension. Diabetes mellitus. Dyslipidemia.  ------------------------------------------------------------------- Study Conclusions  - Left ventricle: Technically difficult study. The cavity size was   normal. Wall thickness was increased in a pattern of mild LVH.   The estimated ejection fraction was 60%. Regional wall motion   abnormalities cannot be excluded. - Left atrium: The atrium was mildly dilated. - Right ventricle: The cavity size was mildly dilated. Systolic   function was normal.  Transthoracic echocardiography.  M-mode, complete 2D, spectral Doppler, and color Doppler.  Birthdate:  Patient birthdate: 01-28-1946.  Age:  Patient is 68 yr old.  Sex:  Gender: female. Blood pressure:     126/68  Patient status:  Outpatient.  Study date:  Study date: 05/27/2014. Study time: 01:02 PM.  Location: Echo laboratory.  -------------------------------------------------------------------  ------------------------------------------------------------------- Left ventricle:  Technically difficult study. The cavity size was normal. Wall thickness was increased in a pattern of mild LVH. The estimated ejection fraction was 60%. Regional wall motion abnormalities cannot be excluded.  ------------------------------------------------------------------- Aortic valve:   Structurally normal valve.   Cusp separation was normal.  Doppler:  Transvalvular velocity was within the normal range. There was no stenosis. There was no regurgitation.  ------------------------------------------------------------------- Aorta:  Aortic root: The aortic root was normal in size.  ------------------------------------------------------------------- Mitral valve:   Structurally normal valve.   Leaflet separation was normal.  Doppler:  Transvalvular velocity was within  the normal range. There was no evidence for stenosis. There was no regurgitation.  ------------------------------------------------------------------- Left atrium:  The atrium was mildly dilated.  ------------------------------------------------------------------- Right ventricle:  The cavity size was mildly dilated. Systolic function was normal.  ------------------------------------------------------------------- Pulmonic valve:    The valve appears to be grossly normal. Doppler:  There was no significant regurgitation.  ------------------------------------------------------------------- Tricuspid valve:   Structurally normal valve.   Leaflet separation was normal.  Doppler:  Transvalvular velocity was within the normal range. There was trivial regurgitation.  ------------------------------------------------------------------- Pericardium:  There was no pericardial effusion.  -------------------------------------------------------------------    PAST MEDICAL HISTORY/PROBLEM LIST:  Cerebral palsy.    Osteoarthritis.    Hypertension.      Gastroesophageal reflux disease.     Recurrent bronchitis.    Colitis, exact nature uncertain.    Type 2 diabetes with polyneuropathy.    Chronic venous stasis.    Obesity.    CURRENT MEDICATIONS:  Medication list is reviewed.     Allopurinol 300 a day.    Lasix 40 q.d.    Losartan .     Nexium 40 q.d.    Claritin 10 q.d.    Patanol 0.1%, 1 drop to each eye daily.    Ditropan XL 10 q.d.    Naprosyn 500 b.i.d.    Percocet 7.5/325 p.r.n.     Flexeril 10 mg three times a day.    Gabapentin 100 three times a day.    Zocor 10 q.d.    Lidoderm 5% patch to lower back.    K-dur 20 mEq twice a day.    Calcium carbonate 500 b.i.d.    Mucinex 600 b.i.d.    Advair Diskus 100/50 b.i.d.    PHYSICAL EXAMINATION:   VITAL SIGNS:   TEMPERATURE:   97.6 PULSE:  66.    RESPIRATIONS:  18.   BLOOD PRESSURE: Blood  pressure is 120/74    WEIGHT:  2:15 pounds.   O2 SATURATIONS:   97% on room air.   CHEST/RESPIRATORY:  Reduced air entry bilaterally.  No obvious pulmonary distress.  There are no rales however she does have a prolonged expiratory phase and expiratory wheezing CARDIOVASCULAR:  CARDIAC:   Heart sounds are normal.  There are no murmurs.  She appears to be euvolemic.  No elevation in JVP scant peripheral edema no evidence of a DVT EDEMA/VARICOSITIES:  She has some degree of lymphedema and venous stasis.   GASTROINTESTINAL:  ABDOMEN:   Abdomen is quite obese and distended there. 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  Last hemoglobin A1c was 6.6 on 04/29/20105.    Obstructive lung disease on Advair Diskus.  Consider PFTs question asthma challenge. Would wonder if some of this at night is severe reflux   Severe osteoarthritis.  Shoulders and knees.    History of gout.  This has not been unstable.    Cerebral palsy.  She is quite disabled, but still functional in her wheelchair in the facility.

## 2014-07-12 ENCOUNTER — Other Ambulatory Visit (HOSPITAL_COMMUNITY): Payer: Self-pay | Admitting: Diagnostic Radiology

## 2014-07-12 DIAGNOSIS — Z1231 Encounter for screening mammogram for malignant neoplasm of breast: Secondary | ICD-10-CM

## 2014-07-25 ENCOUNTER — Other Ambulatory Visit: Payer: Self-pay | Admitting: *Deleted

## 2014-07-25 MED ORDER — OXYCODONE-ACETAMINOPHEN 10-325 MG PO TABS: ORAL_TABLET | ORAL | Status: DC

## 2014-07-25 NOTE — Telephone Encounter (Signed)
[facility] 

## 2014-07-29 ENCOUNTER — Ambulatory Visit (INDEPENDENT_AMBULATORY_CARE_PROVIDER_SITE_OTHER): Payer: Medicare Other | Admitting: Internal Medicine

## 2014-07-29 ENCOUNTER — Encounter: Payer: Self-pay | Admitting: Internal Medicine

## 2014-07-29 VITALS — BP 126/80 | HR 97 | Temp 99.0°F

## 2014-07-29 DIAGNOSIS — R05 Cough: Secondary | ICD-10-CM

## 2014-07-29 DIAGNOSIS — R059 Cough, unspecified: Secondary | ICD-10-CM

## 2014-07-29 NOTE — Patient Instructions (Signed)
Stop lotensin and if continues stop losartan also  pCXR  Stop advair Change duoneb to qid, not prn Prednisone 10 mg take  4 each am x 2 days,   2 each am x 2 days,  1 each am x 2 days and stop  Add pepcid 20 mg at bedtime GERD (REFLUX)  is an extremely common cause of respiratory symptoms, many times with no significant heartburn at all.    It can be treated with medication, but also with lifestyle changes including avoidance of late meals, excessive alcohol, smoking cessation, and avoid fatty foods, chocolate, peppermint, colas, red wine, and acidic juices such as orange juice.  NO MINT OR MENTHOL PRODUCTS SO NO COUGH DROPS  USE SUGARLESS CANDY INSTEAD (jolley ranchers or Stover's)  NO OIL BASED VITAMINS - use powdered substitutes.    If not better next step would be swallowing eval and sinus CT

## 2014-07-29 NOTE — Progress Notes (Signed)
   Subjective:    Patient ID: [name] [name], female    DOB: 06/29/1946    MRN: [id number]  HPI  58 yowf [facility] resident with CP/ w/c bound quit smoking 2005 with  No apparent sequelae referred by [name] for cough since ? Years?   07/29/2014 1st [facility] office visit/ [name]   Chief Complaint  Patient presents with  . Pulmonary Consult    Referred by [name]. Pt c/o cough x 1 month- prod with green sputum. She states that her cough is esp worse at night.   also worse with swallowing, disturbs sleep  ? On acei and arb ? Plus advair > no better.  Not limited by breathing from desired activities  But w/c bound and needs assist bed to chair  No obvious other patterns in day to day or daytime variabilty or assoc chronic cough or cp or chest tightness, subjective wheeze overt sinus or hb symptoms. No unusual exp hx or h/o childhood pna/ asthma or knowledge of premature birth.  Sleeping ok without nocturnal  or early am exacerbation  of respiratory  c/o's or need for noct saba. Also denies any obvious fluctuation of symptoms with weather or environmental changes or other aggravating or alleviating factors except as outlined above   Current Medications, Allergies, Complete Past Medical History, Past Surgical History, Family History, and Social History were reviewed in [employer] record.           Review of Systems  Constitutional: Negative for fever, chills and unexpected weight change.  HENT: Positive for sinus pressure. Negative for congestion, dental problem, ear pain, nosebleeds, postnasal drip, rhinorrhea, sneezing, sore throat, trouble swallowing and voice change.   Eyes: Negative for visual disturbance.  Respiratory: Positive for cough. Negative for choking and shortness of breath.   Cardiovascular: Negative for chest pain and leg swelling.  Gastrointestinal: Negative for vomiting, abdominal pain and diarrhea.  Genitourinary: Negative for difficulty  urinating.  Musculoskeletal: Negative for arthralgias.  Skin: Negative for rash.  Neurological: Positive for headaches. Negative for tremors and syncope.  Hematological: Does not bruise/bleed easily.       Objective:   Physical Exam  W/c bound obese hoarse wf nad  HEENT: edentulous dentition, turbinates, and orophanx. Nl external ear canals without cough reflex   NECK :  without JVD/Nodes/TM/ nl carotid upstrokes bilaterally   LUNGS: no acc muscle use, clear to A and P bilaterally without cough on insp or exp maneuvers   CV:  RRR  no s3 or murmur or increase in P2, no edema   ABD:  soft and nontender with nl excursion in the supine position. No bruits or organomegaly, bowel sounds nl  MS:  warm without deformities, calf tenderness, cyanosis or clubbing  SKIN: warm and dry without lesions    NEURO:  alert, approp, no deficits      No cxr's on file in EPIC        Assessment & Plan:

## 2014-07-31 NOTE — Assessment & Plan Note (Signed)
The most common causes of chronic cough in immunocompetent adults include the following: upper airway cough syndrome (UACS), previously referred to as postnasal drip syndrome (PNDS), which is caused by variety of rhinosinus conditions; (2) asthma; (3) GERD; (4) chronic bronchitis from cigarette smoking or other inhaled environmental irritants; (5) nonasthmatic eosinophilic bronchitis; and (6) bronchiectasis.   These conditions, singly or in combination, have accounted for up to 94% of the causes of chronic cough in prospective studies.   Other conditions have constituted no >6% of the causes in prospective studies These have included bronchogenic carcinoma, chronic interstitial pneumonia, sarcoidosis, left ventricular failure, ACEI-induced cough, and aspiration from a condition associated with pharyngeal dysfunction.    Chronic cough is often simultaneously caused by more than one condition. A single cause has been found from 38 to 82% of the time, multiple causes from 18 to 62%. Multiply caused cough has been the result of three diseases up to 42% of the time.       Based on hx and exam, this is most likely:  Classic Upper airway cough syndrome, so named because it's frequently impossible to sort out how much is  CR/sinusitis with freq throat clearing (which can be related to primary GERD)   vs  causing  secondary (" extra esophageal")  GERD from wide swings in gastric pressure that occur with throat clearing, often  promoting self use of mint and menthol lozenges that reduce the lower esophageal sphincter tone and exacerbate the problem further in a cyclical fashion.   These are the same pts (now being labeled as having "irritable larynx syndrome" by some cough centers) who not infrequently have a history of having failed to tolerate ace inhibitors,  dry powder inhalers or biphosphonates or report having atypical reflux symptoms that don't respond to standard doses of PPI , and are easily confused as  having aecopd or asthma flares by even experienced allergists/ pulmonologists.   The first step is to maximize acid suppression and eliminate advair and acei  Need to be aware if still on losartan: For reasons that may related to vascular permability and nitric oxide pathways but not elevated  bradykinin levels (as seen with  ACEi use) losartan in the generic form has been reported now from mulitple sources  to cause a similar pattern of non-specific  upper airway symptoms as seen with acei.   This has not been reported with exposure to the other ARB's to date, so it seems reasonable for now to try either generic diovan or avapro if ARB needed or use an alternative class altogether.  See:  Lelon Frohlich Allergy Asthma Immunol  2008: 101: p [phone]     Next step is swallowing eval if not done and sinus ct if not improving   See instructions for specific recommendations which were reviewed directly with the patient and sent back to SNF with  a copy with highlighter outlining the key components.

## 2014-08-09 ENCOUNTER — Ambulatory Visit (HOSPITAL_COMMUNITY)
Admission: RE | Admit: 2014-08-09 | Discharge: 2014-08-09 | Disposition: A | Discharge status: Home / Self Care | Payer: Medicare Other | Source: Ambulatory Visit | Attending: Diagnostic Radiology | Admitting: Diagnostic Radiology

## 2014-08-09 DIAGNOSIS — Z1231 Encounter for screening mammogram for malignant neoplasm of breast: Secondary | ICD-10-CM | POA: Diagnosis not present

## 2014-08-12 ENCOUNTER — Other Ambulatory Visit (HOSPITAL_COMMUNITY): Payer: Self-pay | Admitting: Internal Medicine

## 2014-08-12 DIAGNOSIS — R1310 Dysphagia, unspecified: Secondary | ICD-10-CM

## 2014-08-19 ENCOUNTER — Ambulatory Visit (HOSPITAL_COMMUNITY)
Admission: RE | Admit: 2014-08-19 | Discharge: 2014-08-19 | Disposition: A | Discharge status: Home / Self Care | Payer: Medicare Other | Source: Ambulatory Visit | Attending: Internal Medicine | Admitting: Internal Medicine

## 2014-08-19 DIAGNOSIS — R1310 Dysphagia, unspecified: Secondary | ICD-10-CM | POA: Insufficient documentation

## 2014-08-19 DIAGNOSIS — R1313 Dysphagia, pharyngeal phase: Secondary | ICD-10-CM | POA: Diagnosis present

## 2014-08-19 NOTE — Procedures (Signed)
Objective Swallowing Evaluation: Modified Barium Swallowing Study  Patient Details  Name: [name] [name] MRN: [id number] Date of Birth: Dec 03, 1945  Today's Date: 08/19/2014 Time: 0814-4818 SLP Time Calculation (min): 23 min  Past Medical History:  Past Medical History  Diagnosis Date  . Bronchitis   . Hypercholesterolemia   . Hypertensive heart disease   . GERD (gastroesophageal reflux disease)   . Osteoarthritis   . Gout   . Cerebral palsy   . Diabetes mellitus without complication   . PAD (peripheral artery disease)   . Glaucoma   . Neuropathy   . CKD (chronic kidney disease)   . Ejection fraction    Past Surgical History: No past surgical history on file. HPI:  Pleasant 68 year old seen at [facility] for outpatient MBS.  PMHL  cerebral palsy, GERD,HTN,recurrent bronchitis, COPD, morbid obesity, osteoarthritis.  Prior MBS 09/07/2012 found mild oral and pharyngeal dysphagia (flash penetration only with large consecutive sips), appearance of esophageal statsis and recommended regular diet, thin liquids.  Pt. had appointment with pulmonologist who suspected classic upper airway cough syndrome.     Assessment / Plan / Recommendation Clinical Impression  Dysphagia Diagnosis: Suspected primary esophageal dysphagia;Mild pharyngeal phase dysphagia Clinical impression: Pt. exhibited minimal and intermittent pharyngeal dysphagia with minimal sensory component.  Several instances of delayed swallow initiation with thin liquids. Laryngeal elevation, pharyngeal contraction, tongue base rectraction were WFL's without penetration/aspiration or residue.  Esophagus was briefly scanned (no radiologist present) during pill/thin barium consumption with observance of pill stopping mid esophagus requiring additional sip thin to transit to lower esophageal sphincter.  Cough during or following meals likely due to esophageal stasis. Recommend continue regular texture with chopped meats, thin liquids,  pills whole in applesauce, sit upright and stay upright minimum 45 min after meals, alternate liquids/solids.     Treatment Recommendation  Defer treatment plan to SLP at (Comment)    Diet Recommendation Regular;Thin liquid   Liquid Administration via: Cup;Straw Medication Administration: Whole meds with puree Supervision: Patient able to self feed Compensations: Slow rate;Small sips/bites;Follow solids with liquid Postural Changes and/or Swallow Maneuvers: Seated upright 90 degrees;Upright 30-60 min after meal;Out of bed for meals    Other  Recommendations Oral Care Recommendations: Oral care BID   Follow Up Recommendations  Skilled Nursing facility    Frequency and Duration        Pertinent Vitals/Pain No pain           Reason for Referral Objectively evaluate swallowing function   Oral Phase Oral Preparation/Oral Phase Oral Phase: WFL   Pharyngeal Phase Pharyngeal Phase Pharyngeal Phase: Impaired Pharyngeal - Nectar Pharyngeal - Nectar Cup: Within functional limits Pharyngeal - Thin Pharyngeal - Thin Cup: Delayed swallow initiation;Premature spillage to valleculae Pharyngeal - Thin Straw: Delayed swallow initiation;Premature spillage to pyriform sinuses Pharyngeal - Solids Pharyngeal - Regular: Within functional limits Pharyngeal - Pill: Within functional limits  Cervical Esophageal Phase    GO    Cervical Esophageal Phase Cervical Esophageal Phase: [facility]    Functional Assessment Tool Used: skilled clinical judgement Functional Limitations: Swallowing Swallow Current Status ([id number]): At least 1 percent but less than 20 percent impaired, limited or restricted Swallow Goal Status [phone]): At least 1 percent but less than 20 percent impaired, limited or restricted Swallow Discharge Status [phone]): At least 1 percent but less than 20 percent impaired, limited or restricted    Houston Siren 08/19/2014, 3:19 PM  Orbie Pyo Colvin Caroli.Ed [employer]  [phone]

## 2014-08-20 ENCOUNTER — Non-Acute Institutional Stay (SKILLED_NURSING_FACILITY): Payer: PRIVATE HEALTH INSURANCE | Admitting: Internal Medicine

## 2014-08-20 DIAGNOSIS — R05 Cough: Secondary | ICD-10-CM

## 2014-08-20 DIAGNOSIS — K219 Gastro-esophageal reflux disease without esophagitis: Secondary | ICD-10-CM

## 2014-08-20 DIAGNOSIS — G809 Cerebral palsy, unspecified: Secondary | ICD-10-CM

## 2014-08-20 DIAGNOSIS — R059 Cough, unspecified: Secondary | ICD-10-CM

## 2014-08-20 NOTE — Progress Notes (Signed)
Patient ID: [name] [name], female   DOB: May 29, 1946, 68 y.o.   MRN: [id number]                PROGRESS NOTE  DATE:  08/02/2014  FACILITY:  [facility]    LEVEL OF CARE:   SNF   Routine Visit   CHIEF COMPLAINT:  Routine visit to follow medical issues/[facility] visit   HISTORY OF PRESENT ILLNESS:   This is a patient whose primary disability is secondary to cerebral palsy.  Nevertheless, she is very functional in the facility, pushes herself around in her electric wheelchair.    The patient apparently started to complain of shortness of breath and fatigue on exertion last month for I note that she had a BNP of 16.1 in March not exactly sure why this was done. She went to see cardiology who thought she might have heart failure and put her on twice a day Lasix for 3 days. She had an echocardiogram done (see Below). Patient tells me today he had it she coughs on most nights apparently this was quite severe last night to per this is not productive. She does not specifically say she is short of breath nor does she complain of exertional chest pain or inspiratory pleuritic chest pain. She does have a history of "recurrent bronchitis" and significant gastroesophageal reflux disease. She feels the cough has improved but has not resolved. Advair discus has not helped                          [place], [place] [id number]                            [phone]  ------------------------------------------------------------------- Transthoracic Echocardiography  Patient:    [name] [name] MR #:       [id number] Study Date: 05/27/2014 Gender:     F Age:        28 Height: Weight: BSA: Pt. Status: Room:   [name] Squibb, MD  REFERRING    Dola Argyle, MD  ATTENDING    Ricard Dillon  REFERRING    Ricard Dillon  SONOGRAPHER  Christy Little, RCS  PERFORMING   [facility], Outpatient  cc:  ------------------------------------------------------------------- LV EF:  60%  ------------------------------------------------------------------- Indications:      CHF - 428.0.  ------------------------------------------------------------------- History:   PMH:   Dyspnea.  Risk factors:  Former tobacco use. Hypertension. Diabetes mellitus. Dyslipidemia.  ------------------------------------------------------------------- Study Conclusions  - Left ventricle: Technically difficult study. The cavity size was   normal. Wall thickness was increased in a pattern of mild LVH.   The estimated ejection fraction was 60%. Regional wall motion   abnormalities cannot be excluded. - Left atrium: The atrium was mildly dilated. - Right ventricle: The cavity size was mildly dilated. Systolic   function was normal.  Transthoracic echocardiography.  M-mode, complete 2D, spectral Doppler, and color Doppler.  Birthdate:  Patient birthdate: Mar 24, 1946.  Age:  Patient is 68 yr old.  Sex:  Gender: female. Blood pressure:     126/68  Patient status:  Outpatient.  Study date:  Study date: 05/27/2014. Study time: 01:02 PM.  Location: Echo laboratory.  -------------------------------------------------------------------  ------------------------------------------------------------------- Left ventricle:  Technically difficult study. The cavity size was normal. Wall thickness was increased in a pattern of mild LVH. The estimated ejection fraction was 60%. Regional wall motion abnormalities cannot be excluded.  ------------------------------------------------------------------- Aortic valve:  Structurally normal valve.   Cusp separation was normal.  Doppler:  Transvalvular velocity was within the normal range. There was no stenosis. There was no regurgitation.  ------------------------------------------------------------------- Aorta:  Aortic root: The aortic root was normal in size.  ------------------------------------------------------------------- Mitral valve:    Structurally normal valve.   Leaflet separation was normal.  Doppler:  Transvalvular velocity was within the normal range. There was no evidence for stenosis. There was no regurgitation.  ------------------------------------------------------------------- Left atrium:  The atrium was mildly dilated.  ------------------------------------------------------------------- Right ventricle:  The cavity size was mildly dilated. Systolic function was normal.  ------------------------------------------------------------------- Pulmonic valve:    The valve appears to be grossly normal. Doppler:  There was no significant regurgitation.  ------------------------------------------------------------------- Tricuspid valve:   Structurally normal valve.   Leaflet separation was normal.  Doppler:  Transvalvular velocity was within the normal range. There was trivial regurgitation.  ------------------------------------------------------------------- Pericardium:  There was no pericardial effusion.  -------------------------------------------------------------------    PAST MEDICAL HISTORY/PROBLEM LIST:  Cerebral palsy.    Osteoarthritis.    Hypertension.      Gastroesophageal reflux disease.     Recurrent bronchitis.    Colitis, exact nature uncertain.    Type 2 diabetes with polyneuropathy.    Chronic venous stasis.    Obesity.    CURRENT MEDICATIONS:  Medication list is reviewed.     Allopurinol 300 a day.    Lasix 40 q.d.    Losartan .     Nexium 40 q.d.    Claritin 10 q.d.    Patanol 0.1%, 1 drop to each eye daily.    Ditropan XL 10 q.d.    Naprosyn 500 b.i.d.    Percocet 7.5/325 p.r.n.     Flexeril 10 mg three times a day.    Gabapentin 100 three times a day.    Zocor 10 q.d.    Lidoderm 5% patch to lower back.    K-dur 20 mEq twice a day.    Calcium carbonate 500 b.i.d.    Mucinex 600 b.i.d.    Advair Diskus 100/50 b.i.d.    PHYSICAL EXAMINATION:    VITAL SIGNS:   TEMPERATURE:   98.4 PULSE:  66.    RESPIRATIONS:  18.   BLOOD PRESSURE: Blood pressure is136/80   WEIGHT:  2:15 pounds.   O2 SATURATIONS:   97% on room air.   CHEST/RESPIRATORY:  Reduced air entry bilaterally.  No obvious pulmonary distress.  There are no rales however she does have a prolonged expiratory phase and expiratory wheezing CARDIOVASCULAR:  CARDIAC:   Heart sounds are normal.  There are no murmurs.  She appears to be euvolemic.  No elevation in JVP scant peripheral edema no evidence of a DVT EDEMA/VARICOSITIES:  She has some degree of lymphedema and venous stasis.   GASTROINTESTINAL:  ABDOMEN:   Abdomen is quite obese and distended there. 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  Last hemoglobin A1c was 6.6 on 04/29/20105.    Obstructive lung disease on Advair Diskus.  Consider PFTs question asthma challenge. Would wonder if some of this at night is severe reflux or psot nasal drip.  Severe osteoarthritis.  Shoulders and knees.    History of gout.  This has not been unstable.    Cerebral palsy.  She is quite disabled, but still functional in her wheelchair in the facility.

## 2014-08-23 ENCOUNTER — Non-Acute Institutional Stay (SKILLED_NURSING_FACILITY): Payer: PRIVATE HEALTH INSURANCE | Admitting: Internal Medicine

## 2014-08-23 DIAGNOSIS — R059 Cough, unspecified: Secondary | ICD-10-CM

## 2014-08-23 DIAGNOSIS — R05 Cough: Secondary | ICD-10-CM

## 2014-08-23 DIAGNOSIS — K219 Gastro-esophageal reflux disease without esophagitis: Secondary | ICD-10-CM

## 2014-08-26 NOTE — Progress Notes (Signed)
Patient ID: [name] [name], female   DOB: [birth year], 68 y.o.   MRN: [id number]               PROGRESS NOTE  DATE:  08/23/2014    FACILITY: [facility]    LEVEL OF CARE:   SNF   Acute Visit   CHIEF COMPLAINT:  Follow up chronic cough.    HISTORY OF PRESENT ILLNESS:  I continue to follow this lady, who is somebody I saw three weeks ago.  She has been complaining of mostly a hacking, dry cough which is worse at night when she goes to bed.  She has some exertional shortness of breath.  She went to see Cardiology who thought she might have heart failure, although I doubt this is true.  She had a reasonably normal echocardiogram.  She subsequently went on to see Pulmonology.  It was felt that she had possible upper airway cough syndrome (postnasal drip syndrome).  Gastroesophageal reflux was felt to be possible, as well.  It was suggested that we stop her Losartan.  It was also suggested that we eliminate Advair.  She has had a swallowing evaluation which did not show an aspiration.  No changes in her diet, oral consistency nor her liquid consistency, were recommended.  We have her on DuoNebs q.i.d., Pepcid, and a steroid taper.  There was some initial improvement.  However, this got worse again two weeks ago.  I note that she has been put on prednisone at 25 mg after yet another taper.  We will continue this for now, but I would like to eventually, if we can, get this patient off prednisone as I do not believe there is a safe dose in this population.    PHYSICAL EXAMINATION:   CARDIOVASCULAR:  CARDIAC:  Heart sounds are distant.  There are no murmurs.  No signs of congestive heart failure.   CHEST/RESPIRATORY:  Shallow, but otherwise clear.  There still is some wheezing.    ASSESSMENT/PLAN:  To date, none of the orders that we have received are initiated ourselves.  That resulted in much change, although the patient did not look in any distress today when I saw her during the day.    She claims  her cough is much worse at night.  One would have to consider nocturnal asthma in this woman, as well as the reflux or postnasal drip.  She is not aspirating, per the studies we have done which I have reviewed today    CPT CODE: 74163

## 2014-09-06 ENCOUNTER — Non-Acute Institutional Stay (SKILLED_NURSING_FACILITY): Payer: PRIVATE HEALTH INSURANCE | Admitting: Internal Medicine

## 2014-09-06 DIAGNOSIS — R05 Cough: Secondary | ICD-10-CM

## 2014-09-06 DIAGNOSIS — R059 Cough, unspecified: Secondary | ICD-10-CM

## 2014-09-06 DIAGNOSIS — J4521 Mild intermittent asthma with (acute) exacerbation: Secondary | ICD-10-CM

## 2014-09-08 NOTE — Progress Notes (Signed)
Patient ID: [name] [name], female   DOB: Mar 09, 1946, 68 y.o.   MRN: [id number]               PROGRESS NOTE  DATE:  09/06/2014       FACILITY: [facility]    LEVEL OF CARE:   SNF   Acute Visit   CHIEF COMPLAINT:  Follow up chronic cough.    HISTORY OF PRESENT ILLNESS:  I have been following this lady along with the [facility] staff for a dry, hacking cough, worse at night.  She also had some exertional shortness of breath.  She has had comprehensive cardiac and pulmonary reviews.  It was felt by Pulmonology that she had a possible upper airway cough syndrome (Dr. Melvyn Novas).  Gastroesophageal reflux was also felt to be possible.  It was suggested that both Losartan and Advair be eliminated, and this was done.    She continues on a prednisone taper and now is on chronic prednisone, DuoNebs, and Pepcid.   Things seem to be better.    I spoke with nursing today who concur that the cough is improved, although they note it mostly in the morning.  Not certain about relations to breakfast, however.    I think we have been managing this as mostly a nocturnal cough.  Whether she has nocturnal asthma, I am really uncertain.  I think PFTs might be reasonable and I think she could tolerate this with a possible asthma challenge.  There is no evidence that she is aspirating per her swallowing evaluation.    ASSESSMENT/PLAN:     ?Nocturnal asthma.  See discussion above.   There is quoting of previous PFTs, although I do not know where these are.  I cannot see them in [facility], nor do I see this quoted in Dr. Gustavus Bryant note.  Therefore, I do not think this was available to him.

## 2014-09-13 ENCOUNTER — Non-Acute Institutional Stay (SKILLED_NURSING_FACILITY): Payer: PRIVATE HEALTH INSURANCE | Admitting: Internal Medicine

## 2014-09-13 DIAGNOSIS — R05 Cough: Secondary | ICD-10-CM

## 2014-09-13 DIAGNOSIS — R059 Cough, unspecified: Secondary | ICD-10-CM

## 2014-09-13 DIAGNOSIS — K219 Gastro-esophageal reflux disease without esophagitis: Secondary | ICD-10-CM

## 2014-09-19 NOTE — Progress Notes (Addendum)
Patient ID: [name] [name], female   DOB: January 30, 1946, 68 y.o.   MRN: [id number]               PROGRESS NOTE  DATE:  09/13/2014    FACILITY: [facility]    LEVEL OF CARE:   SNF   Acute Visit   CHIEF COMPLAINT:  Cough.    HISTORY OF PRESENT ILLNESS:  I am once again reviewing [name] [name] for really a several-month problem, a chronic disabling cough.  She was initially felt to be in heart failure and sent to Cardiology, although I do not think this was really the source.  She was seen by [name] [name] last on 07/29/2014 and I have carefully reviewed his recommendations today.  He felt that this was likely upper airway cough syndrome.  I have reviewed his suggestions.  These have included eliminating Advair and generic Losartan, starting Pepcid and a steroid taper.  Her cough improved temporarily but, a few weeks ago since the beginning of November, it has worsened.  She states she can barely sleep at night and she coughs during the day.  This is loose, although she cannot produce anything.  She has passed a swallowing study.  No changes in her diet were suggested.   We have wondered whether this was asthma/nocturnal asthma and/or GERD.  She is on Nexium and Pepcid.    CURRENT MEDICATIONS:  Medication list is reviewed.     Allopurinol 300 daily.    Nexium 40 q.d.    Claritin 10 q.d.    Ditropan XL 10 q.d.    Cozaar 25 q.d.    Lasix 60 q.d.    Lexapro 10 q.d.    Naprosyn 500 b.i.d.    K-dur 20 twice daily.    Calcium carbonate 600 twice daily.    Mucinex 600 twice daily.    Percocet 10/325 b.i.d.    Flexeril 20 mg three times a day.    Gabapentin 100 three times a day.    Zocor 10 q.d.    Pepcid 20 b.i.d.    Prednisone 5 mg a day, started on 08/22/2014.    REVIEW OF SYSTEMS:   CARDIAC:   She is not complaining of chest pain.   GI:  No clear dyspepsia and/or has it into her mouth.    PHYSICAL EXAMINATION:   VITAL SIGNS:   O2 SATURATIONS:  95% on room air.   PULSE:  78.     RESPIRATIONS:  20.   GENERAL APPEARANCE:  The patient is not in any distress.   CHEST/RESPIRATORY:  There is increased air entry bilaterally in the lower lobes with coarse crackles.  The upper lungs are clearer.  Mild expiratory phase prolongation, but no clear wheezing.  There is no accessory muscle use.   CARDIOVASCULAR:  CARDIAC:   Heart sounds are normal.  JVP is not elevated.   EDEMA/VARICOSITIES:  There is no edema.  I do not believe there is evidence of congestive heart failure.    ASSESSMENT/PLAN:                        Cough.  Felt to be classic upper airway cough syndrome by [name] [name], although we have not exactly helped her with any of the current interventions.  Initially, the steroid taper seemed to help, suggesting this might be asthma.  However, after the taper is complete, the coughing starts again.  I am somewhat concerned today about the decline in her  exam, especially over the left lower lobe.  I am going to send her to [facility] for x-rays of her chest as well as her sinuses.  Elimination of her Losartan, dry powder Advair, and maximum acid suppression do not seem to have helped all that much.

## 2014-09-23 ENCOUNTER — Other Ambulatory Visit: Payer: Self-pay | Admitting: *Deleted

## 2014-09-23 MED ORDER — OXYCODONE-ACETAMINOPHEN 10-325 MG PO TABS: ORAL_TABLET | ORAL | Status: DC

## 2014-09-23 NOTE — Telephone Encounter (Signed)
[facility] 

## 2014-10-18 ENCOUNTER — Non-Acute Institutional Stay (SKILLED_NURSING_FACILITY): Payer: PRIVATE HEALTH INSURANCE | Admitting: Internal Medicine

## 2014-10-18 DIAGNOSIS — R059 Cough, unspecified: Secondary | ICD-10-CM

## 2014-10-18 DIAGNOSIS — K219 Gastro-esophageal reflux disease without esophagitis: Secondary | ICD-10-CM

## 2014-10-18 DIAGNOSIS — G809 Cerebral palsy, unspecified: Secondary | ICD-10-CM

## 2014-10-18 DIAGNOSIS — J4521 Mild intermittent asthma with (acute) exacerbation: Secondary | ICD-10-CM

## 2014-10-18 DIAGNOSIS — R05 Cough: Secondary | ICD-10-CM

## 2014-10-24 NOTE — Progress Notes (Addendum)
Patient ID: [name] [name], female   DOB: [birth year], 69 y.o.   MRN: [id number]               PROGRESS NOTE  DATE:  10/18/2014       FACILITY: [facility]    LEVEL OF CARE:   SNF   Routine Visit        CHIEF COMPLAINT:   Follow up medical issues/[facility] visit.    HISTORY OF PRESENT ILLNESS:  [name] [name] is a patient who has been in the facility since February 2012.  She has cerebral palsy, gastroesophageal reflux, depression.    Over the last eight weeks, she has complained fairly persistently about a hacking cough that is worse at night.  she has seen Dr. Melvyn Novas and was last reviewed in October.  He felt this was upper airway cough syndrome.  We have eliminated Advair and generic Losartan.  Started Pepcid for maximal acid suppression.  She has had steroid tapers and her cough seems to improve temporarily, but worsens after the steroids stop.   She has had a swallowing study.  We have wondered whether she might have nocturnal asthma and/or significant gastroesophageal reflux.    CURRENT MEDICATIONS:  Medication list is reviewed.     Allopurinol 300 q.d.    Nexium 40 mg daily.    Claritin 10 mg daily.    Ditropan XL 10 mg daily.    Losartan 25 daily.    Lasix 60 daily.    Lexapro 10 q.d.    K-dur 20 mEq twice daily.    Calcium carbonate 600 twice daily.     Mucinex 600 twice daily.     Percocet 10/325, 1 tablet twice daily.    Cyclobenzaprine 10 mg three times a day.    I believe she has been on chronic prednisone 5 mg a day.    Zocor 10 mg q.d.     Pepcid 20 mg q.h.s.     Gabapentin 100 mg three times a day.  Past Medical History  Diagnosis Date  . Bronchitis   . Hypercholesterolemia   . Hypertensive heart disease   . GERD (gastroesophageal reflux disease)   . Osteoarthritis   . Gout   . Cerebral palsy   . Diabetes mellitus without complication   . PAD (peripheral artery disease)   . Glaucoma   . Neuropathy   . CKD (chronic kidney disease)   . Ejection  fraction      REVIEW OF SYSTEMS:     CHEST/RESPIRATORY:  States she is still coughing, although I think this is some better.   GI:  No clear dyspepsia, dysphagia, or reflux symptoms.    PHYSICAL EXAMINATION:      GENERAL APPEARANCE:  The patient is not in any distress.    CHEST/RESPIRATORY:  Decreased air entry bilaterally.   There are coarse crackles in the left greater than right lower lobe.   CARDIOVASCULAR:  CARDIAC:   Heart sounds are normal.  No signs of heart failure.    ASSESSMENT/PLAN:                   Gastroesophageal reflux disease.  She is on maximum acid suppression with Pepcid and Nexium.   Cough.  This is mostly nocturnal.  The exact etiology of this is unclear.  She does carry a diagnosis of asthma and I have wondered if this was the issue.  It might account for why the steroids helped somewhat.  I had asked for  x-rays and sinus films last month, although I do not see these.  I will need to follow up on this.     CPT CODE: 52481

## 2014-10-28 ENCOUNTER — Other Ambulatory Visit: Payer: Self-pay | Admitting: Internal Medicine

## 2014-10-28 DIAGNOSIS — R053 Chronic cough: Secondary | ICD-10-CM

## 2014-10-28 DIAGNOSIS — R05 Cough: Secondary | ICD-10-CM

## 2014-11-01 ENCOUNTER — Other Ambulatory Visit (HOSPITAL_COMMUNITY): Payer: Medicaid Other

## 2014-11-01 ENCOUNTER — Non-Acute Institutional Stay (SKILLED_NURSING_FACILITY): Payer: Medicare Other | Admitting: Internal Medicine

## 2014-11-01 DIAGNOSIS — R05 Cough: Secondary | ICD-10-CM

## 2014-11-01 DIAGNOSIS — K219 Gastro-esophageal reflux disease without esophagitis: Secondary | ICD-10-CM

## 2014-11-01 DIAGNOSIS — R059 Cough, unspecified: Secondary | ICD-10-CM

## 2014-11-04 ENCOUNTER — Other Ambulatory Visit: Payer: Self-pay | Admitting: *Deleted

## 2014-11-04 MED ORDER — OXYCODONE-ACETAMINOPHEN 10-325 MG PO TABS: ORAL_TABLET | ORAL | Status: DC

## 2014-11-04 NOTE — Telephone Encounter (Signed)
[facility] 

## 2014-11-05 NOTE — Progress Notes (Addendum)
Patient ID: [name] [name], female   DOB: 09-18-1946, 69 y.o.   MRN: [id number]               PROGRESS NOTE  DATE:  11/01/2014                          FACILITY: [facility]                   LEVEL OF CARE:   SNF   Acute Visit   CHIEF COMPLAINT:  Follow up cough.    HISTORY OF PRESENT ILLNESS:  I have been checking in on [name] [name] with regards to her chronic upper airway cough syndrome.  She has been managed by eliminating Advair and generic Losartan, and acid suppression with Nexium and Pepcid.  She had a swallowing study which she passed well.  I think this issue seems to be mostly nocturnal.  I do not see her coughing during the day, although she says she does.  There are no other new symptoms.    PHYSICAL EXAMINATION:      VITAL SIGNS:   RESPIRATIONS:  18.   O2 SATURATIONS:  96% on room air.   GENERAL APPEARANCE:  The patient is not in any distress.   CARDIOVASCULAR:  CARDIAC:   Heart sounds are normal.   CHEST/RESPIRATORY:  Other than mildly shallow air entry, sounds fairly clear with no wheezing.     ASSESSMENT/PLAN:                   Chronic cough syndrome.  This does not seem bad during the day.  In fact, it is quite a bit improved, although the patient states this is still a problem at night.  She did see Cardiology, who felt she had heart failure.  I never really saw this.  I think the problem is largely improved.  I would wonder about a nocturnal asthma syndrome, as well.

## 2014-11-17 ENCOUNTER — Ambulatory Visit (HOSPITAL_COMMUNITY)
Admission: RE | Admit: 2014-11-17 | Discharge: 2014-11-17 | Disposition: A | Discharge status: Home / Self Care | Payer: Medicare Other | Source: Ambulatory Visit | Attending: Internal Medicine | Admitting: Internal Medicine

## 2014-11-17 DIAGNOSIS — R05 Cough: Secondary | ICD-10-CM

## 2014-11-17 DIAGNOSIS — R053 Chronic cough: Secondary | ICD-10-CM

## 2014-11-22 ENCOUNTER — Non-Acute Institutional Stay (SKILLED_NURSING_FACILITY): Payer: Medicare Other | Admitting: Internal Medicine

## 2014-11-22 DIAGNOSIS — R05 Cough: Secondary | ICD-10-CM

## 2014-11-22 DIAGNOSIS — R059 Cough, unspecified: Secondary | ICD-10-CM

## 2014-11-22 DIAGNOSIS — E1140 Type 2 diabetes mellitus with diabetic neuropathy, unspecified: Secondary | ICD-10-CM

## 2014-11-26 NOTE — Progress Notes (Addendum)
Patient ID: [name] [name], female   DOB: 29-Aug-1946, 69 y.o.   MRN: [id number]              PROGRESS NOTE  DATE:  11/22/2014                 FACILITY: [facility]                           LEVEL OF CARE:   SNF   Routine Visit   CHIEF COMPLAINT:  Review of general medical issues/[facility] visit.    HISTORY OF PRESENT ILLNESS:  This is a patient whose primary disability is secondary to cerebral palsy.  Nevertheless, she is very functional in the facility, pushes herself around in the wheelchair.    Since the fall, she started to complain of cough and shortness of breath on exertion.  She has seen both Cardiology and Pulmonology.  She was felt to be in heart failure by Cardiology.  However, I did not really think there was much evidence of this.  Pulmonology thought she had a classic upper airway cough syndrome.  I am concerned that the patient actually has asthma.    PAST MEDICAL HISTORY/PROBLEM LIST:                        Cerebral palsy.    Osteoarthritis.    Hypertension.    Gastroesophageal reflux disease.    Recurrent bronchitis.    Colitis of uncertain etiology.    Type 2 diabetes with polyneuropathy.    Chronic venous stasis.    Obesity.    CURRENT MEDICATIONS:   Medication list is reviewed.       Allopurinol 300 q.d.     Nexium 40 q.d.      Claritin 10 q.d.       Patanol ophthalmic.    Ditropan XL 10 mg every day.     Cozaar 25 q.d.      Lasix 60 q.d.      Escitalopram 10 mg daily.    Prednisone 5 q.d.      K-dur 20 mEq twice daily.    Calcium carbonate 600 twice daily.     Mucinex 600 twice daily.     Pepcid 20 q.d.      Flexeril 10 q.d.      Neurontin 100 three times daily.     Percocet 10/325, 1 tablet three times daily.    DuoNebs four times daily.      Zocor 10 q.d.      Vitamin D3, 50,000 U.    REVIEW OF SYSTEMS:          CHEST/RESPIRATORY:  The patient states the cough is much better.  Only really occurs at night.  She has less  shortness of breath.    CARDIAC:   No chest pain.     GI:  No nausea, vomiting or diarrhea.      PHYSICAL EXAMINATION:                           VITAL SIGNS:    TEMPERATURE:  97.6.   BLOOD PRESSURE:  125/76.   O2 SATURATIONS:   96% on room air.     CHEST/RESPIRATORY:  Air entry is fairly good.  However, there is a prolonged expiratory phase and expiratory wheezing.    CARDIOVASCULAR:  CARDIAC:   Heart sounds are normal.  There is no increase in jugular venous pressure.    ASSESSMENT/PLAN:                      Type 2 diabetes with polyneuropathy.  Her hemoglobin A1c was 6, on metformin.     I think likely asthma versus gastroesophageal reflux, maybe both.

## 2014-12-05 ENCOUNTER — Other Ambulatory Visit: Payer: Self-pay | Admitting: *Deleted

## 2014-12-05 MED ORDER — OXYCODONE-ACETAMINOPHEN 10-325 MG PO TABS: ORAL_TABLET | ORAL | Status: DC

## 2014-12-05 NOTE — Telephone Encounter (Signed)
[facility] 

## 2014-12-13 ENCOUNTER — Non-Acute Institutional Stay (SKILLED_NURSING_FACILITY): Payer: Medicare Other | Admitting: Internal Medicine

## 2014-12-13 DIAGNOSIS — J948 Other specified pleural conditions: Secondary | ICD-10-CM

## 2014-12-16 ENCOUNTER — Other Ambulatory Visit: Payer: Self-pay | Admitting: Internal Medicine

## 2014-12-16 DIAGNOSIS — J929 Pleural plaque without asbestos: Secondary | ICD-10-CM

## 2014-12-16 NOTE — Progress Notes (Addendum)
Patient ID: [name] [name], female   DOB: [birth year], 69 y.o.   MRN: [id number]               PROGRESS NOTE  DATE:  12/13/2014              FACILITY: [facility]       LEVEL OF CARE:   SNF   Acute Visit   CHIEF COMPLAINT:  Follow up chest x-ray abnormality, cough, etc.          HISTORY OF PRESENT ILLNESS:  Mrs. Karpf went over to [facility] for a PA and lateral chest x-ray, which I think was done sitting.  This was compared with a previous CT scan in January 2012 as well as an x-ray on 12/17/2010.  It was felt in the lower lobe laterally that there was a pleural plaque which was not present on prior exams.  Indeed, the portable films done here did not comment on this, either.  I did review the CT scan of her chest done on 11/18/2010.   This showed mild esophageal dilation, fairly marked narrowing of the trachea without foreign body.  There were no other acute pulmonary findings.  I have also looked at the actual image of the chest x-ray itself.  She has bilateral atelectasis.  Indeed, there appears to be a calcified pleural plaque in the left lower lobe.  On the lateral view, I think this is anterior, although I think this was sitting.  A PA and lateral chest x-ray upright was suggested, although the patient will not be able to do this.    The patient continues to cough.  I have discussed this with both her and the attendant who looks after her.  Apparently, she is bringing up small amounts of sputum.        PHYSICAL EXAMINATION:              GENERAL APPEARANCE:  The patient is not in any distress.     CHEST/RESPIRATORY:  Her air entry is mildly reduced, but not too bad.  There may be an inspiratory noise here, as well, but certainly nothing over the trachea.    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  An upright PA and lateral chest x-ray was suggested.  She will not be able to do this.  In view of the tracheal abnormalities  in 2012, I think a repeat CT scan of the chest is indicated.  I also note that the patient went for CT scan of her sinuses.  An ENT follow-up showed no acute abnormality.  Her paranasal sinuses were clear.

## 2014-12-22 ENCOUNTER — Ambulatory Visit (HOSPITAL_COMMUNITY)
Admission: RE | Admit: 2014-12-22 | Discharge: 2014-12-22 | Disposition: A | Discharge status: Home / Self Care | Payer: Medicaid Other | Source: Ambulatory Visit | Attending: Internal Medicine | Admitting: Internal Medicine

## 2014-12-22 ENCOUNTER — Encounter (HOSPITAL_COMMUNITY): Payer: Self-pay

## 2014-12-22 DIAGNOSIS — E780 Pure hypercholesterolemia: Secondary | ICD-10-CM | POA: Insufficient documentation

## 2014-12-22 DIAGNOSIS — R05 Cough: Secondary | ICD-10-CM | POA: Diagnosis not present

## 2014-12-22 DIAGNOSIS — J929 Pleural plaque without asbestos: Secondary | ICD-10-CM

## 2014-12-27 ENCOUNTER — Non-Acute Institutional Stay (SKILLED_NURSING_FACILITY): Payer: Medicare Other | Admitting: Internal Medicine

## 2014-12-27 DIAGNOSIS — R05 Cough: Secondary | ICD-10-CM

## 2014-12-27 DIAGNOSIS — J948 Other specified pleural conditions: Secondary | ICD-10-CM

## 2014-12-27 DIAGNOSIS — J4521 Mild intermittent asthma with (acute) exacerbation: Secondary | ICD-10-CM

## 2014-12-27 DIAGNOSIS — R059 Cough, unspecified: Secondary | ICD-10-CM

## 2014-12-27 NOTE — Progress Notes (Signed)
Patient ID: [name] [name], female   DOB: 1946-02-12, 69 y.o.   MRN: [id number] Facility; [facility] Chief complaint; follow-up cough, CT scan of the chest History; I saw [name] [name] today in follow-up from her CT scan of the chest this was done because a plain x-ray showed the possibility of a pleural plaque. She also had a CT scan of the chest in 2012 which suggested marked narrowing of the trachea. Her current CT scan did not show any major any a intraparenchymal disease. She did have some subpleural thickening although I don't think that was even felt to be that impressive. The patient states her coughing is a lot better it is not keeping her at up at night. Pulmonology had thought that this was upper airway cough syndrome I wondered whether she might have asthma especially nocturnal asthma. She is been treated fairly aggressively for the latter. She is also been to see cardiology who felt she might have heart failure although I never saw a lot of evidence of this. She did pass a swallowing study without any evidence of aspiration.  Current medications include allopurinol 300 daily Nexium 40 daily Claritin 10 daily Ditropan XL 10 daily Cozaar 25 daily Lasix 60 mg daily Prednisone 5 mg daily K Dur 20 mEq twice a day Pepcid 20 daily Flexeril 10 mg 3 times a day Neurontin 903 I 3 times a day Oxycodone/APAP 10/325 3 times a day To run abscess 4 times daily scheduled Zocor 10 daily Vitamin D3 50,000 units monthly Metformin 500 twice a day  On examination Gen. the patient is not in any distres  respiratory slightly shallow air entry but no crackles no wheezes this is quite a bit better than some months ago. Cardiac heart sounds are normal there is no signs of congestive heart failure.  Impression/plan #1 chronic cough question upper airway syndrome versus nocturnal asthma this seems a lot better. I'm going to try to taper the prednisone to off. A lot of her current medications were started related  to this including her PPI Pepcid do an abscess. Wonder if some of this can be tapered.

## 2015-01-09 ENCOUNTER — Other Ambulatory Visit: Payer: Self-pay | Admitting: *Deleted

## 2015-01-09 MED ORDER — OXYCODONE-ACETAMINOPHEN 10-325 MG PO TABS: ORAL_TABLET | ORAL | Status: DC

## 2015-01-09 NOTE — Telephone Encounter (Signed)
[facility] 

## 2015-02-07 ENCOUNTER — Non-Acute Institutional Stay (SKILLED_NURSING_FACILITY): Payer: Medicare Other | Admitting: Internal Medicine

## 2015-02-07 DIAGNOSIS — R05 Cough: Secondary | ICD-10-CM

## 2015-02-07 DIAGNOSIS — E1140 Type 2 diabetes mellitus with diabetic neuropathy, unspecified: Secondary | ICD-10-CM

## 2015-02-07 DIAGNOSIS — R059 Cough, unspecified: Secondary | ICD-10-CM

## 2015-02-08 ENCOUNTER — Other Ambulatory Visit: Payer: Self-pay | Admitting: *Deleted

## 2015-02-08 MED ORDER — OXYCODONE-ACETAMINOPHEN 10-325 MG PO TABS: ORAL_TABLET | ORAL | Status: DC

## 2015-02-12 NOTE — Progress Notes (Signed)
Patient ID: [name] [name], female   DOB: 04-10-1946, 69 y.o.   MRN: [id number]                PROGRESS NOTE  DATE:  02/07/2015             FACILITY: [facility]                           LEVEL OF CARE:   SNF   Routine Visit                       CHIEF COMPLAINT:  Review of medical issues/[employer] visit.       HISTORY OF PRESENT ILLNESS:  This is a patient whose primary disability is secondary to cerebral palsy.  Nevertheless, she is reasonably functional in the facility, pushes herself around in the wheelchair.    She had a protracted course of problems with mostly nocturnal cough over the fall and early part of the winter.  She saw both Cardiology and Pulmonology.  Pulmonary thought she had classic upper airway cough/postnasal drip.  I was concerned about asthma.  She was then worked up for the finding of a pleural plaque.  She had a CT scan of the chest that did not show anything concerning.    PAST MEDICAL HISTORY/PROBLEM LIST:                             Cerebral palsy.    Osteoarthritis.      Hypertension.    Gastroesophageal reflux.    Recurrent bronchitis.    Colitis of uncertain etiology.    Type 2 diabetes with polyneuropathy.    Chronic venous stasis.    Obesity.    Refractory cough as noted, which seems to have abated.    CURRENT MEDICATIONS:  Medication list is reviewed.                       Allopurinol 300 a day for gout.    Nexium 40 q.d.     Claritin 10 q.d.        Patanol ophthalmic.    Ditropan XL 10 q.d.          Cozaar 25 q.d.         Lasix 60 daily.     Escitalopram 10 mg a day for depression.     Prednisone 5 mg daily.       K-Dur 20 mEq b.i.d.        Systane ophthalmic.    Calcium carbonate 1 tablet twice daily.      Pepcid 20 twice daily.       Flexeril 10 mg three times a day for spasms.    Neurontin 100 t.i.d.       Percocet 10/325, 1 tablet three times daily.      Duonebs routinely q.i.d.        Zocor 10 q.d.         Vitamin D3, 50,000 U every month.    REVIEW OF SYSTEMS:    CHEST/RESPIRATORY:  It is not clear to me that she is really coughing anymore.  She is not complaining of shortness of breath.   CARDIAC:  No chest pain.     GI:  No constipation.  No diarrhea.  No abdominal pain.        PHYSICAL EXAMINATION:  VITAL SIGNS:   O2 SATURATIONS:  90% on room air.    RESPIRATIONS:    18 and unlabored.   PULSE:     92.   CHEST/RESPIRATORY:  Really fairly benign/clear air entry.  No wheezing.  No stridor.  No accessory muscle use.       CARDIOVASCULAR:   CARDIAC:  Heart sounds are normal.  There are no signs of congestive heart failure.      GASTROINTESTINAL:   ABDOMEN:  Distended.  No masses are noted.    LIVER/SPLEEN/KIDNEYS:  No liver, no spleen.    ASSESSMENT/PLAN:                Chronic cough.  This seems to have abated.  Probably secondary to an upper airway cough syndrome.  I tapered the prednisone last month.  She seems to have tolerated that.    On a high dose of Lasix.   I am not exactly sure of the history here.  She had a reasonably normal echocardiogram.        CPT CODE: 09983

## 2015-02-17 ENCOUNTER — Ambulatory Visit (INDEPENDENT_AMBULATORY_CARE_PROVIDER_SITE_OTHER): Payer: Medicare Other | Admitting: Cardiology

## 2015-02-17 ENCOUNTER — Encounter: Payer: Self-pay | Admitting: Cardiology

## 2015-02-17 VITALS — BP 126/84 | HR 96 | Ht 66.0 in

## 2015-02-17 DIAGNOSIS — R059 Cough, unspecified: Secondary | ICD-10-CM

## 2015-02-17 DIAGNOSIS — I5032 Chronic diastolic (congestive) heart failure: Secondary | ICD-10-CM | POA: Diagnosis not present

## 2015-02-17 DIAGNOSIS — R05 Cough: Secondary | ICD-10-CM

## 2015-02-17 DIAGNOSIS — I10 Essential (primary) hypertension: Secondary | ICD-10-CM

## 2015-02-17 MED ORDER — VALSARTAN 40 MG PO TABS: 40.0000 mg | ORAL_TABLET | Freq: Every day | ORAL | Status: DC

## 2015-02-17 NOTE — Progress Notes (Signed)
Cardiology Office Note   Date:  02/17/2015   ID:  [name] [name], DOB June 19, 1946, MRN [id number]  PCP:  Cyndee Brightly, MD  Cardiologist:  Dola Argyle, MD   Chief Complaint  Patient presents with  . Appointment    Follow-up shortness of breath      History of Present Illness: [name] [name] is a 69 y.o. female who presents today to follow-up shortness of breath. She has chronic diastolic CHF. Echo in 2015 showed normal systolic function. It has been difficult to control her edema. Her legs are dependent good bit of the time. She's had a persistent cough. I have reviewed older records. In 2015 the patient was seen by Dr. Melvyn Novas of the pulmonary team. He stated that there has been some evidence of cough related to generic losartan, even though it is an ARB. He suggested consideration of using a different ARB. She has a persistent cough. It is probably multifactorial.    Past Medical History  Diagnosis Date  . Bronchitis   . Hypercholesterolemia   . Hypertensive heart disease   . GERD (gastroesophageal reflux disease)   . Osteoarthritis   . Gout   . Cerebral palsy   . Diabetes mellitus without complication   . PAD (peripheral artery disease)   . Glaucoma   . Neuropathy   . CKD (chronic kidney disease)   . Ejection fraction     History reviewed. No pertinent past surgical history.  Patient Active Problem List   Diagnosis Date Noted  . Chronic diastolic CHF (congestive heart failure) 05/27/2014  . Cough 05/27/2014  . Essential hypertension 05/27/2014  . Ejection fraction   . Hypoxia 04/06/2014  . Shortness of breath 04/04/2014  . Hypercholesterolemia   . Hypertensive heart disease   . GERD (gastroesophageal reflux disease)   . Osteoarthritis   . Gout   . Cerebral palsy   . Diabetes mellitus without complication   . PAD (peripheral artery disease)   . Glaucoma   . Neuropathy   . CKD (chronic kidney disease)       Current Outpatient Prescriptions    Medication Sig Dispense Refill  . acetaminophen (TYLENOL) 500 MG tablet Take 500 mg by mouth every 6 (six) hours as needed (PAIN).    Marland Kitchen albuterol (PROVENTIL) (2.5 MG/3ML) 0.083% nebulizer solution Take 2.5 mg by nebulization every 4 (four) hours as needed for wheezing or shortness of breath (WHEEZING).    Marland Kitchen allopurinol (ZYLOPRIM) 300 MG tablet Take 300 mg by mouth daily.    . benzonatate (TESSALON) 100 MG capsule Take 100 mg by mouth 3 (three) times daily as needed for cough.     . calcium-vitamin D (OSCAL WITH D) 500-200 MG-UNIT per tablet Take 1 tablet by mouth 2 (two) times daily.    . cyclobenzaprine (FLEXERIL) 10 MG tablet Take 10 mg by mouth 3 (three) times daily.     Marland Kitchen dextromethorphan (DELSYM) 30 MG/5ML liquid Take 60 mg by mouth every 12 (twelve) hours as needed for cough.     . ergocalciferol (VITAMIN D2) 50000 UNITS capsule Take 50,000 Units by mouth every 30 (thirty) days.    Marland Kitchen escitalopram (LEXAPRO) 5 MG tablet Take 5 mg by mouth daily.    Marland Kitchen esomeprazole (NEXIUM) 40 MG capsule Take 40 mg by mouth daily at 12 noon.    . furosemide (LASIX) 40 MG tablet Take 1.5 tablets (60 mg total) by mouth daily. 45 tablet 6  . gabapentin (NEURONTIN) 100 MG capsule Take 100  mg by mouth 3 (three) times daily.    Marland Kitchen guaiFENesin (MUCINEX) 600 MG 12 hr tablet Take 600 mg by mouth 2 (two) times daily.    Marland Kitchen ibuprofen (ADVIL,MOTRIN) 800 MG tablet Take 800 mg by mouth as needed.    Marland Kitchen ipratropium (ATROVENT) 0.02 % nebulizer solution Take 0.5 mg by nebulization every 4 (four) hours as needed for wheezing or shortness of breath (SHORTNESS OF BREATH).    Marland Kitchen lactulose (CHRONULAC) 10 GM/15ML solution Take 30 g by mouth daily as needed for mild constipation.    Marland Kitchen loratadine (CLARITIN) 10 MG tablet Take 10 mg by mouth daily.    Marland Kitchen LORazepam (ATIVAN) 0.5 MG tablet Take 0.5 mg by mouth 2 (two) times daily as needed for anxiety.    . metFORMIN (GLUCOPHAGE) 500 MG tablet Take 500 mg by mouth 2 (two) times daily with a  meal.    . methocarbamol (ROBAXIN) 500 MG tablet Take 500 mg by mouth every 6 (six) hours as needed for muscle spasms (MUSCLE SPASMS).    . naproxen (NAPROSYN) 500 MG tablet Take 500 mg by mouth 2 (two) times daily with a meal.    . olopatadine (PATANOL) 0.1 % ophthalmic solution 1 drop 2 (two) times daily.    Marland Kitchen oxybutynin (DITROPAN-XL) 10 MG 24 hr tablet Take 10 mg by mouth at bedtime.    Marland Kitchen oxyCODONE-acetaminophen (PERCOCET) 10-325 MG per tablet Take one tablet by mouth three times daily for pain. Do not exceed 4gm of Tylenol in 24 hours 90 tablet 0  . Polyethyl Glycol-Propyl Glycol (SYSTANE OP) Apply 1 drop to eye 2 (two) times daily.    . potassium chloride SA (K-DUR,KLOR-CON) 20 MEQ tablet Take 20 mEq by mouth 2 (two) times daily.    . promethazine (PHENERGAN) 25 MG tablet Take 25 mg by mouth daily as needed for nausea or vomiting.    . simvastatin (ZOCOR) 10 MG tablet Take 10 mg by mouth at bedtime.    . valsartan (DIOVAN) 40 MG tablet Take 1 tablet (40 mg total) by mouth daily. 30 tablet 6   No current facility-administered medications for this visit.    Allergies:   Miconazole    Social History:  The patient  reports that she quit smoking about 11 years ago. Her smoking use included Cigarettes. She has a 80 pack-year smoking history. She has never used smokeless tobacco. She reports that she does not drink alcohol or use illicit drugs.   Family History:  The patient's family history includes Heart disease in her father and mother.    ROS:  Please see the history of present illness.   Patient is able to communicate, however her speech pattern is difficult to assess. She denies fever, chills, headache, sweats, change in vision, change in hearing, chest pain, nausea or vomiting, urinary symptoms. She does have lower extremity edema. She has redness on the left lower leg. All other systems are reviewed and are negative.     PHYSICAL EXAM: VS:  BP 126/84 mmHg  Pulse 96  Ht 5\' 6"  (1.676  m)  Wt  , Patient is significantly overweight. She is in a wheelchair. I am unable to understand her speech listening carefully. Head is atraumatic. Sclera and conjunctiva are normal. There is no jugular venous distention. Lungs revealed diffuse rhonchi. She had a coughing episode while I was in the room. There is no significant sputum. There is no respiratory distress. Cardiac exam reveals S1 and S2. The abdomen is protuberant. She  has bilateral edema that is chronic. There is some discoloration of the skin of the left lower extremity. This is not inflamed.  EKG:   EKG is done today and reviewed by me. There is sinus rhythm. There is no significant change.   Recent Labs: 06/06/2014: BUN 31*; Creatinine 1.3*; Potassium 4.3; Sodium 139    Lipid Panel No results found for: CHOL, TRIG, HDL, CHOLHDL, VLDL, LDLCALC, LDLDIRECT    Wt Readings from Last 3 Encounters:  No data found for Wt     I have reviewed the medications as listed at her nursing facility.     ASSESSMENT AND PLAN:

## 2015-02-17 NOTE — Assessment & Plan Note (Signed)
The patient has a persistent cough. She had been changed from an ACE inhibitor to losartan in the past. Dr. Melvyn Novas of the pulmonary team had noted that there are some reports of cough from the generic of this specific ARB. He suggested considering a change. Today I am changing her to low-dose valsartan.

## 2015-02-17 NOTE — Patient Instructions (Signed)
**Note De-Identified [name] [name] Obfuscation** Medication Instructions:  Stop taking Losartan and start taking Valsartan 40 mg daily  Labwork: None  Testing/Procedures: None  Follow-Up: Your physician wants you to follow-up in: 6 months. You will receive a reminder letter in the mail two months in advance. If you don't receive a letter, please call our office to schedule the follow-up appointment.   Any Other Special Instructions Will Be Listed Below (If Applicable). Please send a clear medication list with the pt as we cant understand the one given to [place] today. Thank you.

## 2015-02-17 NOTE — Assessment & Plan Note (Signed)
The patient has diastolic CHF. She does have edema. It may be helpful to try to push her diuretics. However, I believe some of her edema is chronic.

## 2015-02-17 NOTE — Assessment & Plan Note (Signed)
Blood pressures controlled. No change in therapy. 

## 2015-03-07 ENCOUNTER — Non-Acute Institutional Stay (SKILLED_NURSING_FACILITY): Payer: Medicare Other | Admitting: Internal Medicine

## 2015-03-07 DIAGNOSIS — I5032 Chronic diastolic (congestive) heart failure: Secondary | ICD-10-CM | POA: Diagnosis not present

## 2015-03-07 DIAGNOSIS — R059 Cough, unspecified: Secondary | ICD-10-CM

## 2015-03-07 DIAGNOSIS — R05 Cough: Secondary | ICD-10-CM

## 2015-03-10 ENCOUNTER — Other Ambulatory Visit: Payer: Self-pay | Admitting: *Deleted

## 2015-03-10 MED ORDER — OXYCODONE-ACETAMINOPHEN 10-325 MG PO TABS: ORAL_TABLET | ORAL | Status: DC

## 2015-03-10 NOTE — Telephone Encounter (Signed)
[facility] 

## 2015-03-12 NOTE — Progress Notes (Addendum)
Patient ID: [name] [name], female   DOB: May 23, 1946, 69 y.o.   MRN: [id number]                PROGRESS NOTE  DATE:  03/07/2015          FACILITY: [facility]                       LEVEL OF CARE:   SNF   Routine Visit              CHIEF COMPLAINT:  Review of medical issues/[employer] visit.          HISTORY OF PRESENT ILLNESS:  This is a patient whose primary disability is secondary to cerebral palsy.  Nevertheless, she is functional in the building, gets herself around the facility in her electric wheelchair.    She had a protracted course of problems with mostly a nocturnal cough over the fall and early part of winter 2016.  She saw both Cardiology (Dr. Ron Parker) and Pulmonology (Dr. Melvyn Novas).  Pulmonary thought she had classic upper airway cough syndrome.  There was also some concern about the generic of Losartan, although my understanding is that she was not on the generic.  However, I was concerned about nocturnal asthma given some wheezing when I saw her earlier in the day.  However, the cough has largely abated quite a bit.  She had a CT scan of the chest that did not show anything concerning.    PAST MEDICAL HISTORY/PROBLEM LIST:                    Cerebral palsy.      Osteoarthritis.     Hypertension.       Gastroesophageal reflux.    Recurrent bronchitis.    Colitis of uncertain etiology.    Type 2 diabetes with polyneuropathy.    Chronic venous stasis.      Obesity.      Refractory cough as noted, which seems to have abated.    Dr. Kae Heller note makes references to diastolic heart failure.  Her last echo was in August 2015 which showed an ejection fraction of 60%, mild LVH.        CURRENT MEDICATIONS:  Medication list is reviewed.               Allopurinol 300 mg a day.       Nexium 40 q.d.       Claritin 10 q.d.       Patanol ophthalmic.    Changed to Losartan 40 mg daily.    Ditropan XL 10 mg daily.       Lasix 60 mg daily.        Lexapro 10 q.d.        K-Dur 20 mEq twice a day.    Systane ophthalmic.    Calcium carbonate 600 b.i.d.       Mucinex 600 b.i.d.       Pepcid 20 q.d.        Flexeril 10 mg three times daily for back spasms.    Neurontin 100 three times daily.    Percocet 10/325 three times daily.      DuoNebs four times a day.      Zocor 10 q.d.      Vitamin D3, 50,000 U daily.      LABORATORY DATA:  Lab work on 02/10/2015:    White count 7.4, hemoglobin 11.4, platelet count 322.  Uric acid level 3.3.      TSH normal at 2.034.    On 01/20/2015:  Hemoglobin A1c 6.7.    On 12/14/2014:  Comprehensive metabolic panel was normal.       REVIEW OF SYSTEMS:    CHEST/RESPIRATORY:  The patient states she still has some mostly nocturnal cough, some cough during the day.  She is not complaining of shortness of breath.   CARDIAC:  No clear chest pain.   GI:  No constipation.  No diarrhea.     PHYSICAL EXAMINATION:    VITAL SIGNS:     PULSE:  92.   RESPIRATIONS:  18, and unlabored.   02 SATURATIONS:  92% on room air.    CHEST/RESPIRATORY:  Air entry is fairly clear, but with a prolonged expiratory phase.  No accessory muscle use.   CARDIOVASCULAR:   CARDIAC:  Heart sounds are normal.  There are no murmurs.   No signs of heart failure.   GASTROINTESTINAL:   ABDOMEN:  Distended.  No masses are noted.    CIRCULATION:   EDEMA/VARICOSITIES:  Extremities:  Significant venous stasis is noted, but no edema above the knee.  No coccyx edema.     ASSESSMENT/PLAN:                         On reasonably high-dose Lasix  for diastolic heart failure.    Chronic cough.  I think this is either gastroesophageal reflux or maybe nocturnal asthma.  Low threshold of treatment in this woman for an asthmatic exacerbation and in the right setting.      She needs her electrolytes checked and I will order this.

## 2015-04-12 ENCOUNTER — Other Ambulatory Visit: Payer: Self-pay | Admitting: *Deleted

## 2015-04-12 MED ORDER — OXYCODONE-ACETAMINOPHEN 10-325 MG PO TABS: ORAL_TABLET | ORAL | Status: DC

## 2015-04-12 NOTE — Telephone Encounter (Signed)
[facility] 

## 2015-05-11 ENCOUNTER — Other Ambulatory Visit: Payer: Self-pay

## 2015-05-11 MED ORDER — LORAZEPAM 0.5 MG PO TABS: ORAL_TABLET | ORAL | Status: DC

## 2015-05-11 MED ORDER — OXYCODONE-ACETAMINOPHEN 10-325 MG PO TABS: ORAL_TABLET | ORAL | Status: DC

## 2015-05-11 NOTE — Telephone Encounter (Signed)
Rx faxed to [facility] @ [phone], phone number [phone]  Lorazepam rx was sent in error, rx shredded.

## 2015-06-02 ENCOUNTER — Other Ambulatory Visit: Payer: Self-pay | Admitting: *Deleted

## 2015-06-02 MED ORDER — OXYCODONE-ACETAMINOPHEN 10-325 MG PO TABS: ORAL_TABLET | ORAL | Status: DC

## 2015-06-02 NOTE — Telephone Encounter (Signed)
[facility] 

## 2015-07-10 ENCOUNTER — Other Ambulatory Visit: Payer: Self-pay | Admitting: *Deleted

## 2015-07-10 MED ORDER — OXYCODONE-ACETAMINOPHEN 10-325 MG PO TABS: ORAL_TABLET | ORAL | Status: DC

## 2015-07-10 NOTE — Telephone Encounter (Signed)
[facility] 

## 2015-07-18 ENCOUNTER — Non-Acute Institutional Stay (SKILLED_NURSING_FACILITY): Payer: Medicare Other | Admitting: Internal Medicine

## 2015-07-18 DIAGNOSIS — N182 Chronic kidney disease, stage 2 (mild): Secondary | ICD-10-CM

## 2015-07-18 DIAGNOSIS — D509 Iron deficiency anemia, unspecified: Secondary | ICD-10-CM

## 2015-07-18 DIAGNOSIS — Z794 Long term (current) use of insulin: Secondary | ICD-10-CM | POA: Diagnosis not present

## 2015-07-18 DIAGNOSIS — E0821 Diabetes mellitus due to underlying condition with diabetic nephropathy: Secondary | ICD-10-CM

## 2015-07-22 NOTE — Progress Notes (Addendum)
Patient ID: [name] [name], female   DOB: [birth year], 69 y.o.   MRN: [id number]                PROGRESS NOTE  DATE:  07/18/2015          FACILITY: [facility]                  LEVEL OF CARE:   SNF   Routine Visit            CHIEF COMPLAINT:  Routine visit to follow medical issues/[employer] visit/review of [employer] records.      HISTORY OF PRESENT ILLNESS:  This is a patient whose predominant disability is secondary to cerebral palsy.   Nevertheless, she is functional in the facility, propels herself around in the wheelchair.     In the early part of this year, she had a protracted course of mostly nocturnal cough.  She saw both Cardiology and Pulmonology.  Pulmonology (Dr. Melvyn Novas) thought she had classic upper airway cough/postnasal drip syndrome.  I was concerned about nocturnal asthma.  She saw Dr. Ron Parker of Cardiology, who thought she might have a contribution from diastolic heart failure.   A CT scan of the chest did not show anything concerning.    Recently, she had a fall and there was some concern for a spiral fracture of her fibula.  However, she has been to see Orthopedics today and apparently this is "just a sprain" and she has a Cam walker.    She also has been worked up for anemia this month, her hemoglobin noted to be 11.1.  Her serum iron was 18, TIBC at 333, percent saturation at 5.  Her ferritin was 12.  B12 and folate were normal.    Her hemoglobin A1c was 6.6.       PAST MEDICAL HISTORY/PROBLEM LIST:           Cerebral palsy.    Osteoarthritis.     Hypertension.    Gastroesophageal reflux.    Recurrent bronchitis.    Colitis of uncertain etiology.    Type 2 diabetes with polyneuropathy.    Chronic venous stasis.    Obesity.    Refractory cough, which really seems to have abated.    CURRENT MEDICATIONS:  Medication list is reviewed.                 Allopurinol 300 q.d.       Nexium 40 q.d.    Claritin 10 q.d.       Patanol ophthalmic.    Lasix 60 q.d.       Lexapro 10 q.d.      Losartan 40 q.d.      K-Dur 20 mEq b.i.d.      Systane ophthalmic.    Calcium 600 b.i.d.      Mucinex 600 b.i.d.     Pepcid 20 q.d.     Advair 250/50, 1 puff b.i.d.     Flexeril 10 three times a day.    Gabapentin 100 three times a day.    Percocet 10/325 three times a day.    Zocor 10 q.d.      Vitamin D3, 50,000 U monthly.      Glucophage 500 b.i.d.     REVIEW OF SYSTEMS:    GENERAL:  The patient states she feels well.    CHEST/RESPIRATORY:  Coughing has really stopped.  She does not have any nocturnal cough or wheezing.  CARDIAC:  No chest pain.   GI:  No abdominal pain.   No constipation or diarrhea.     GU:  No dysuria.    MUSCULOSKELETAL:  Extremities:  No musculoskeletal pain.    PHYSICAL EXAMINATION:   VITAL SIGNS:     TEMPERATURE:  97.5.     PULSE:  82.     RESPIRATIONS:  16.     BLOOD PRESSURE:  143/75.   02 SATURATIONS:  96% on room air.     GENERAL APPEARANCE:  The patient is not in any distress.     CHEST/RESPIRATORY:  Clear air entry bilaterally.    CARDIOVASCULAR:   CARDIAC:  Heart sounds are normal.   GASTROINTESTINAL:   ABDOMEN:  Obese.  No masses are noted.    GENITOURINARY:   BLADDER:  No bladder distention.   MUSCULOSKELETAL:   EXTREMITIES:   BILATERAL UPPER EXTREMITIES:   Adequate range of motion in the upper extremities.     BILATERAL LOWER EXTREMITIES:  Virtually no lower extremity movement.   She has no contractures noted.     GAIT/STATION:  She is nonambulatory.  She wears a shoe lift in the left shoe.    ASSESSMENT/PLAN:             Type 2 diabetes with polyneuropathy.   Her last hemoglobin A1c was very stable.   Continues on metformin 500 b.i.d.      Type 2 diabetes with diabetic nephropathy.   This is stage 2.  Estimated GFR is 85.    What appears to be iron deficiency anemia.   We will need to discuss work-up here.    Glaucoma.   Follows with Ophthalmology.     Left ankle sprain after a  fall.  She is in a Cam walker.

## 2015-08-01 ENCOUNTER — Non-Acute Institutional Stay (SKILLED_NURSING_FACILITY): Payer: Medicare Other | Admitting: Internal Medicine

## 2015-08-01 DIAGNOSIS — J4521 Mild intermittent asthma with (acute) exacerbation: Secondary | ICD-10-CM | POA: Diagnosis not present

## 2015-08-08 NOTE — Progress Notes (Addendum)
Patient ID: [name] [name], female   DOB: 12/06/1945, 69 y.o.   MRN: [id number]                PROGRESS NOTE  DATE:  08/01/2015         FACILITY: [facility]                  LEVEL OF CARE:   SNF   Acute Visit            CHIEF COMPLAINT:  Wheezing.      HISTORY OF PRESENT ILLNESS:  This is a patient whose primary disability is secondary to cerebral palsy.  Nevertheless, she is able to propel herself around in the wheelchair in the facility.  She attends activities and is not at all limited.    In the early part of 2016, she developed a protracted, mostly nocturnal cough.  She saw both Cardiology and Pulmonary, Dr. Ron Parker of Cardiology and Dr. Melvyn Novas of Pulmonary.  Dr. Ron Parker felt she might have diastolic heart failure.  Dr. Melvyn Novas thought she had upper airway cough/postnasal drip syndrome.  In general, we worked hard on this in terms of inhalers, different suggestions, and she gradually got better.  I wondered whether she might have either asthma or gastroesophageal reflux resulting in nocturnal symptoms.  In any case, she had been stable for a while.     Today, she is complaining of some wheezing.  She is not coughing.  She is not complaining of chest pain.    Past Medical History  Diagnosis Date  . Bronchitis   . Hypercholesterolemia   . Hypertensive heart disease   . GERD (gastroesophageal reflux disease)   . Osteoarthritis   . Gout   . Cerebral palsy   . Diabetes mellitus without complication   . PAD (peripheral artery disease)   . Glaucoma   . Neuropathy   . CKD (chronic kidney disease)   . Ejection fraction     REVIEW OF SYSTEMS:    HEENT:   No nasal congestion.     CHEST/RESPIRATORY:  Again, notes wheezing.        CARDIAC:  No chest pain.   GI:  No dysphagia.  No reflux.  No abdominal pain.    GU:  No dysuria.    CNS: no new weakness or numbness  PHYSICAL EXAMINATION:   VITAL SIGNS:     PULSE:  94.     RESPIRATIONS:  18.    BLOOD PRESSURE:  129/74.    02  SATURATIONS:  96% on room air.    GENERAL APPEARANCE:  The patient is not in any distress.  Sitting in the front lobby.    CHEST/RESPIRATORY:  Shallow air entry bilaterally.   There is a mild end expiratory wheeze.   There is no accessory muscle use.  Her work of breathing is normal.   CARDIOVASCULAR:   CARDIAC:  Heart sounds are normal.  There is no increase in jugular venous pressure.  There are no murmurs.        GASTROINTESTINAL:   ABDOMEN:  Soft, nontender.   No liver no spleen GU: B;adder is not distended  CIRCULATION:   EDEMA/VARICOSITIES:  Extremities:  No edema.       ASSESSMENT/PLAN:                    I wonder whether this lady has acute, perhaps on chronic, bronchitis or asthma.  I had previously thought about doing pulmonary function  tests on her.  I think she is able to be cooperative with this.  She does not look to be in any distress.   She already has DuoNebs q.6 hours p.r.n., Advair Diskus 250/50.  I will ask them to give her a nebulizer.

## 2015-08-15 NOTE — Progress Notes (Signed)
Cardiology Office Note   Date:  08/16/2015   ID:  [name] [name], DOB [birth year], MRN [id number]  PCP:  Cyndee Brightly, MD  Cardiologist:   Sharol Harness, MD   Chief Complaint  Patient presents with  . New Evaluation    former pt of Dr. Jacqulynn Cadet Katz//pt c/o SOB on minimal exertion, dizziness, and swelling in bilateral legs//no other Sx.      History of Present Illness: [name] [name] is a 69 y.o. female with hypertension, PAD, hyperlipidemia, chronic diastolic heart failure, and Cerebral palsy, who presents for an evaluation of shortness of breath.  She denies feeling short of breath but has noticed some lower extremity edema.  She notes mild orthopnea but sleeps on one pillow and denies PND.  She denies chest pain or palpitations.  She occasionally notes lightheadedness when she tried to stand up or go to the bathroom.  She notes that she feels tired when she gets up to go to the bathroom.  She occasionally has nausea but denies emesis.    She fell recently and broke the L distal fibula.  She has been seen at [facility] and is wearing a CAM walker boot.  She does not like wearing the boot but is compliant.  Past Medical History  Diagnosis Date  . Bronchitis   . Hypercholesterolemia   . Hypertensive heart disease   . GERD (gastroesophageal reflux disease)   . Osteoarthritis   . Gout   . Cerebral palsy   . Diabetes mellitus without complication   . PAD (peripheral artery disease)   . Glaucoma   . Neuropathy   . CKD (chronic kidney disease)   . Ejection fraction     No past surgical history on file.   Current Outpatient Prescriptions  Medication Sig Dispense Refill  . acetaminophen (TYLENOL) 500 MG tablet Take 500 mg by mouth every 6 (six) hours as needed (PAIN).    Marland Kitchen ADVAIR DISKUS 250-50 MCG/DOSE AEPB Inhale 1 puff into the lungs 2 (two) times daily.  0  . albuterol (PROVENTIL) (2.5 MG/3ML) 0.083% nebulizer solution Take 2.5 mg by nebulization  every 4 (four) hours as needed for wheezing or shortness of breath (WHEEZING).    Marland Kitchen allopurinol (ZYLOPRIM) 300 MG tablet Take 300 mg by mouth daily.    . benzonatate (TESSALON) 100 MG capsule Take 100 mg by mouth 3 (three) times daily as needed for cough.     . calcium-vitamin D (OSCAL WITH D) 500-200 MG-UNIT per tablet Take 1 tablet by mouth 2 (two) times daily.    . cyclobenzaprine (FLEXERIL) 10 MG tablet Take 10 mg by mouth 3 (three) times daily.     Marland Kitchen dextromethorphan (DELSYM) 30 MG/5ML liquid Take 60 mg by mouth every 12 (twelve) hours as needed for cough.     . ergocalciferol (VITAMIN D2) 50000 UNITS capsule Take 50,000 Units by mouth every 30 (thirty) days.    Marland Kitchen escitalopram (LEXAPRO) 5 MG tablet Take 5 mg by mouth daily.    Marland Kitchen esomeprazole (NEXIUM) 40 MG capsule Take 40 mg by mouth daily at 12 noon.    . furosemide (LASIX) 40 MG tablet Take 1.5 tablets (60 mg total) by mouth daily. 45 tablet 6  . gabapentin (NEURONTIN) 100 MG capsule Take 100 mg by mouth 3 (three) times daily.    Marland Kitchen guaiFENesin (MUCINEX) 600 MG 12 hr tablet Take 600 mg by mouth 2 (two) times daily.    Marland Kitchen ibuprofen (ADVIL,MOTRIN) 800 MG tablet  Take 800 mg by mouth as needed.    Marland Kitchen ipratropium (ATROVENT) 0.02 % nebulizer solution Take 0.5 mg by nebulization every 4 (four) hours as needed for wheezing or shortness of breath (SHORTNESS OF BREATH).    Marland Kitchen lactulose (CHRONULAC) 10 GM/15ML solution Take 30 g by mouth daily as needed for mild constipation.    Marland Kitchen loratadine (CLARITIN) 10 MG tablet Take 10 mg by mouth daily.    Marland Kitchen LORazepam (ATIVAN) 0.5 MG tablet 1 by mouth every night as bedtime for anxiety 30 tablet 5  . metFORMIN (GLUCOPHAGE) 500 MG tablet Take 500 mg by mouth 2 (two) times daily with a meal.    . methocarbamol (ROBAXIN) 500 MG tablet Take 500 mg by mouth every 6 (six) hours as needed for muscle spasms (MUSCLE SPASMS).    . naproxen (NAPROSYN) 500 MG tablet Take 500 mg by mouth 2 (two) times daily with a meal.    .  olopatadine (PATANOL) 0.1 % ophthalmic solution 1 drop 2 (two) times daily.    Marland Kitchen oxybutynin (DITROPAN-XL) 10 MG 24 hr tablet Take 10 mg by mouth at bedtime.    Marland Kitchen oxyCODONE-acetaminophen (PERCOCET) 10-325 MG per tablet Take one tablet by mouth three times daily for pain. Do not exceed 4gm of Tylenol in 24 hours 90 tablet 0  . Polyethyl Glycol-Propyl Glycol (SYSTANE OP) Apply 1 drop to eye 2 (two) times daily.    . potassium chloride SA (K-DUR,KLOR-CON) 20 MEQ tablet Take 20 mEq by mouth 2 (two) times daily.    . promethazine (PHENERGAN) 25 MG tablet Take 25 mg by mouth daily as needed for nausea or vomiting.    . simvastatin (ZOCOR) 10 MG tablet Take 10 mg by mouth at bedtime.    Carren Rang BALANCE 0.6 % SOLN Place 1 drop into both eyes 2 (two) times daily.  0  . valsartan (DIOVAN) 40 MG tablet Take 1 tablet (40 mg total) by mouth daily. 30 tablet 6   No current facility-administered medications for this visit.    Allergies:   Miconazole    Social History:  The patient  reports that she quit smoking about 11 years ago. Her smoking use included Cigarettes. She has a 80 pack-year smoking history. She has never used smokeless tobacco. She reports that she does not drink alcohol or use illicit drugs.   Family History:  The patient's family history includes Heart disease in her father and mother.    ROS:  Please see the history of present illness.   Otherwise, review of systems are positive for none.   All other systems are reviewed and negative.    PHYSICAL EXAM: VS:  BP 132/74 mmHg  Pulse 78  Ht 5\' 6"  (1.676 m)  Wt  , BMI There is no weight on file to calculate BMI. GENERAL:  Well appearing HEENT:  Pupils equal round and reactive, fundi not visualized, oral mucosa unremarkable NECK:  No jugular venous distention, waveform within normal limits, carotid upstroke brisk and symmetric, no bruits, no thyromegaly LYMPHATICS:  No cervical adenopathy LUNGS:  Clear to auscultation  bilaterally HEART:  Distant heart sounds.  RRR.  PMI not displaced or sustained,S1 and S2 within normal limits, no S3, no S4, no clicks, no rubs, no murmurs ABD:  Flat, positive bowel sounds normal in frequency in pitch, no bruits, no rebound, no guarding, no midline pulsatile mass, no hepatomegaly, no splenomegaly EXT:  2 plus pulses throughout, no edema, no cyanosis no clubbing SKIN:  No rashes  no nodules NEURO:  Cranial nerves II through XII grossly intact, motor grossly intact throughout PSYCH:  Cognitively intact, oriented to person place and time    EKG:  EKG is ordered today. The ekg ordered today demonstrates sinus rhythm at 78 bpm.  Low voltage.  Echo 05/27/14: Study Conclusions  - Left ventricle: Technically difficult study. The cavity size was normal. Wall thickness was increased in a pattern of mild LVH. The estimated ejection fraction was 60%. Regional wall motion abnormalities cannot be excluded. - Left atrium: The atrium was mildly dilated. - Right ventricle: The cavity size was mildly dilated. Systolic function was normal.  Recent Labs: No results found for requested labs within last 365 days.    Lipid Panel No results found for: CHOL, TRIG, HDL, CHOLHDL, VLDL, LDLCALC, LDLDIRECT    Wt Readings from Last 3 Encounters:  No data found for Wt      ASSESSMENT AND PLAN:  # Chronic diastolic heart failure: [name] [name] has chronic diastolic heart failure and has some lower extremity edema. She does not appear to have much intravascular volume overload. Her neck veins are not elevated. She does have some wheezing on exam is benign crackles. I suspect that much of her edema is due to the fact that her legs are dependent most of the day. We will check a basic metabolic panel and a BNP. If her BNP is elevated and her renal function and electron stable we will increase her furosemide. Will not make any changes at this time until we have that additional data. She will  continue on valsartan.  Continue furosemide.  # Hypertension: Blood pressure is well controlled today. Continue losartan and furosemide.  # Hyperlipidemia: We will check her lipid panel today. Continue simvastatin.   Current medicines are reviewed at length with the patient today.  The patient does not have concerns regarding medicines.  The following changes have been made:  no change  Labs/ tests ordered today include:  No orders of the defined types were placed in this encounter.     Disposition:   FU with Floreine Kingdon C. Oval Linsey, MD in 6 months     Signed, Sharol Harness, MD  08/16/2015 8:58 AM    [place]

## 2015-08-16 ENCOUNTER — Ambulatory Visit: Payer: Medicare Other | Admitting: Cardiovascular Disease

## 2015-08-16 ENCOUNTER — Encounter: Payer: Self-pay | Admitting: Cardiovascular Disease

## 2015-08-16 ENCOUNTER — Ambulatory Visit (INDEPENDENT_AMBULATORY_CARE_PROVIDER_SITE_OTHER): Payer: Medicare Other | Admitting: Cardiovascular Disease

## 2015-08-16 VITALS — BP 132/74 | HR 78 | Ht 66.0 in

## 2015-08-16 DIAGNOSIS — R0602 Shortness of breath: Secondary | ICD-10-CM | POA: Diagnosis not present

## 2015-08-16 DIAGNOSIS — E785 Hyperlipidemia, unspecified: Secondary | ICD-10-CM

## 2015-08-16 DIAGNOSIS — R600 Localized edema: Secondary | ICD-10-CM | POA: Diagnosis not present

## 2015-08-16 NOTE — Patient Instructions (Signed)
Your physician recommends that you return for lab work at your earliest [place].  Dr Oval Linsey recommends that you schedule a follow-up appointment in 6 months. You will receive a reminder letter in the mail two months in advance. If you don't receive a letter, please call our office to schedule the follow-up appointment.  If you need a refill on your cardiac medications before your next appointment, please call your pharmacy.

## 2015-08-21 ENCOUNTER — Encounter: Payer: Self-pay | Admitting: Cardiovascular Disease

## 2015-10-11 ENCOUNTER — Other Ambulatory Visit: Payer: Self-pay

## 2015-10-11 MED ORDER — OXYCODONE-ACETAMINOPHEN 10-325 MG PO TABS: ORAL_TABLET | ORAL | Status: DC

## 2015-10-11 NOTE — Telephone Encounter (Signed)
Rx faxed to [facility] @ [phone], phone number [phone]  

## 2015-11-09 ENCOUNTER — Other Ambulatory Visit: Payer: Self-pay | Admitting: *Deleted

## 2015-11-09 MED ORDER — OXYCODONE-ACETAMINOPHEN 10-325 MG PO TABS: ORAL_TABLET | ORAL | Status: DC

## 2015-11-09 NOTE — Telephone Encounter (Signed)
[facility] 

## 2015-11-29 ENCOUNTER — Other Ambulatory Visit: Payer: Self-pay

## 2015-11-29 DIAGNOSIS — Z1231 Encounter for screening mammogram for malignant neoplasm of breast: Secondary | ICD-10-CM

## 2015-12-06 ENCOUNTER — Ambulatory Visit
Admission: RE | Admit: 2015-12-06 | Discharge: 2015-12-06 | Disposition: A | Discharge status: Home / Self Care | Payer: Medicare Other | Source: Ambulatory Visit

## 2015-12-06 DIAGNOSIS — Z1231 Encounter for screening mammogram for malignant neoplasm of breast: Secondary | ICD-10-CM

## 2015-12-11 ENCOUNTER — Other Ambulatory Visit: Payer: Self-pay | Admitting: *Deleted

## 2015-12-11 MED ORDER — OXYCODONE-ACETAMINOPHEN 10-325 MG PO TABS: ORAL_TABLET | ORAL | Status: DC

## 2015-12-11 NOTE — Telephone Encounter (Signed)
[facility] 

## 2016-01-10 ENCOUNTER — Other Ambulatory Visit: Payer: Self-pay

## 2016-01-10 MED ORDER — OXYCODONE-ACETAMINOPHEN 10-325 MG PO TABS: ORAL_TABLET | ORAL | Status: DC

## 2016-01-10 NOTE — Telephone Encounter (Signed)
[facility]  [address] [place] [address]  Phone: [phone]  Fax: [phone]  

## 2016-01-22 ENCOUNTER — Encounter: Payer: Self-pay | Admitting: Internal Medicine

## 2016-01-22 ENCOUNTER — Non-Acute Institutional Stay (SKILLED_NURSING_FACILITY): Payer: Medicare Other | Admitting: Internal Medicine

## 2016-01-22 DIAGNOSIS — I5032 Chronic diastolic (congestive) heart failure: Secondary | ICD-10-CM

## 2016-01-22 DIAGNOSIS — R05 Cough: Secondary | ICD-10-CM | POA: Diagnosis not present

## 2016-01-22 DIAGNOSIS — N182 Chronic kidney disease, stage 2 (mild): Secondary | ICD-10-CM

## 2016-01-22 DIAGNOSIS — E7800 Pure hypercholesterolemia, unspecified: Secondary | ICD-10-CM | POA: Diagnosis not present

## 2016-01-22 DIAGNOSIS — R059 Cough, unspecified: Secondary | ICD-10-CM

## 2016-01-22 DIAGNOSIS — E1142 Type 2 diabetes mellitus with diabetic polyneuropathy: Secondary | ICD-10-CM

## 2016-01-22 DIAGNOSIS — L97121 Non-pressure chronic ulcer of left thigh limited to breakdown of skin: Secondary | ICD-10-CM

## 2016-01-22 DIAGNOSIS — I10 Essential (primary) hypertension: Secondary | ICD-10-CM | POA: Diagnosis not present

## 2016-01-22 DIAGNOSIS — L97109 Non-pressure chronic ulcer of unspecified thigh with unspecified severity: Secondary | ICD-10-CM | POA: Insufficient documentation

## 2016-01-22 NOTE — Progress Notes (Signed)
Patient ID: [name] [name], female   DOB: 1946/02/08, 70 y.o.   MRN: [id number]  Location:  [facility] Room Number: 104 Place of Service:  SNF (31) Provider:  Estill Dooms, MD  Patient Care Team: Estill Dooms, MD as PCP - General (Internal Medicine)  Extended Emergency Contact Information Primary Emergency Contact: Hackenberg,Donald  [place] Phone: [phone] Relation: Brother  Code Status:  full Goals of care: Advanced Directive information Advanced Directives 01/22/2016  Does patient have an advance directive? No     Chief Complaint  Patient presents with  . Acute Visit    red, raw abrased area on left posterior thigh    HPI:  Pt is a 70 y.o. female seen today for an acute visit for Evaluation of a red raw area in patient denies any pain. She is afebrile. She is not oozing purulentaterial. Wound care nurse stated she will apply a wound cleaner and foam dressing every 2 days.  There is also a second problem today. She has bronchial rattle cough. Again, she is afebrile. There is no chest pain.  Patient has a history of chronic diastolic congestive heart failure. There is increasing edema of her legs is also chronic kidney disease.    Past Medical History  Diagnosis Date  . Bronchitis   . Hypercholesterolemia   . Hypertensive heart disease   . GERD (gastroesophageal reflux disease)   . Osteoarthritis   . Gout   . Cerebral palsy ([place])   . PAD (peripheral artery disease) ([place])   . Glaucoma   . Neuropathy ([place])   . CKD (chronic kidney disease)   . Ejection fraction   . Chronic diastolic CHF (congestive heart failure) ([place]) 05/27/2014  . Cough 05/27/2014    ?  ACE cough August, 2015?when d/c'd   . DM type 2 with diabetic peripheral neuropathy ([place])    History reviewed. No pertinent past surgical history.  Allergies  Allergen Reactions  . Miconazole       Medication List       This list is accurate as of: 01/22/16  10:28 AM.  Always use your most recent med list.               acetaminophen 325 MG tablet  Commonly known as:  TYLENOL  Take 325 mg by mouth. Take one tablet every 6 hours as needed for pain do not exceed 4 gms in 24 hours     ADVAIR DISKUS 250-50 MCG/DOSE Aepb  Generic drug:  Fluticasone-Salmeterol  Inhale 1 puff into the lungs 2 (two) times daily.     allopurinol 300 MG tablet  Commonly known as:  ZYLOPRIM  Take 300 mg by mouth daily.     calcium-vitamin D 500-200 MG-UNIT tablet  Commonly known as:  OSCAL WITH D  Take 1 tablet by mouth 2 (two) times daily.     Cholecalciferol 50000 units Tabs  Take by mouth. Take one tablet every month on the 17th for osteoporosis     cyclobenzaprine 10 MG tablet  Commonly known as:  FLEXERIL  Take 10 mg by mouth 3 (three) times daily.     escitalopram 5 MG tablet  Commonly known as:  LEXAPRO  Take 5 mg by mouth daily.     famotidine 20 MG tablet  Commonly known as:  PEPCID  Take 20 mg by mouth 2 (two) times daily.     ferrous sulfate 325 (65 FE) MG tablet  Take 325 mg by mouth daily with breakfast.     furosemide 40 MG tablet  Commonly known as:  LASIX  Take 1.5 tablets (60 mg total) by mouth daily.     gabapentin 100 MG capsule  Commonly known as:  NEURONTIN  Take 100 mg by mouth 3 (three) times daily.     guaiFENesin 600 MG 12 hr tablet  Commonly known as:  MUCINEX  Take 600 mg by mouth 2 (two) times daily.     SILTUSSIN SA 100 MG/5ML syrup  Generic drug:  guaifenesin  Take by mouth. 15 ml by mouth every 4 hours as needed for cough     ipratropium 0.02 % nebulizer solution  Commonly known as:  ATROVENT  Take 0.5 mg by nebulization every 6 (six) hours as needed for wheezing or shortness of breath (SHORTNESS OF BREATH).     loratadine 10 MG tablet  Commonly known as:  CLARITIN  Take 10 mg by mouth daily.     metFORMIN 500 MG tablet  Commonly known as:  GLUCOPHAGE  Take 500 mg by mouth 2 (two) times daily with a  meal.     olopatadine 0.1 % ophthalmic solution  Commonly known as:  PATANOL  1 drop 2 (two) times daily.     oxybutynin 10 MG 24 hr tablet  Commonly known as:  DITROPAN-XL  Take 10 mg by mouth at bedtime.     oxyCODONE-acetaminophen 10-325 MG tablet  Commonly known as:  PERCOCET  Take one tablet by mouth three times daily for pain. Do not exceed 4gm of Tylenol in 24 hours     potassium chloride SA 20 MEQ tablet  Commonly known as:  K-DUR,KLOR-CON  Take 20 mEq by mouth 2 (two) times daily.     senna 8.6 MG tablet  Commonly known as:  SENOKOT  Take 1 tablet by mouth daily.     simvastatin 10 MG tablet  Commonly known as:  ZOCOR  Take 10 mg by mouth at bedtime.     traMADol 50 MG tablet  Commonly known as:  ULTRAM  Take by mouth every 6 (six) hours as needed.     valsartan 40 MG tablet  Commonly known as:  DIOVAN  Take 1 tablet (40 mg total) by mouth daily.        Review of Systems  Constitutional: Negative for fever, chills, diaphoresis, activity change, appetite change, fatigue and unexpected weight change.       Obese  HENT: Negative for congestion, ear discharge, ear pain, hearing loss, postnasal drip, rhinorrhea, sore throat, tinnitus, trouble swallowing and voice change.   Eyes: Negative for pain, redness, itching and visual disturbance.  Respiratory: Positive for cough. Negative for choking, shortness of breath and wheezing.        Chronic bronchitis  Cardiovascular: Positive for leg swelling. Negative for chest pain and palpitations.       History of diastolic heart failure ejection fraction 60%  Gastrointestinal: Negative for nausea, abdominal pain, diarrhea, constipation and abdominal distention.       History GERD  Endocrine: Negative for cold intolerance, heat intolerance, polydipsia, polyphagia and polyuria.  Genitourinary: Negative for dysuria, urgency, frequency, hematuria, flank pain, vaginal discharge, difficulty urinating and pelvic pain.    Musculoskeletal: Positive for myalgias, arthralgias and gait problem. Negative for back pain, neck pain and neck stiffness.       History of gout.  Skin: Negative for color change, pallor and rash.  Ulceration behind the left thigh  Allergic/Immunologic: Negative.   Neurological: Negative for dizziness, tremors, seizures, syncope, weakness, numbness and headaches.       History of cerebral palsy. Diabetic neuropathy.  Hematological: Negative for adenopathy. Does not bruise/bleed easily.       Chronic anemia  Psychiatric/Behavioral: Positive for confusion. Negative for suicidal ideas, hallucinations, behavioral problems, sleep disturbance, dysphoric mood and agitation. The patient is nervous/anxious. The patient is not hyperactive.     Immunization History  Administered Date(s) Administered  . Influenza-Unspecified 08/15/2013, 08/02/2015  . Pneumococcal-Unspecified 12/18/2010   Pertinent  Health Maintenance Due  Topic Date Due  . HEMOGLOBIN A1C  July 01, 1946  . FOOT EXAM  08/11/1958  . OPHTHALMOLOGY EXAM  08/11/1958  . COLONOSCOPY  08/11/1998  . PNA vac Low Risk Adult (1 of 2 - PCV13) 08/11/2013  . INFLUENZA VACCINE  05/21/2016  . MAMMOGRAM  12/05/2017  . DEXA SCAN  Completed   No flowsheet data found. Functional Status Survey:    Filed Vitals:   01/22/16 0959  BP: 115/75  Pulse: 89  Temp: 98.1 F (36.7 C)  Resp: 17  Height: 5\' 6"  (1.676 m)  Weight: 210 lb (95.255 kg)  SpO2: 95%   Body mass index is 33.91 kg/(m^2). Physical Exam  Constitutional: She appears well-developed and well-nourished. No distress.  obesse  HENT:  Right Ear: External ear normal.  Left Ear: External ear normal.  Nose: Nose normal.  Mouth/Throat: Oropharynx is clear and moist. No oropharyngeal exudate.  Hearing loss bilaterally  Eyes: Conjunctivae and EOM are normal. Pupils are equal, round, and reactive to light. No scleral icterus.  Neck: No JVD present. No tracheal deviation present.  No thyromegaly present.  Cardiovascular: Normal rate, regular rhythm, normal heart sounds and intact distal pulses.  Exam reveals no gallop and no friction rub.   No murmur heard. Pulmonary/Chest: Effort normal. No respiratory distress. She has no wheezes. She has rales. She exhibits no tenderness.  Abdominal: She exhibits no distension and no mass. There is no tenderness.  Musculoskeletal: Normal range of motion. She exhibits edema (2+ both lower legs). She exhibits no tenderness.  Nonsmoker. Wheelchair and bedbound. Using motorized wheelchair. Right leg is shorter than the left and she uses a shoe with left.  Lymphadenopathy:    She has no cervical adenopathy.  Neurological: She is alert. No cranial nerve deficit. Coordination normal.  Skin: No rash noted. She is not diaphoretic. No erythema. No pallor.  History of pressure ulcers of both buttocks. Ulceration on the left thigh  Psychiatric: She has a normal mood and affect. Her behavior is normal. Judgment and thought content normal.    Labs reviewed: No results for input(s): NA, K, CL, CO2, GLUCOSE, BUN, CREATININE, CALCIUM, MG, PHOS in the last 8760 hours. No results for input(s): AST, ALT, ALKPHOS, BILITOT, PROT, ALBUMIN in the last 8760 hours. No results for input(s): WBC, NEUTROABS, HGB, HCT, MCV, PLT in the last 8760 hours. Lab Results  Component Value Date   TSH 1.201 *Test methodology is 3rd generation TSH* 09/23/2007    Assessment/Plan 1. Ulcer of thigh, left, limited to breakdown of skin ([place]) Wound care nurse will continue to see. Foam dressing applied every 2 days following cleansing.  2. Cough Persistent added Mucinex DM 1 twice a day.  3. Chronic diastolic CHF (congestive heart failure) ([place]) Observe. Consider starting Entresto and repeat 2-D echocardiogram in the future.  4. CKD (chronic kidney disease), stage 2 (mild) -CMP  5. Essential hypertension CMP  6. DM type 2 with diabetic peripheral neuropathy  (H[facility]) CMP, A1c, find results of urine microalbumin ordered 3-17  7. Hypercholesterolemia Lipid panel

## 2016-02-09 ENCOUNTER — Other Ambulatory Visit: Payer: Self-pay

## 2016-02-09 MED ORDER — OXYCODONE-ACETAMINOPHEN 10-325 MG PO TABS: ORAL_TABLET | ORAL | Status: DC

## 2016-02-09 NOTE — Telephone Encounter (Signed)
[facility] requested refill

## 2016-03-12 ENCOUNTER — Other Ambulatory Visit: Payer: Self-pay

## 2016-03-12 MED ORDER — OXYCODONE-ACETAMINOPHEN 10-325 MG PO TABS: ORAL_TABLET | ORAL | Status: DC

## 2016-03-12 MED ORDER — OXYCODONE-ACETAMINOPHEN 10-325 MG PO TABS: ORAL_TABLET | ORAL | Status: AC

## 2016-03-12 NOTE — Telephone Encounter (Signed)
Rx faxed to [facility] @ [phone], phone number [phone]  

## 2016-03-12 NOTE — Telephone Encounter (Signed)
Prescription request was received from:  [facility] [address] [place] [address]  Phone: [phone]  Fax: [phone]  

## 2017-05-02 ENCOUNTER — Other Ambulatory Visit: Payer: Self-pay | Admitting: Internal Medicine

## 2017-05-02 ENCOUNTER — Other Ambulatory Visit: Payer: Self-pay | Admitting: Nurse Practitioner

## 2017-05-02 DIAGNOSIS — N6459 Other signs and symptoms in breast: Secondary | ICD-10-CM

## 2017-05-02 DIAGNOSIS — N6320 Unspecified lump in the left breast, unspecified quadrant: Secondary | ICD-10-CM

## 2017-05-07 ENCOUNTER — Ambulatory Visit
Admission: RE | Admit: 2017-05-07 | Discharge: 2017-05-07 | Disposition: A | Discharge status: Home / Self Care | Payer: Medicare Other | Source: Ambulatory Visit | Attending: Internal Medicine | Admitting: Internal Medicine

## 2017-05-07 ENCOUNTER — Other Ambulatory Visit: Payer: Self-pay | Admitting: Internal Medicine

## 2017-05-07 DIAGNOSIS — R599 Enlarged lymph nodes, unspecified: Secondary | ICD-10-CM

## 2017-05-07 DIAGNOSIS — N6459 Other signs and symptoms in breast: Secondary | ICD-10-CM

## 2017-05-07 DIAGNOSIS — N6320 Unspecified lump in the left breast, unspecified quadrant: Secondary | ICD-10-CM

## 2017-05-08 ENCOUNTER — Ambulatory Visit
Admission: RE | Admit: 2017-05-08 | Discharge: 2017-05-08 | Disposition: A | Discharge status: Home / Self Care | Payer: Medicare Other | Source: Ambulatory Visit | Attending: Internal Medicine | Admitting: Internal Medicine

## 2017-05-08 ENCOUNTER — Other Ambulatory Visit: Payer: Self-pay | Admitting: Internal Medicine

## 2017-05-08 DIAGNOSIS — N6320 Unspecified lump in the left breast, unspecified quadrant: Secondary | ICD-10-CM

## 2017-05-08 DIAGNOSIS — R599 Enlarged lymph nodes, unspecified: Secondary | ICD-10-CM

## 2017-05-08 DIAGNOSIS — N6459 Other signs and symptoms in breast: Secondary | ICD-10-CM

## 2017-05-16 ENCOUNTER — Ambulatory Visit: Payer: Self-pay | Admitting: Surgery

## 2017-05-16 NOTE — H&P (Signed)
History of Present Illness [name] [name]. [name] Thum MD; 05/16/2017 7:04 PM) The patient is a 71 year old female who presents with breast cancer. Referred by [name] [name] for left breast cancer PCP - [name] Infante, FNP at [facility]  This is a 71 yo female with cerebral palsy and cognitive deficits who is a nursing home resident. She is wheelchair bound due to osteoarthritis and CHF. She is diabetic. She had a mammogram in 2/17 which was unremarkable. This summer, she was noted to have left nipple retraction and developed a large central breast mass with peau d'orange appearance of the skin. She underwent evaluation with mammogram, ultrasound, and biopsy. 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 She is referred for surgical evaluation. We discussed her case in Breast Cancer Conference earlier in the week and determined that she should consider neoadjuvant chemotherapy. She is accompanied by a medical assistant from her facility.  CLINICAL DATA: Left nipple inversion, left breast mass.  EXAM: 2D DIGITAL DIAGNOSTIC BILATERAL MAMMOGRAM WITH CAD AND ADJUNCT TOMO  ULTRASOUND BILATERAL BREAST  COMPARISON: Previous exam(s).  ACR Breast Density Category c: The breast tissue is heterogeneously dense, which may obscure small masses.  FINDINGS: Left breast: There is an irregular mass within the left breast, involving the subareolar and outer left breast, at anterior depth, measuring approximately 9 cm greatest dimension, with associated nipple retraction. There is diffuse trabecular thickening within the outer and central left breast, compatible with edema, and there is overlying skin thickening suggesting.  Right breast: There are stable benign calcifications within the right breast. There are no new dominant masses, suspicious calcifications or secondary signs of malignancy within the right breast.  There is chronic right nipple inversion which is not significantly changed compared to previous exams.  Mammographic images were processed with CAD.  Left breast: Targeted ultrasound is performed, showing an irregular hypoechoic mass within the subareolar left breast, extending towards the 3 o'clock axis, measuring more than 6 cm greatest dimension (although the measurement of 9 cm greatest dimension is more convincing on the mammogram).  Right breast: Targeted ultrasound is performed of the retroareolar right breast, showing only normal fibroglandular tissues and fat lobules. No suspicious solid or cystic mass is identified. No dilated milk ducts.  Left axilla was also evaluated with ultrasound showing at least 3 enlarged and morphologically abnormal lymph nodes, largest demonstrating a cortical thickness of 9 mm.  IMPRESSION: 1. Irregular mass within the left breast, involving the subareolar and outer left breast, measuring approximately 9 cm greatest dimension, with associated nipple retraction. This is a highly suspicious finding for which ultrasound-guided biopsy is recommended. Given the diffuse trabecular thickening within the left breast and overlying skin thickening, an inflammatory breast cancer is suspected. 2. At least 3 enlarged and morphologically abnormal lymph nodes in the left axilla. Ultrasound-guided biopsy of the dominant lymph node, with cortex thickness of 9 mm, is recommended. 3. No evidence of malignancy within the right breast.  RECOMMENDATION: 1. Ultrasound-guided biopsy of the left breast mass. 2. Ultrasound-guided biopsy of the largest lymph node in the left axilla.  Ultrasound-guided biopsy is scheduled for July 19th.  I have discussed the findings and recommendations with the patient. Results were also provided in writing at the conclusion of the visit. If applicable, a reminder letter will be sent to the patient regarding the next  appointment.  BI-RADS CATEGORY 5: Highly suggestive of malignancy.  Electronically Signed By: Bary Richard M.D. On: 05/07/2017 15:22  CLINICAL DATA: Patient presents for ultrasound-guided core biopsy of mass in the left breast.  EXAM: ULTRASOUND GUIDED LEFT BREAST CORE NEEDLE BIOPSY  COMPARISON: Previous exam(s).  FINDINGS: I met with the patient and we discussed the procedure of ultrasound-guided biopsy, including benefits and alternatives. We discussed the high likelihood of a successful procedure. We discussed the risks of the procedure, including infection, bleeding, tissue injury, clip migration, and inadequate sampling. Informed written consent was given. The usual time-out protocol was performed immediately prior to the procedure.  Lesion quadrant: Lower outer quadrant left breast  Using sterile technique and 1% Lidocaine as local anesthetic, under direct ultrasound visualization, a 12 gauge spring-loaded device was used to perform biopsy of mass in the 3:30 o'clock location of the left breast using a is lateral approach. At the conclusion of the procedure a ribbon shaped tissue marker clip was deployed into the biopsy cavity. Follow up 2 view mammogram was performed and dictated separately.  IMPRESSION: Ultrasound guided biopsy of left breast mass. No apparent complications.  Electronically Signed: By: Norva Pavlov M.D. On: 05/08/2017 14:48  CLINICAL DATA: Patient presents for ultrasound-guided core biopsy of enlarged left axillary lymph node.  EXAM: ULTRASOUND GUIDED CORE NEEDLE BIOPSY OF A LEFT AXILLARY NODE  COMPARISON: Previous exam(s).  FINDINGS: I met with the patient and we discussed the procedure of ultrasound-guided biopsy, including benefits and alternatives. We discussed the high likelihood of a successful procedure. We discussed the risks of the procedure, including infection, bleeding, tissue injury, clip migration, and  inadequate sampling. Informed written consent was given. The usual time-out protocol was performed immediately prior to the procedure.  Using sterile technique and 1% Lidocaine as local anesthetic, under direct ultrasound visualization, a 14 gauge spring-loaded device was used to perform biopsy of enlarged lower left axillary lymph node using a lateral approach. At the conclusion of the procedure a spiral shaped HydroMARK tissue marker clip was deployed into the biopsy cavity. Follow up 2 view mammogram was performed and dictated separately.  IMPRESSION: Ultrasound guided biopsy of enlarged left axillary lymph node. No apparent complications.  Electronically Signed: By: Norva Pavlov M.D. On: 05/08/2017 15:03  CLINICAL DATA: Status post ultrasound-guided core biopsy of mass in the 3:30 o'clock location of the left breast and an enlarged left axillary lymph node.  EXAM: DIAGNOSTIC LEFT MAMMOGRAM POST ULTRASOUND BIOPSY x2  COMPARISON: Previous exam(s).  FINDINGS: Mammographic images were obtained following ultrasound guided biopsy of mass in the 3:30 o'clock location of the left breast and placement of a ribbon shaped clip. Ribbon shaped clip is identified in the lateral portion of the left breast as expected.  Following ultrasound-guided core biopsy of an lower left axillary lymph node, spiral shaped clip was placed and is demonstrated only on the oblique image.  IMPRESSION: Tissue marker clips are in the expected locations after biopsy.  Final Assessment: Post Procedure Mammograms for Marker Placement   Electronically Signed By: Norva Pavlov M.D. On: 05/08/2017 15:05  Diagnosis 1. 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 2. Lymph node, needle/core biopsy, left axilla - MAMMARY CARCINOMA INVOLVING FIBROADIPOSE TISSUE. Microscopic Comment 1. & 2. E- Cadherin and breast prognostic profile will be  performed. Dr. Charm Barges agrees. Called to [facility] on 05/09/17. (JDP:ah 05/09/17) ADDENDUM: Immunohistochemistry for E-Cadherin shows the tumor is negative consistent with lobular carcinoma. (JDP:gt, 05/09/17) Jimmy Picket MD Pathologist, Electronic Signature (Case signed 05/09/2017)  1.  PROGNOSTIC INDICATORS Results: IMMUNOHISTOCHEMICAL AND MORPHOMETRIC ANALYSIS PERFORMED MANUALLY Estrogen Receptor: 95%, POSITIVE, STRONG STAINING INTENSITY Progesterone Receptor: 95%, POSITIVE, STRONG STAINING INTENSITY Proliferation Marker Ki67: 25% REFERENCE RANGE ESTROGEN RECEPTOR NEGATIVE 0% POSITIVE =>1% REFERENCE RANGE PROGESTERONE RECEPTOR NEGATIVE 0% POSITIVE =>1% All controls stained appropriately Vicente Males MD Pathologist, Electronic Signature ( Signed 05/13/2017)   Diagnostic Studies History Dalbert Mayotte, CMA; 05/16/2017 11:22 AM) Colonoscopy never  Allergies Dalbert Mayotte, CMA; 05/16/2017 11:23 AM) Miconazole *Antifungals**  Medication History Dalbert Mayotte, CMA; 05/16/2017 11:28 AM) Allopurinol ('300MG'$  Tablet, Oral) Active. Escitalopram Oxalate (Oral) Specific strength unknown - Active. Loratadine ('10MG'$  Tablet, Oral) Active. Patanol (0.1% Solution, Ophthalmic) Active. Valsartan ('40MG'$  Tablet, Oral) Active. Ferrous Sulfate (325 (65 Fe)MG Tablet, Oral) Active. Furosemide ('80MG'$  Tablet, Oral) Active.  Social History Dalbert Mayotte, [place]; 05/16/2017 11:22 AM) Caffeine use Coffee. No alcohol use No drug use Tobacco use Never smoker.  Family History Dalbert Mayotte, [place]; 05/16/2017 11:22 AM) Family history unknown First Degree Relatives  Other Problems Dalbert Mayotte, CMA; 05/16/2017 11:22 AM) Anxiety Disorder Arthritis Back Pain Breast Cancer Gastroesophageal Reflux Disease High blood pressure Hypercholesterolemia     Review of Systems Dalbert Mayotte CMA; 05/16/2017 11:22 AM) General Not Present- Appetite Loss, Chills,  Fatigue, Fever, Night Sweats, Weight Gain and Weight Loss. Skin Not Present- Change in Wart/Mole, Dryness, Hives, Jaundice, New Lesions, Non-Healing Wounds, Rash and Ulcer. HEENT Present- Hearing Loss. Not Present- Earache, Hoarseness, Nose Bleed, Oral Ulcers, Ringing in the Ears, Seasonal Allergies, Sinus Pain, Sore Throat, Visual Disturbances, Wears glasses/contact lenses and Yellow Eyes. Respiratory Present- Chronic Cough. Not Present- Bloody sputum, Difficulty Breathing, Snoring and Wheezing. Breast Present- Breast Mass and Skin Changes. Not Present- Breast Pain and Nipple Discharge. Cardiovascular Not Present- Chest Pain, Difficulty Breathing Lying Down, Leg Cramps, Palpitations, Rapid Heart Rate, Shortness of Breath and Swelling of Extremities. Gastrointestinal Present- Difficulty Swallowing. Not Present- Abdominal Pain, Bloating, Bloody Stool, Change in Bowel Habits, Chronic diarrhea, Constipation, Excessive gas, Gets full quickly at meals, Hemorrhoids, Indigestion, Nausea, Rectal Pain and Vomiting. Female Genitourinary Not Present- Frequency, Nocturia, Painful Urination, Pelvic Pain and Urgency. Musculoskeletal Not Present- Back Pain, Joint Pain, Joint Stiffness, Muscle Pain, Muscle Weakness and Swelling of Extremities. Neurological Present- Weakness. Not Present- Decreased Memory, Fainting, Headaches, Numbness, Seizures, Tingling, Tremor and Trouble walking. Psychiatric Present- Anxiety. Not Present- Bipolar, Change in Sleep Pattern, Depression, Fearful and Frequent crying. Endocrine Not Present- Cold Intolerance, Excessive Hunger, Hair Changes, Heat Intolerance, Hot flashes and New Diabetes.  Vitals Dalbert Mayotte CMA; 05/16/2017 11:24 AM) 05/16/2017 11:23 AM Weight: 208 lb Temp.: 98.42F  Pulse: 76 (Regular)  BP: 122/84 (Sitting, Left Arm, Standard)      Physical Exam Rodman Key K. Amma Crear MD; 05/16/2017 7:06 PM)  The physical exam findings are as follows: Note:Elderly female -  wheelchair bound Eyes: Pupils equal, round; sclera anicteric HENT: Oral mucosa moist; good dentition; very hard of hearing Neck: No masses palpated, no thyromegaly Lungs: CTA bilaterally; normal respiratory effort CV: Regular rate and rhythm; no murmurs; extremities well-perfused with no edema Breasts: right breast - soft, no palpable masses; no axillary lymphadenopathy left breast - inverted nipple; no nipple discharge; peau d'orange of central and lateral breast; 10 cm area of firmness in central and lateral breast No palpable axillary lymphadenopathy - patient is very obese Abd: +bowel sounds, soft, non-tender, no palpable organomegaly; no palpable hernias Skin: Warm, dry; no sign of jaundice Psychiatric - alert and oriented x 4; calm mood and affect    Assessment & Plan Rodman Key K. 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 The surgical procedure has been discussed with the patient. Potential risks, benefits, alternative treatments, and expected outcomes have been explained. All of the patient's questions at this time have been answered. The likelihood of reaching the patient's treatment goal is good. The patient understand the proposed surgical procedure and wishes to proceed. Note:Will refer patient to Medical Oncology to discuss neoadjuvant therapy. If we were to proceed with surgery at this time, she would need mastectomy and likely lymph node dissection. Hopefully, if she responds to chemotherapy, we can shrink the tumor and nodes and perform a lumpectomy/ targeted lymph node dissection. We will plan to place a port to facilitate chemotherapy.  [name] [name]. Georgette Dover, MD, [facility] Surgery  General/ Trauma Surgery  05/16/2017 7:08 PM

## 2017-05-20 ENCOUNTER — Telehealth: Payer: Self-pay | Admitting: Cardiovascular Disease

## 2017-05-20 NOTE — Telephone Encounter (Signed)
No fax received.  However she hasn't been seen since 2016 and would need an appointment for clearance.  Can be with APP.

## 2017-05-20 NOTE — Telephone Encounter (Signed)
Appointment scheduled for 05/22/17 and information given to Nursing home patient resides

## 2017-05-20 NOTE — Telephone Encounter (Signed)
[name] [name] checking on the status of the fax she sent this morning. She would like this back asap please,so pt can be scheduled for a port for breat cancer.

## 2017-05-21 NOTE — Progress Notes (Deleted)
Cardiology Office Note   Date:  05/21/2017   ID:  [name] [name], DOB 1946-06-03, MRN [id number]  PCP:  Patient, No Pcp Per  Cardiologist:   Skeet Latch, MD   No chief complaint on file.    History of Present Illness: [name] [name] is a 71 y.o. female with hypertension, PAD, hyperlipidemia, chronic diastolic heart failure, recently diagnosed breast cancer,and Cerebral palsy, who presents for follow-up. She was initially seen 07/2015 for an evaluation of shortness of breath.  Prior to that she had an echo on 05/2014 that revealed LVEF 60% with mild LVH.at the time of her initial appointment she was referred for a BNP that was within normal limits (24.9).     Past Medical History:  Diagnosis Date  . Bronchitis   . Cerebral palsy ([place])   . Chronic diastolic CHF (congestive heart failure) ([place]) 05/27/2014  . CKD (chronic kidney disease)   . Cough 05/27/2014   ?  ACE cough August, 2015?when d/c'd   . DM type 2 with diabetic peripheral neuropathy ([place])   . Ejection fraction   . GERD (gastroesophageal reflux disease)   . Glaucoma   . Gout   . Hypercholesterolemia   . Hypertensive heart disease   . Neuropathy   . Osteoarthritis   . PAD (peripheral artery disease) ([facility])     No past surgical history on file.   Current Outpatient Prescriptions  Medication Sig Dispense Refill  . acetaminophen (TYLENOL) 325 MG tablet Take 325 mg by mouth. Take one tablet every 6 hours as needed for pain do not exceed 4 gms in 24 hours    . ADVAIR DISKUS 250-50 MCG/DOSE AEPB Inhale 1 puff into the lungs 2 (two) times daily.  0  . allopurinol (ZYLOPRIM) 300 MG tablet Take 300 mg by mouth daily.    . calcium-vitamin D (OSCAL WITH D) 500-200 MG-UNIT per tablet Take 1 tablet by mouth 2 (two) times daily.    . Cholecalciferol 50000 units TABS Take by mouth. Take one tablet every month on the 17th for osteoporosis    . cyclobenzaprine (FLEXERIL) 10 MG tablet Take 10 mg by mouth 3 (three) times daily.       Marland Kitchen escitalopram (LEXAPRO) 5 MG tablet Take 5 mg by mouth daily.    . famotidine (PEPCID) 20 MG tablet Take 20 mg by mouth 2 (two) times daily.    . ferrous sulfate 325 (65 FE) MG tablet Take 325 mg by mouth daily with breakfast.    . furosemide (LASIX) 40 MG tablet Take 1.5 tablets (60 mg total) by mouth daily. 45 tablet 6  . gabapentin (NEURONTIN) 100 MG capsule Take 100 mg by mouth 3 (three) times daily.    Marland Kitchen guaiFENesin (MUCINEX) 600 MG 12 hr tablet Take 600 mg by mouth 2 (two) times daily.    Marland Kitchen guaifenesin (SILTUSSIN SA) 100 MG/5ML syrup Take by mouth. 15 ml by mouth every 4 hours as needed for cough    . ipratropium (ATROVENT) 0.02 % nebulizer solution Take 0.5 mg by nebulization every 6 (six) hours as needed for wheezing or shortness of breath (SHORTNESS OF BREATH).     Marland Kitchen loratadine (CLARITIN) 10 MG tablet Take 10 mg by mouth daily.    . metFORMIN (GLUCOPHAGE) 500 MG tablet Take 500 mg by mouth 2 (two) times daily with a meal.    . olopatadine (PATANOL) 0.1 % ophthalmic solution 1 drop 2 (two) times daily.    Marland Kitchen oxybutynin (DITROPAN-XL) 10  MG 24 hr tablet Take 10 mg by mouth at bedtime.    Marland Kitchen oxyCODONE-acetaminophen (PERCOCET) 10-325 MG tablet Take one tablet by mouth three times daily for pain. Do not exceed 4gm of Tylenol in 24 hours 90 tablet 0  . potassium chloride SA (K-DUR,KLOR-CON) 20 MEQ tablet Take 20 mEq by mouth 2 (two) times daily.    Marland Kitchen senna (SENOKOT) 8.6 MG tablet Take 1 tablet by mouth daily.    . simvastatin (ZOCOR) 10 MG tablet Take 10 mg by mouth at bedtime.    . traMADol (ULTRAM) 50 MG tablet Take by mouth every 6 (six) hours as needed.    . valsartan (DIOVAN) 40 MG tablet Take 1 tablet (40 mg total) by mouth daily. 30 tablet 6   No current facility-administered medications for this visit.     Allergies:   Miconazole    Social History:  The patient  reports that she quit smoking about 13 years ago. Her smoking use included Cigarettes. She has a 80.00 pack-year smoking  history. She has never used smokeless tobacco. She reports that she does not drink alcohol or use drugs.   Family History:  The patient's family history includes Heart disease in her father and mother.    ROS:  Please see the history of present illness.   Otherwise, review of systems are positive for none.   All other systems are reviewed and negative.    PHYSICAL EXAM: VS:  There were no vitals taken for this visit. , BMI There is no height or weight on file to calculate BMI. GENERAL:  Well appearing HEENT:  Pupils equal round and reactive, fundi not visualized, oral mucosa unremarkable NECK:  No jugular venous distention, waveform within normal limits, carotid upstroke brisk and symmetric, no bruits, no thyromegaly LYMPHATICS:  No cervical adenopathy LUNGS:  Clear to auscultation bilaterally HEART:  Distant heart sounds.  RRR.  PMI not displaced or sustained,S1 and S2 within normal limits, no S3, no S4, no clicks, no rubs, no murmurs ABD:  Flat, positive bowel sounds normal in frequency in pitch, no bruits, no rebound, no guarding, no midline pulsatile mass, no hepatomegaly, no splenomegaly EXT:  2 plus pulses throughout, no edema, no cyanosis no clubbing SKIN:  No rashes no nodules NEURO:  Cranial nerves II through XII grossly intact, motor grossly intact throughout PSYCH:  Cognitively intact, oriented to person place and time    EKG:  EKG is ordered today. The ekg ordered today demonstrates sinus rhythm at 78 bpm.  Low voltage.  Echo 05/27/14: Study Conclusions  - Left ventricle: Technically difficult study. The cavity size was normal. Wall thickness was increased in a pattern of mild LVH. The estimated ejection fraction was 60%. Regional wall motion abnormalities cannot be excluded. - Left atrium: The atrium was mildly dilated. - Right ventricle: The cavity size was mildly dilated. Systolic function was normal.  Recent Labs: No results found for requested labs within  last 8760 hours.   08/18/15: Sodium 139, potassium 4.3, BUN 21, creatinine 0.67 Total cholesterol 111, Triglycerides 174, HDL 26, LDL 50  Lipid Panel No results found for: CHOL, TRIG, HDL, CHOLHDL, VLDL, LDLCALC, LDLDIRECT    Wt Readings from Last 3 Encounters:  01/22/16 95.3 kg (210 lb)      ASSESSMENT AND PLAN:  # Chronic diastolic heart failure: Ms. Loveall has chronic diastolic heart failure and has some lower extremity edema. She does not appear to have much intravascular volume overload. Her neck veins are not  elevated. She does have some wheezing on exam is benign crackles. I suspect that much of her edema is due to the fact that her legs are dependent most of the day. We will check a basic metabolic panel and a BNP. If her BNP is elevated and her renal function and electron stable we will increase her furosemide. Will not make any changes at this time until we have that additional data. She will continue on valsartan.  Continue furosemide.  # Hypertension: Blood pressure is well controlled today. Continue losartan and furosemide.  # Hyperlipidemia: We will check her lipid panel today. Continue simvastatin.   Current medicines are reviewed at length with the patient today.  The patient does not have concerns regarding medicines.  The following changes have been made:  no change  Labs/ tests ordered today include:  No orders of the defined types were placed in this encounter.    Disposition:   FU with Obrien Huskins C. Oval Linsey, MD in 6 months     Signed, Skeet Latch, MD  05/21/2017 2:03 PM    [facility]

## 2017-05-21 NOTE — Telephone Encounter (Signed)
Left message for [name] [name] - Inpatient patient has appointment tomorrow and to please refax clearance request.

## 2017-05-22 ENCOUNTER — Ambulatory Visit: Payer: Medicare Other | Admitting: Cardiovascular Disease

## 2017-05-23 ENCOUNTER — Ambulatory Visit: Payer: Medicare Other | Admitting: Cardiovascular Disease

## 2017-05-23 NOTE — Telephone Encounter (Signed)
Patient was scheduled to be seen yesterday and rescheduled secondary to arriving so late and Dr Oval Linsey had to read Echo and Nuclears, only in office for few patients in am Today appointment was rescheduled after her arrival time.  Did call and leave Claiborne Billings a message so she would be aware

## 2017-05-30 ENCOUNTER — Encounter: Payer: Self-pay | Admitting: Hematology and Oncology

## 2017-06-03 ENCOUNTER — Ambulatory Visit (INDEPENDENT_AMBULATORY_CARE_PROVIDER_SITE_OTHER): Payer: Medicare Other | Admitting: Physician Assistant

## 2017-06-03 ENCOUNTER — Encounter: Payer: Self-pay | Admitting: Physician Assistant

## 2017-06-03 VITALS — BP 134/88 | HR 83

## 2017-06-03 DIAGNOSIS — I5032 Chronic diastolic (congestive) heart failure: Secondary | ICD-10-CM | POA: Diagnosis not present

## 2017-06-03 DIAGNOSIS — Z01810 Encounter for preprocedural cardiovascular examination: Secondary | ICD-10-CM | POA: Diagnosis not present

## 2017-06-03 DIAGNOSIS — G809 Cerebral palsy, unspecified: Secondary | ICD-10-CM

## 2017-06-03 DIAGNOSIS — E785 Hyperlipidemia, unspecified: Secondary | ICD-10-CM

## 2017-06-03 DIAGNOSIS — I10 Essential (primary) hypertension: Secondary | ICD-10-CM

## 2017-06-03 DIAGNOSIS — R600 Localized edema: Secondary | ICD-10-CM | POA: Diagnosis not present

## 2017-06-03 DIAGNOSIS — C50912 Malignant neoplasm of unspecified site of left female breast: Secondary | ICD-10-CM

## 2017-06-03 DIAGNOSIS — I739 Peripheral vascular disease, unspecified: Secondary | ICD-10-CM

## 2017-06-03 NOTE — Progress Notes (Signed)
Cardiology Office Note    Date:  06/05/2017   ID:  HARBOR PASTER, DOB [birth year], MRN [id number]  PCP:  Patient, No Pcp Per  Cardiologist:  Dr. Oval Linsey  Chief Complaint  Patient presents with  . Follow-up    seen for Dr. Oval Linsey  . Pre-op Exam    requested by Dr. Georgette Dover for left breast cancer    History of Present Illness:  [name] [name] is a 71 y.o. female with PMH of HTN, HLD, chronic diastolic heart failure, PAD, and cerebral palsy. She was last seen on 08/16/2015 for shortness of breath and lower extremity edema. Dr. Oval Linsey felt her lower extremity edema is due to the fact that her legs are dependent most of the day. She does not have significant intravascular volume overload. Echocardiogram obtained on 05/27/2014 showed EF 60%, mild left atrial dilatation, normal LV function, mild LVH.  She presents to cardiology office visit today from [facility], she denies any of his chest discomfort or shortness of breath. She continued to have at least 2+ pitting edema near the ankle area. This apparently is chronic for her. She has cerebral palsy and rely on wheelchair to get around. More recently, she was diagnosed with left breast cancer, she is planning to undergo lobectomy and lymph node biopsy by Dr. Georgette Dover. Given poor functional status, she would not be a very good candidate for invasive study. I have discussed the case with Dr. Claiborne Billings, I would not recommend any additional workup prior to her surgery. I would recommend a repeat echocardiogram after her surgery.   Past Medical History:  Diagnosis Date  . Bronchitis   . Cerebral palsy ([place])   . Chronic diastolic CHF (congestive heart failure) ([place]) 05/27/2014  . CKD (chronic kidney disease)   . Cough 05/27/2014   ?  ACE cough August, 2015?when d/c'd   . DM type 2 with diabetic peripheral neuropathy ([place])   . Ejection fraction   . GERD (gastroesophageal reflux disease)   . Glaucoma   . Gout   . Hypercholesterolemia   . Hypertensive  heart disease   . Neuropathy   . Osteoarthritis   . PAD (peripheral artery disease) ([facility])     No past surgical history on file.  Current Medications: Outpatient Medications Prior to Visit  Medication Sig Dispense Refill  . acetaminophen (TYLENOL) 325 MG tablet Take 325 mg by mouth. Take one tablet every 6 hours as needed for pain do not exceed 4 gms in 24 hours    . ADVAIR DISKUS 250-50 MCG/DOSE AEPB Inhale 1 puff into the lungs 2 (two) times daily.  0  . allopurinol (ZYLOPRIM) 300 MG tablet Take 300 mg by mouth daily.    . calcium-vitamin D (OSCAL WITH D) 500-200 MG-UNIT per tablet Take 1 tablet by mouth 2 (two) times daily.    . Cholecalciferol 50000 units TABS Take by mouth. Take one tablet every month on the 17th for osteoporosis    . cyclobenzaprine (FLEXERIL) 10 MG tablet Take 10 mg by mouth 3 (three) times daily.     Marland Kitchen escitalopram (LEXAPRO) 5 MG tablet Take 5 mg by mouth daily.    . famotidine (PEPCID) 20 MG tablet Take 20 mg by mouth 2 (two) times daily.    . ferrous sulfate 325 (65 FE) MG tablet Take 325 mg by mouth daily with breakfast.    . furosemide (LASIX) 40 MG tablet Take 1.5 tablets (60 mg total) by mouth daily. 45 tablet 6  .  gabapentin (NEURONTIN) 100 MG capsule Take 100 mg by mouth 3 (three) times daily.    Marland Kitchen guaiFENesin (MUCINEX) 600 MG 12 hr tablet Take 600 mg by mouth 2 (two) times daily.    Marland Kitchen guaifenesin (SILTUSSIN SA) 100 MG/5ML syrup Take by mouth. 15 ml by mouth every 4 hours as needed for cough    . ipratropium (ATROVENT) 0.02 % nebulizer solution Take 0.5 mg by nebulization every 6 (six) hours as needed for wheezing or shortness of breath (SHORTNESS OF BREATH).     Marland Kitchen loratadine (CLARITIN) 10 MG tablet Take 10 mg by mouth daily.    . metFORMIN (GLUCOPHAGE) 500 MG tablet Take 500 mg by mouth 2 (two) times daily with a meal.    . olopatadine (PATANOL) 0.1 % ophthalmic solution 1 drop 2 (two) times daily.    Marland Kitchen oxybutynin (DITROPAN-XL) 10 MG 24 hr tablet Take 10  mg by mouth at bedtime.    Marland Kitchen oxyCODONE-acetaminophen (PERCOCET) 10-325 MG tablet Take one tablet by mouth three times daily for pain. Do not exceed 4gm of Tylenol in 24 hours 90 tablet 0  . potassium chloride SA (K-DUR,KLOR-CON) 20 MEQ tablet Take 20 mEq by mouth 2 (two) times daily.    Marland Kitchen senna (SENOKOT) 8.6 MG tablet Take 1 tablet by mouth daily.    . simvastatin (ZOCOR) 10 MG tablet Take 10 mg by mouth at bedtime.    . traMADol (ULTRAM) 50 MG tablet Take by mouth every 6 (six) hours as needed.    . valsartan (DIOVAN) 40 MG tablet Take 1 tablet (40 mg total) by mouth daily. 30 tablet 6   No facility-administered medications prior to visit.      Allergies:   Miconazole   Social History   Social History  . Marital status: Single    Spouse name: N/A  . Number of children: N/A  . Years of education: N/A   Social History Main Topics  . Smoking status: Former Smoker    Packs/day: 2.00    Years: 40.00    Types: Cigarettes    Quit date: 10/22/2003  . Smokeless tobacco: Never Used  . Alcohol use No  . Drug use: No  . Sexual activity: Not Asked   Other Topics Concern  . None   Social History Narrative  . None     Family History:  The patient's family history includes Heart disease in her father and mother.   ROS:   Please see the history of present illness.    ROS All other systems reviewed and are negative.   PHYSICAL EXAM:   VS:  BP 134/88   Pulse 83    GEN: In wheelchair  HEENT: normal  Neck: no JVD, carotid bruits, or masses Cardiac: RRR; no murmurs, rubs, or gallops. 2-3+ pitting edema in bilateral LE Respiratory:  clear to auscultation bilaterally, normal work of breathing GI: soft, nontender, nondistended, + BS MS: no deformity or atrophy  Skin: warm and dry, no rash Neuro:  Alert and Oriented x 3, Strength and sensation are intact Psych: euthymic mood, full affect  Wt Readings from Last 3 Encounters:  01/22/16 210 lb (95.3 kg)      Studies/Labs Reviewed:     EKG:  EKG is ordered today.  The ekg ordered today demonstrates Normal sinus rhythm with low voltage.  Recent Labs: No results found for requested labs within last 8760 hours.   Lipid Panel No results found for: CHOL, TRIG, HDL, CHOLHDL, VLDL, LDLCALC, LDLDIRECT  Additional studies/ records that were reviewed today include:   Echo 05/27/2014 LV EF: 60%  Study Conclusions  - Left ventricle: Technically difficult study. The cavity size was normal. Wall thickness was increased in a pattern of mild LVH. The estimated ejection fraction was 60%. Regional wall motion abnormalities cannot be excluded. - Left atrium: The atrium was mildly dilated. - Right ventricle: The cavity size was mildly dilated. Systolic function was normal.   ASSESSMENT:    1. Preop cardiovascular exam   2. Essential hypertension   3. Chronic diastolic CHF (congestive heart failure) ([place])   4. Lower extremity edema   5. Hyperlipidemia, unspecified hyperlipidemia type   6. PAD (peripheral artery disease) ([place])   7. Cerebral palsy, unspecified type ([place])   8. Malignant neoplasm of left female breast, unspecified estrogen receptor status, unspecified site of breast ([place])      PLAN:  In order of problems listed above:  1. Preoperative clearance prior to surgery for left breast cancer: She denies any chest discomfort or shortness breath. She is wheelchair bound. She [name] be a very poor candidate for invasive study. No additional workup is needed prior to surgery. The case has been discussed with DOD Dr. Claiborne Billings who also agrees.  2. Chronic diastolic heart failure: Previous echocardiogram showed EF 60% in 2015, [name] repeat echocardiogram, note, she does not need to wait for echocardiogram before her surgery. She is cleared for surgery, and we plan to obtain the echo after the surgery.  3. Bilateral lower extremity edema: Chronic, related to the dependent position she is in most of the  day.  4. Hypertension: Blood pressure stable  5. Hyperlipidemia: Continue Zocor    Medication Adjustments/Labs and Tests Ordered: Current medicines are reviewed at length with the patient today.  Concerns regarding medicines are outlined above.  Medication changes, Labs and Tests ordered today are listed in the Patient Instructions below. Patient Instructions  Medication Instructions: Your physician recommends that you continue on your current medications as directed. Please refer to the Current Medication list given to you today.   Testing/Procedures: Your physician has requested that you have a non-urgent echocardiogram after your surgery. Echocardiography is a painless test that uses sound waves to create images of your heart. It provides your doctor with information about the size and shape of your heart and how well your heart's chambers and valves are working. This procedure takes approximately one hour. There are no restrictions for this procedure.   Follow-Up: Your physician wants you to follow-up in: 6-9 months with Dr. Oval Linsey. You [name] receive a reminder letter in the mail two months in advance. If you don't receive a letter, please call our office to schedule the follow-up appointment.   Any Other Special Instructions [name] be listed below:  You have been cleared for surgery from a cardiac standpoint. I have faxed the clearance letter to [facility].   If you need a refill on your cardiac medications before your next appointment, please call your pharmacy.     Hilbert Corrigan, [place]  06/05/2017 1:31 PM    [facility] [address], [place], [place]  [address] Phone: [phone]; Fax: [phone]

## 2017-06-03 NOTE — Patient Instructions (Signed)
Medication Instructions: Your physician recommends that you continue on your current medications as directed. Please refer to the Current Medication list given to you today.   Testing/Procedures: Your physician has requested that you have a non-urgent echocardiogram after your surgery. Echocardiography is a painless test that uses sound waves to create images of your heart. It provides your doctor with information about the size and shape of your heart and how well your heart's chambers and valves are working. This procedure takes approximately one hour. There are no restrictions for this procedure.   Follow-Up: Your physician wants you to follow-up in: 6-9 months with Dr. Oval Linsey. You will receive a reminder letter in the mail two months in advance. If you don't receive a letter, please call our office to schedule the follow-up appointment.   Any Other Special Instructions will be listed below:  You have been cleared for surgery from a cardiac standpoint. I have faxed the clearance letter to [facility].   If you need a refill on your cardiac medications before your next appointment, please call your pharmacy.

## 2017-06-05 ENCOUNTER — Encounter: Payer: Self-pay | Admitting: Physician Assistant

## 2017-06-06 NOTE — Telephone Encounter (Signed)
Clearance sent after visit with Janan Ridge PA

## 2017-06-13 ENCOUNTER — Ambulatory Visit (HOSPITAL_BASED_OUTPATIENT_CLINIC_OR_DEPARTMENT_OTHER): Payer: Medicare Other | Admitting: Hematology and Oncology

## 2017-06-13 ENCOUNTER — Encounter: Payer: Self-pay | Admitting: *Deleted

## 2017-06-13 ENCOUNTER — Encounter: Payer: Self-pay | Admitting: Hematology and Oncology

## 2017-06-13 DIAGNOSIS — Z170 Estrogen receptor positive status [ER+]: Secondary | ICD-10-CM | POA: Diagnosis not present

## 2017-06-13 DIAGNOSIS — C50512 Malignant neoplasm of lower-outer quadrant of left female breast: Secondary | ICD-10-CM | POA: Diagnosis not present

## 2017-06-13 DIAGNOSIS — C773 Secondary and unspecified malignant neoplasm of axilla and upper limb lymph nodes: Secondary | ICD-10-CM | POA: Diagnosis not present

## 2017-06-13 NOTE — Assessment & Plan Note (Addendum)
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 After reviewing the pathology in detail, we proceeded to discuss the different treatment options between surgery, radiation, chemotherapy, antiestrogen therapies.  Recommendation: 1. Patient is not a candidate for chemotherapy because of her poor performance status and multiple health issues and comorbidities. 2. Mastectomy and axillary lymph node dissection 3. Followed by adjuvant radiation 4. Followed by adjuvant antiestrogen therapy  I would like to see her back one week after surgery to discuss the final pathology report.

## 2017-06-13 NOTE — Progress Notes (Signed)
[facility] CONSULT NOTE  Patient Care Team: Patient, No Pcp Per as PCP - General (General Practice)  CHIEF COMPLAINTS/PURPOSE OF CONSULTATION:  Newly diagnosed breast cancer  HISTORY OF PRESENTING ILLNESS:  [name] [name] 71 y.o. female is here because of recent diagnosis of left breast cancer. Patient had nipple inversion and the palpable lump in the left breast which was evaluated by mammogram and ultrasound. Ultrasound detected a mass that was 6 cm in size although the total measurement is 9 cm. There is subcutaneous urology appearance and some skin changes noted. 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 She also had 3 enlarged lymph nodes which were biopsy-proven to be breast cancer.  I reviewed her records extensively and collaborated the history with the patient.  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 Bronchitis   . Cerebral palsy ([place])   . Chronic diastolic CHF (congestive heart failure) ([place]) 05/27/2014  . CKD (chronic kidney disease)   . Cough 05/27/2014   ?  ACE cough August, 2015?when d/c'd   . DM type 2 with diabetic peripheral neuropathy ([place])   . Ejection fraction   . GERD (gastroesophageal reflux disease)   . Glaucoma   . Gout   . Hypercholesterolemia   . Hypertensive heart disease   . Neuropathy   . Osteoarthritis   . PAD  (peripheral artery disease) ([facility])     SURGICAL HISTORY: No past surgical history on file.  SOCIAL HISTORY: Social History   Social History  . Marital status: Single    Spouse name: N/A  . Number of children: N/A  . Years of education: N/A   Occupational History  . Not on file.   Social History Main Topics  . Smoking status: Former Smoker    Packs/day: 2.00    Years: 40.00    Types: Cigarettes    Quit date: 10/22/2003  . Smokeless tobacco: Never Used  . Alcohol use No  . Drug use: No  . Sexual activity: Not on file   Other Topics Concern  . Not on file   Social History Narrative  . No narrative on file    FAMILY HISTORY: Family History  Problem Relation Age of Onset  . Heart disease Mother   . Heart disease Father     ALLERGIES:  is allergic to miconazole.  MEDICATIONS:  Current Outpatient Prescriptions  Medication Sig Dispense Refill  . acetaminophen (TYLENOL) 325 MG tablet Take 325 mg by mouth. Take one tablet every 6 hours as needed for pain do not exceed 4 gms in 24 hours    . ADVAIR DISKUS 250-50 MCG/DOSE AEPB Inhale 1 puff into the lungs 2 (two) times daily.  0  . allopurinol (ZYLOPRIM) 300 MG tablet Take 300 mg by mouth daily.    . calcium-vitamin D (OSCAL WITH D) 500-200 MG-UNIT per tablet Take 1 tablet  by mouth 2 (two) times daily.    . Cholecalciferol 50000 units TABS Take by mouth. Take one tablet every month on the 17th for osteoporosis    . cyclobenzaprine (FLEXERIL) 10 MG tablet Take 10 mg by mouth 3 (three) times daily.     Marland Kitchen escitalopram (LEXAPRO) 5 MG tablet Take 5 mg by mouth daily.    . famotidine (PEPCID) 20 MG tablet Take 20 mg by mouth 2 (two) times daily.    . ferrous sulfate 325 (65 FE) MG tablet Take 325 mg by mouth daily with breakfast.    . furosemide (LASIX) 40 MG tablet Take 1.5 tablets (60 mg total) by mouth daily. 45 tablet 6  . gabapentin (NEURONTIN) 100 MG capsule Take 100 mg by mouth 3 (three) times daily.    Marland Kitchen guaiFENesin  (MUCINEX) 600 MG 12 hr tablet Take 600 mg by mouth 2 (two) times daily.    Marland Kitchen guaifenesin (SILTUSSIN SA) 100 MG/5ML syrup Take by mouth. 15 ml by mouth every 4 hours as needed for cough    . ipratropium (ATROVENT) 0.02 % nebulizer solution Take 0.5 mg by nebulization every 6 (six) hours as needed for wheezing or shortness of breath (SHORTNESS OF BREATH).     Marland Kitchen loratadine (CLARITIN) 10 MG tablet Take 10 mg by mouth daily.    . metFORMIN (GLUCOPHAGE) 500 MG tablet Take 500 mg by mouth 2 (two) times daily with a meal.    . olopatadine (PATANOL) 0.1 % ophthalmic solution 1 drop 2 (two) times daily.    Marland Kitchen oxybutynin (DITROPAN-XL) 10 MG 24 hr tablet Take 10 mg by mouth at bedtime.    Marland Kitchen oxyCODONE-acetaminophen (PERCOCET) 10-325 MG tablet Take one tablet by mouth three times daily for pain. Do not exceed 4gm of Tylenol in 24 hours 90 tablet 0  . potassium chloride SA (K-DUR,KLOR-CON) 20 MEQ tablet Take 20 mEq by mouth 2 (two) times daily.    Marland Kitchen senna (SENOKOT) 8.6 MG tablet Take 1 tablet by mouth daily.    . simvastatin (ZOCOR) 10 MG tablet Take 10 mg by mouth at bedtime.    . traMADol (ULTRAM) 50 MG tablet Take by mouth every 6 (six) hours as needed.    . valsartan (DIOVAN) 40 MG tablet Take 1 tablet (40 mg total) by mouth daily. 30 tablet 6   No current facility-administered medications for this visit.     REVIEW OF SYSTEMS:   Constitutional: Denies fevers, chills or abnormal night sweats Eyes: Denies blurriness of vision, double vision or watery eyes Ears, nose, mouth, throat, and face: Denies mucositis or sore throat Respiratory: Denies cough, dyspnea or wheezes Cardiovascular: Denies palpitation, chest discomfort or lower extremity swelling Gastrointestinal:  Denies nausea, heartburn or change in bowel habits Skin: Denies abnormal skin rashes Lymphatics: Denies new lymphadenopathy or easy bruising Neurological:Denies numbness, tingling or new weaknesses Behavioral/Psych: Mood is stable, no new  changes  Breast: nipple inversion on the left breast as well as a palpable mass in the breast All other systems were reviewed with the patient and are negative.  PHYSICAL EXAMINATION: ECOG PERFORMANCE STATUS: 1 - Symptomatic but completely ambulatory  Vitals:   06/13/17 1225  BP: 126/76  Pulse: 68  Resp: 20  Temp: 97.9 F (36.6 C)  SpO2: 96%   Filed Weights   06/13/17 1225  Weight: 208 lb (94.3 kg)    GENERAL:alert, no distress and comfortable SKIN: skin color, texture, turgor are normal, no rashes or significant lesions EYES: normal, conjunctiva are  pink and non-injected, sclera clear OROPHARYNX:no exudate, no erythema and lips, buccal mucosa, and tongue normal  NECK: supple, thyroid normal size, non-tender, without nodularity LYMPH:  no palpable lymphadenopathy in the cervical, axillary or inguinal LUNGS: clear to auscultation and percussion with normal breathing effort HEART: regular rate & rhythm and no murmurs and no lower extremity edema ABDOMEN:abdomen soft, non-tender and normal bowel sounds Musculoskeletal:no cyanosis of digits and no clubbing  PSYCH: alert & oriented x 3 with fluent speech NEURO: no focal motor/sensory deficits BREAST:large palpable mass at least 9-10 cm on his family and 6 cm vertically retro-areolar with nipple inversion. Palpable left axillary lymph nodes (exam performed in the presence of a chaperone)   LABORATORY DATA:  I have reviewed the data as listed Lab Results  Component Value Date   WBC 8.9 01/05/2014   HGB 15.6 (H) 01/05/2014   HCT 46.0 01/05/2014   MCV 91.3 01/05/2014   PLT 279 01/05/2014   Lab Results  Component Value Date   NA 139 06/06/2014   K 4.3 06/06/2014   CL 95 (L) 06/06/2014   CO2 32 06/06/2014    RADIOGRAPHIC STUDIES: I have personally reviewed the radiological reports and agreed with the findings in the report.  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 After reviewing the pathology in detail, we proceeded to discuss the different treatment options between surgery, radiation, chemotherapy, antiestrogen therapies.  Recommendation: 1. Patient is not a candidate for chemotherapy because of her poor performance status and multiple health issues and comorbidities. 2. Mastectomy and axillary lymph node dissection 3. Followed by adjuvant radiation 4. Followed by adjuvant antiestrogen therapy  I would like to see her back one week after surgery to discuss the final pathology report.   All questions were answered. The patient knows to call the clinic with any problems, questions or concerns.    Rulon Eisenmenger, MD 06/13/17

## 2017-06-16 ENCOUNTER — Telehealth: Payer: Self-pay | Admitting: Hematology and Oncology

## 2017-06-16 ENCOUNTER — Ambulatory Visit: Payer: Self-pay | Admitting: Surgery

## 2017-06-16 NOTE — H&P (Signed)
History of Present Illness  The patient is a 71 year old female who presents with breast cancer. Referred by Shon Hale for left breast cancer PCP - Crist Infante, FNP at [facility]  This is a 71 yo female with cerebral palsy and cognitive deficits who is a nursing home resident. She is wheelchair bound due to osteoarthritis and CHF. She is diabetic. She had a mammogram in 2/17 which was unremarkable. This summer, she was noted to have left nipple retraction and developed a large central breast mass with peau d'orange appearance of the skin. She underwent evaluation with mammogram, ultrasound, and biopsy. 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 She is referred for surgical evaluation. We discussed her case in Breast Cancer Conference earlier in the week and determined that she should consider neoadjuvant chemotherapy. She is accompanied by a medical assistant from her facility.  CLINICAL DATA: Left nipple inversion, left breast mass.  EXAM: 2D DIGITAL DIAGNOSTIC BILATERAL MAMMOGRAM WITH CAD AND ADJUNCT TOMO  ULTRASOUND BILATERAL BREAST  COMPARISON: Previous exam(s).  ACR Breast Density Category c: The breast tissue is heterogeneously dense, which may obscure small masses.  FINDINGS: Left breast: There is an irregular mass within the left breast, involving the subareolar and outer left breast, at anterior depth, measuring approximately 9 cm greatest dimension, with associated nipple retraction. There is diffuse trabecular thickening within the outer and central left breast, compatible with edema, and there is overlying skin thickening suggesting.  Right breast: There are stable benign calcifications within the right breast. There are no new dominant masses, suspicious calcifications or secondary signs of malignancy within the right breast. There is chronic right nipple  inversion which is not significantly changed compared to previous exams.  Mammographic images were processed with CAD.  Left breast: Targeted ultrasound is performed, showing an irregular hypoechoic mass within the subareolar left breast, extending towards the 3 o'clock axis, measuring more than 6 cm greatest dimension (although the measurement of 9 cm greatest dimension is more convincing on the mammogram).  Right breast: Targeted ultrasound is performed of the retroareolar right breast, showing only normal fibroglandular tissues and fat lobules. No suspicious solid or cystic mass is identified. No dilated milk ducts.  Left axilla was also evaluated with ultrasound showing at least 3 enlarged and morphologically abnormal lymph nodes, largest demonstrating a cortical thickness of 9 mm.  IMPRESSION: 1. Irregular mass within the left breast, involving the subareolar and outer left breast, measuring approximately 9 cm greatest dimension, with associated nipple retraction. This is a highly suspicious finding for which ultrasound-guided biopsy is recommended. Given the diffuse trabecular thickening within the left breast and overlying skin thickening, an inflammatory breast cancer is suspected. 2. At least 3 enlarged and morphologically abnormal lymph nodes in the left axilla. Ultrasound-guided biopsy of the dominant lymph node, with cortex thickness of 9 mm, is recommended. 3. No evidence of malignancy within the right breast.  RECOMMENDATION: 1. Ultrasound-guided biopsy of the left breast mass. 2. Ultrasound-guided biopsy of the largest lymph node in the left axilla.  Ultrasound-guided biopsy is scheduled for July 19th.  I have discussed the findings and recommendations with the patient. Results were also provided in writing at the conclusion of the visit. If applicable, a reminder letter will be sent to the patient regarding the next appointment.  BI-RADS CATEGORY  5: Highly suggestive of malignancy.   Electronically Signed By: Franki Cabot  M.D. On: 05/07/2017 15:22  CLINICAL DATA: Patient presents for ultrasound-guided core biopsy of mass in the left breast.  EXAM: ULTRASOUND GUIDED LEFT BREAST CORE NEEDLE BIOPSY  COMPARISON: Previous exam(s).  FINDINGS: I met with the patient and we discussed the procedure of ultrasound-guided biopsy, including benefits and alternatives. We discussed the high likelihood of a successful procedure. We discussed the risks of the procedure, including infection, bleeding, tissue injury, clip migration, and inadequate sampling. Informed written consent was given. The usual time-out protocol was performed immediately prior to the procedure.  Lesion quadrant: Lower outer quadrant left breast  Using sterile technique and 1% Lidocaine as local anesthetic, under direct ultrasound visualization, a 12 gauge spring-loaded device was used to perform biopsy of mass in the 3:30 o'clock location of the left breast using a is lateral approach. At the conclusion of the procedure a ribbon shaped tissue marker clip was deployed into the biopsy cavity. Follow up 2 view mammogram was performed and dictated separately.  IMPRESSION: Ultrasound guided biopsy of left breast mass. No apparent complications.  Electronically Signed: By: Nolon Nations M.D. On: 05/08/2017 14:48  CLINICAL DATA: Patient presents for ultrasound-guided core biopsy of enlarged left axillary lymph node.  EXAM: ULTRASOUND GUIDED CORE NEEDLE BIOPSY OF A LEFT AXILLARY NODE  COMPARISON: Previous exam(s).  FINDINGS: I met with the patient and we discussed the procedure of ultrasound-guided biopsy, including benefits and alternatives. We discussed the high likelihood of a successful procedure. We discussed the risks of the procedure, including infection, bleeding, tissue injury, clip migration, and inadequate sampling.  Informed written consent was given. The usual time-out protocol was performed immediately prior to the procedure.  Using sterile technique and 1% Lidocaine as local anesthetic, under direct ultrasound visualization, a 14 gauge spring-loaded device was used to perform biopsy of enlarged lower left axillary lymph node using a lateral approach. At the conclusion of the procedure a spiral shaped HydroMARK tissue marker clip was deployed into the biopsy cavity. Follow up 2 view mammogram was performed and dictated separately.  IMPRESSION: Ultrasound guided biopsy of enlarged left axillary lymph node. No apparent complications.  Electronically Signed: By: Nolon Nations M.D. On: 05/08/2017 15:03  CLINICAL DATA: Status post ultrasound-guided core biopsy of mass in the 3:30 o'clock location of the left breast and an enlarged left axillary lymph node.  EXAM: DIAGNOSTIC LEFT MAMMOGRAM POST ULTRASOUND BIOPSY x2  COMPARISON: Previous exam(s).  FINDINGS: Mammographic images were obtained following ultrasound guided biopsy of mass in the 3:30 o'clock location of the left breast and placement of a ribbon shaped clip. Ribbon shaped clip is identified in the lateral portion of the left breast as expected.  Following ultrasound-guided core biopsy of an lower left axillary lymph node, spiral shaped clip was placed and is demonstrated only on the oblique image.  IMPRESSION: Tissue marker clips are in the expected locations after biopsy.  Final Assessment: Post Procedure Mammograms for Marker Placement   Electronically Signed By: Nolon Nations M.D. On: 05/08/2017 15:05  Diagnosis 1. 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 2. Lymph node, needle/core biopsy, left axilla - MAMMARY CARCINOMA INVOLVING FIBROADIPOSE TISSUE. Microscopic Comment 1. & 2. E- Cadherin and breast prognostic profile will be performed. Dr.  Melina Copa agrees. Called to [facility] on 05/09/17. (JDP:ah 05/09/17) ADDENDUM: Immunohistochemistry for E-Cadherin shows the tumor is negative consistent with lobular carcinoma. (JDP:gt, 05/09/17) Claudette Laws MD Pathologist, Electronic Signature (Case signed 05/09/2017)  1. PROGNOSTIC INDICATORS Results: IMMUNOHISTOCHEMICAL AND  MORPHOMETRIC ANALYSIS PERFORMED MANUALLY Estrogen Receptor: 95%, POSITIVE, STRONG STAINING INTENSITY Progesterone Receptor: 95%, POSITIVE, STRONG STAINING INTENSITY Proliferation Marker Ki67: 25% REFERENCE RANGE ESTROGEN RECEPTOR NEGATIVE 0% POSITIVE =>1% REFERENCE RANGE PROGESTERONE RECEPTOR NEGATIVE 0% POSITIVE =>1% All controls stained appropriately Vicente Males MD Pathologist, Electronic Signature ( Signed 05/13/2017)   Diagnostic Studies History  Colonoscopy never  Allergies  Miconazole *Antifungals**  Medication History  Allopurinol (300MG Tablet, Oral) Active. Escitalopram Oxalate (Oral) Specific strength unknown - Active. Loratadine (10MG Tablet, Oral) Active. Patanol (0.1% Solution, Ophthalmic) Active. Valsartan (40MG Tablet, Oral) Active. Ferrous Sulfate (325 (65 Fe)MG Tablet, Oral) Active. Furosemide (80MG Tablet, Oral) Active.  Social History  Caffeine use Coffee. No alcohol use No drug use Tobacco use Never smoker.  Family History  Family history unknown First Degree Relatives  Other Problems Anxiety Disorder Arthritis Back Pain Breast Cancer Gastroesophageal Reflux Disease High blood pressure Hypercholesterolemia     Review of Systems  General Not Present- Appetite Loss, Chills, Fatigue, Fever, Night Sweats, Weight Gain and Weight Loss. Skin Not Present- Change in Wart/Mole, Dryness, Hives, Jaundice, New Lesions, Non-Healing Wounds, Rash and Ulcer. HEENT Present- Hearing Loss. Not Present- Earache, Hoarseness, Nose Bleed, Oral Ulcers, Ringing in the Ears, Seasonal  Allergies, Sinus Pain, Sore Throat, Visual Disturbances, Wears glasses/contact lenses and Yellow Eyes. Respiratory Present- Chronic Cough. Not Present- Bloody sputum, Difficulty Breathing, Snoring and Wheezing. Breast Present- Breast Mass and Skin Changes. Not Present- Breast Pain and Nipple Discharge. Cardiovascular Not Present- Chest Pain, Difficulty Breathing Lying Down, Leg Cramps, Palpitations, Rapid Heart Rate, Shortness of Breath and Swelling of Extremities. Gastrointestinal Present- Difficulty Swallowing. Not Present- Abdominal Pain, Bloating, Bloody Stool, Change in Bowel Habits, Chronic diarrhea, Constipation, Excessive gas, Gets full quickly at meals, Hemorrhoids, Indigestion, Nausea, Rectal Pain and Vomiting. Female Genitourinary Not Present- Frequency, Nocturia, Painful Urination, Pelvic Pain and Urgency. Musculoskeletal Not Present- Back Pain, Joint Pain, Joint Stiffness, Muscle Pain, Muscle Weakness and Swelling of Extremities. Neurological Present- Weakness. Not Present- Decreased Memory, Fainting, Headaches, Numbness, Seizures, Tingling, Tremor and Trouble walking. Psychiatric Present- Anxiety. Not Present- Bipolar, Change in Sleep Pattern, Depression, Fearful and Frequent crying. Endocrine Not Present- Cold Intolerance, Excessive Hunger, Hair Changes, Heat Intolerance, Hot flashes and New Diabetes.  Vitals  Weight: 208 lb Temp.: 98.11F  Pulse: 76 (Regular)  BP: 122/84 (Sitting, Left Arm, Standard)      Physical Exam  The physical exam findings are as follows: Note:Elderly female - wheelchair bound Eyes: Pupils equal, round; sclera anicteric HENT: Oral mucosa moist; good dentition; very hard of hearing Neck: No masses palpated, no thyromegaly Lungs: CTA bilaterally; normal respiratory effort CV: Regular rate and rhythm; no murmurs; extremities well-perfused with no edema Breasts: right breast - soft, no palpable masses; no axillary lymphadenopathy left  breast - inverted nipple; no nipple discharge; peau d'orange of central and lateral breast; 10 cm area of firmness in central and lateral breast No palpable axillary lymphadenopathy - patient is very obese Abd: +bowel sounds, soft, non-tender, no palpable organomegaly; no palpable hernias Skin: Warm, dry; no sign of jaundice Psychiatric - alert and oriented x 4; calm mood and affect    Assessment & Plan   LEFT BREAST CANCER WITH T3 TUMOR, >5 CM IN GREATEST DIMENSION (T24.580)  Current Plans:  We referred the patient to oncology for discussion of neoadjuvant therapy.  She is not a candidate for chemotherapy, therefore we will proceed with surgery. Schedule for Surgery - Left modified radical mastectomy with axillary lymph node dissection.  The surgical procedure  has been discussed with the patient. Potential risks, benefits, alternative treatments, and expected outcomes have been explained. All of the patient's questions at this time have been answered. The likelihood of reaching the patient's treatment goal is good. The patient understand the proposed surgical procedure and wishes to proceed.

## 2017-06-16 NOTE — Telephone Encounter (Signed)
No 8/24 los  

## 2017-06-18 ENCOUNTER — Ambulatory Visit: Payer: Self-pay | Admitting: Surgery

## 2017-06-20 ENCOUNTER — Encounter (HOSPITAL_COMMUNITY): Payer: Self-pay | Admitting: *Deleted

## 2017-06-20 ENCOUNTER — Telehealth: Payer: Self-pay | Admitting: Hematology and Oncology

## 2017-06-20 ENCOUNTER — Ambulatory Visit (HOSPITAL_COMMUNITY)
Admission: RE | Admit: 2017-06-20 | Discharge: 2017-06-20 | Disposition: A | Discharge status: Home / Self Care | Payer: Medicare Other | Source: Ambulatory Visit | Attending: Hematology and Oncology | Admitting: Hematology and Oncology

## 2017-06-20 DIAGNOSIS — D259 Leiomyoma of uterus, unspecified: Secondary | ICD-10-CM | POA: Insufficient documentation

## 2017-06-20 DIAGNOSIS — Z170 Estrogen receptor positive status [ER+]: Secondary | ICD-10-CM | POA: Diagnosis not present

## 2017-06-20 DIAGNOSIS — M12812 Other specific arthropathies, not elsewhere classified, left shoulder: Secondary | ICD-10-CM | POA: Insufficient documentation

## 2017-06-20 DIAGNOSIS — M12851 Other specific arthropathies, not elsewhere classified, right hip: Secondary | ICD-10-CM | POA: Insufficient documentation

## 2017-06-20 DIAGNOSIS — C50512 Malignant neoplasm of lower-outer quadrant of left female breast: Secondary | ICD-10-CM | POA: Diagnosis present

## 2017-06-20 DIAGNOSIS — M419 Scoliosis, unspecified: Secondary | ICD-10-CM | POA: Insufficient documentation

## 2017-06-20 DIAGNOSIS — I700 Atherosclerosis of aorta: Secondary | ICD-10-CM | POA: Insufficient documentation

## 2017-06-20 DIAGNOSIS — M12811 Other specific arthropathies, not elsewhere classified, right shoulder: Secondary | ICD-10-CM | POA: Diagnosis not present

## 2017-06-20 DIAGNOSIS — I2510 Atherosclerotic heart disease of native coronary artery without angina pectoris: Secondary | ICD-10-CM | POA: Insufficient documentation

## 2017-06-20 NOTE — Pre-Procedure Instructions (Signed)
    [name] [name]  06/20/2017    Ms. Esters's procedure is scheduled on Wednesday, June 25, 2017 at 10:00 AM.   Report to [facility] Entrance "A" Admitting Office at 7:30 AM.   Call this number if you have problems the morning of surgery: [phone]   Remember:  Pt is not to eat food or drink liquids after midnight Tuesday, 06/24/17.  Take these medicines the morning of surgery with A SIP OF WATER: Escitalopram (Lexapro), Famotidine (Pepcid), Gabapentin (Neurontin), Loratadine (Claritin), Advair Diskus,  Oxycodone OR Tramadol - if needed. She may use her eye drops.  Check patient's blood sugar when she gets up Wednesday AM and every 2 hours until she leaves for the hospital. If blood sugar is 70 or below, treat with 1/2 cup of clear juice (apple or cranberry) and recheck blood sugar 15 minutes after drinking juice. If blood sugar continues to be 70 or below, call the Short Stay department and ask to speak to a nurse.  Pt is not to take her Metformin the morning of surgery.    Do not wear jewelry, make-up or nail polish.  Do not wear lotions, powders, perfumes or deodorant.  Do not shave 48 hours prior to surgery.    Do not bring valuables to the hospital.  [facility] is not responsible for any belongings or valuables.  Contacts, dentures or bridgework may not be worn into surgery.  Leave your suitcase in the car.  After surgery it may be brought to your room.  For patients admitted to the hospital, discharge time will be determined by your treatment team.   Any questions today or Tuesday, please call me, Lilia Pro, RN at [phone]

## 2017-06-20 NOTE — Progress Notes (Signed)
Pt is a resident at [facility]. I spoke with Thresa Ross, LPN for pre-op call. She verified allergies, medications and medical history. Pt is diabetic, type 2. Last A1C was 6.5 in July, 2018. Shontae states pt's fasting blood sugar is usually between 110-160. She states pt is able to speak for herself. States pt is hard of hearing and wears a hearing aid in left ear. No cardiac history noted. Faxed pre-op instructions to [facility] at [phone]

## 2017-06-20 NOTE — Telephone Encounter (Signed)
sw Stacy RN at nursing home to confirm 9/17 appt at 1030 per sch msg

## 2017-06-24 MED ORDER — DEXTROSE 5 % IV SOLN
3.0000 g | INTRAVENOUS | Status: AC
  Administered 2017-06-25: 3 g via INTRAVENOUS
  Filled 2017-06-24: qty 3000

## 2017-06-25 ENCOUNTER — Ambulatory Visit (HOSPITAL_COMMUNITY): Payer: Medicare Other | Admitting: Certified Registered"

## 2017-06-25 ENCOUNTER — Encounter (HOSPITAL_COMMUNITY): Payer: Self-pay | Admitting: *Deleted

## 2017-06-25 ENCOUNTER — Observation Stay (HOSPITAL_COMMUNITY)
Admission: AD | Admit: 2017-06-25 | Discharge: 2017-06-27 | Disposition: A | Discharge status: Home / Self Care | Payer: Medicare Other | Source: Ambulatory Visit | Attending: Surgery | Admitting: Surgery

## 2017-06-25 ENCOUNTER — Encounter (HOSPITAL_COMMUNITY)
Admission: AD | Disposition: A | Discharge status: Home / Self Care | Payer: Self-pay | Source: Ambulatory Visit | Attending: Surgery

## 2017-06-25 DIAGNOSIS — Z87891 Personal history of nicotine dependence: Secondary | ICD-10-CM | POA: Insufficient documentation

## 2017-06-25 DIAGNOSIS — C50912 Malignant neoplasm of unspecified site of left female breast: Secondary | ICD-10-CM | POA: Diagnosis present

## 2017-06-25 DIAGNOSIS — E119 Type 2 diabetes mellitus without complications: Secondary | ICD-10-CM | POA: Insufficient documentation

## 2017-06-25 DIAGNOSIS — I10 Essential (primary) hypertension: Secondary | ICD-10-CM | POA: Diagnosis not present

## 2017-06-25 DIAGNOSIS — C773 Secondary and unspecified malignant neoplasm of axilla and upper limb lymph nodes: Secondary | ICD-10-CM | POA: Diagnosis not present

## 2017-06-25 DIAGNOSIS — F419 Anxiety disorder, unspecified: Secondary | ICD-10-CM | POA: Insufficient documentation

## 2017-06-25 DIAGNOSIS — K219 Gastro-esophageal reflux disease without esophagitis: Secondary | ICD-10-CM | POA: Insufficient documentation

## 2017-06-25 DIAGNOSIS — L8990 Pressure ulcer of unspecified site, unspecified stage: Secondary | ICD-10-CM | POA: Insufficient documentation

## 2017-06-25 DIAGNOSIS — F329 Major depressive disorder, single episode, unspecified: Secondary | ICD-10-CM | POA: Insufficient documentation

## 2017-06-25 DIAGNOSIS — Z7984 Long term (current) use of oral hypoglycemic drugs: Secondary | ICD-10-CM | POA: Diagnosis not present

## 2017-06-25 DIAGNOSIS — Z79899 Other long term (current) drug therapy: Secondary | ICD-10-CM | POA: Insufficient documentation

## 2017-06-25 HISTORY — DX: Hyperaldosteronism, unspecified: E26.9

## 2017-06-25 HISTORY — DX: Depression, unspecified: F32.A

## 2017-06-25 HISTORY — DX: Essential (primary) hypertension: I10

## 2017-06-25 HISTORY — DX: Morbid (severe) obesity due to excess calories: E66.01

## 2017-06-25 HISTORY — DX: Major depressive disorder, single episode, unspecified: F32.9

## 2017-06-25 HISTORY — DX: Anemia, unspecified: D64.9

## 2017-06-25 HISTORY — DX: Dysphagia, unspecified: R13.10

## 2017-06-25 HISTORY — DX: Anxiety disorder, unspecified: F41.9

## 2017-06-25 HISTORY — PX: MASTECTOMY WITH AXILLARY LYMPH NODE DISSECTION: SHX5661

## 2017-06-25 LAB — CBC
HCT: 42.9 % (ref 36.0–46.0)
HEMATOCRIT: 39.3 % (ref 36.0–46.0)
Hemoglobin: 13.8 g/dL (ref 12.0–15.0)
Hemoglobin: 12.5 g/dL (ref 12.0–15.0)
MCH: 31.7 pg (ref 26.0–34.0)
MCH: 31.5 pg (ref 26.0–34.0)
MCHC: 32.2 g/dL (ref 30.0–36.0)
MCHC: 31.8 g/dL (ref 30.0–36.0)
MCV: 98.6 fL (ref 78.0–100.0)
MCV: 99 fL (ref 78.0–100.0)
PLATELETS: 259 10*3/uL (ref 150–400)
Platelets: 250 10*3/uL (ref 150–400)
RBC: 4.35 MIL/uL (ref 3.87–5.11)
RBC: 3.97 MIL/uL (ref 3.87–5.11)
RDW: 14.5 % (ref 11.5–15.5)
RDW: 14.6 % (ref 11.5–15.5)
WBC: 9.8 10*3/uL (ref 4.0–10.5)
WBC: 10.9 10*3/uL — ABNORMAL HIGH (ref 4.0–10.5)

## 2017-06-25 LAB — GLUCOSE, CAPILLARY
GLUCOSE-CAPILLARY: 174 mg/dL — AB (ref 65–99)
Glucose-Capillary: 140 mg/dL — ABNORMAL HIGH (ref 65–99)
Glucose-Capillary: 123 mg/dL — ABNORMAL HIGH (ref 65–99)
Glucose-Capillary: 158 mg/dL — ABNORMAL HIGH (ref 65–99)
Glucose-Capillary: 181 mg/dL — ABNORMAL HIGH (ref 65–99)

## 2017-06-25 LAB — BASIC METABOLIC PANEL
Anion gap: 11 (ref 5–15)
BUN: 16 mg/dL (ref 6–20)
CO2: 30 mmol/L (ref 22–32)
Calcium: 9.5 mg/dL (ref 8.9–10.3)
Chloride: 99 mmol/L — ABNORMAL LOW (ref 101–111)
Creatinine, Ser: 0.8 mg/dL (ref 0.44–1.00)
GFR calc Af Amer: >60 mL/min (ref >60)
GLUCOSE: 143 mg/dL — AB (ref 65–99)
POTASSIUM: 3.6 mmol/L (ref 3.5–5.1)
Sodium: 140 mmol/L (ref 135–145)

## 2017-06-25 LAB — CREATININE, SERUM
Creatinine, Ser: 0.72 mg/dL (ref 0.44–1.00)
GFR calc Af Amer: >60 mL/min (ref >60)
GFR calc non Af Amer: >60 mL/min (ref >60)

## 2017-06-25 SURGERY — MASTECTOMY WITH AXILLARY LYMPH NODE DISSECTION
Anesthesia: Regional | Laterality: Left

## 2017-06-25 MED ORDER — FUROSEMIDE 80 MG PO TABS
80.0000 mg | ORAL_TABLET | Freq: Every day | ORAL | Status: DC
  Administered 2017-06-25 – 2017-06-27 (×3): 80 mg via ORAL
  Filled 2017-06-25 (×3): qty 1

## 2017-06-25 MED ORDER — 0.9 % SODIUM CHLORIDE (POUR BTL) OPTIME
TOPICAL | Status: DC | PRN
  Administered 2017-06-25: 1000 mL
  Administered 2017-06-25: 3000 mL

## 2017-06-25 MED ORDER — DEXAMETHASONE SODIUM PHOSPHATE 4 MG/ML IJ SOLN
INTRAMUSCULAR | Status: DC | PRN
  Administered 2017-06-25: 4 mg via INTRAVENOUS

## 2017-06-25 MED ORDER — CEFAZOLIN SODIUM-DEXTROSE 2-4 GM/100ML-% IV SOLN
2.0000 g | Freq: Three times a day (TID) | INTRAVENOUS | Status: AC
  Administered 2017-06-25: 2 g via INTRAVENOUS
  Filled 2017-06-25: qty 100

## 2017-06-25 MED ORDER — ACETAMINOPHEN 500 MG PO TABS
1000.0000 mg | ORAL_TABLET | Freq: Four times a day (QID) | ORAL | Status: DC
  Administered 2017-06-25 – 2017-06-27 (×7): 1000 mg via ORAL
  Filled 2017-06-25 (×7): qty 2

## 2017-06-25 MED ORDER — HYDROMORPHONE HCL 1 MG/ML IJ SOLN: 0.2500 mg | INTRAMUSCULAR | Status: DC | PRN

## 2017-06-25 MED ORDER — MEPERIDINE HCL 25 MG/ML IJ SOLN: 6.2500 mg | INTRAMUSCULAR | Status: DC | PRN

## 2017-06-25 MED ORDER — LORATADINE 10 MG PO TABS
10.0000 mg | ORAL_TABLET | Freq: Every day | ORAL | Status: DC
  Administered 2017-06-26 – 2017-06-27 (×2): 10 mg via ORAL
  Filled 2017-06-25 (×3): qty 1

## 2017-06-25 MED ORDER — LACTATED RINGERS IV SOLN
INTRAVENOUS | Status: DC
  Administered 2017-06-25: 10:00:00 via INTRAVENOUS

## 2017-06-25 MED ORDER — OLOPATADINE HCL 0.1 % OP SOLN
1.0000 [drp] | Freq: Every day | OPHTHALMIC | Status: DC
  Administered 2017-06-25 – 2017-06-27 (×3): 1 [drp] via OPHTHALMIC
  Filled 2017-06-25: qty 5

## 2017-06-25 MED ORDER — LIDOCAINE 2% (20 MG/ML) 5 ML SYRINGE
INTRAMUSCULAR | Status: DC | PRN
  Administered 2017-06-25: 100 mg via INTRAVENOUS

## 2017-06-25 MED ORDER — ESCITALOPRAM OXALATE 10 MG PO TABS
10.0000 mg | ORAL_TABLET | Freq: Every day | ORAL | Status: DC
  Administered 2017-06-26 – 2017-06-27 (×2): 10 mg via ORAL
  Filled 2017-06-25 (×3): qty 1

## 2017-06-25 MED ORDER — SIMVASTATIN 10 MG PO TABS
10.0000 mg | ORAL_TABLET | Freq: Every day | ORAL | Status: DC
  Administered 2017-06-25 – 2017-06-26 (×2): 10 mg via ORAL
  Filled 2017-06-25 (×2): qty 1

## 2017-06-25 MED ORDER — CHLORHEXIDINE GLUCONATE CLOTH 2 % EX PADS: 6.0000 | MEDICATED_PAD | Freq: Once | CUTANEOUS | Status: DC

## 2017-06-25 MED ORDER — ONDANSETRON HCL 4 MG/2ML IJ SOLN: 4.0000 mg | Freq: Four times a day (QID) | INTRAMUSCULAR | Status: DC | PRN

## 2017-06-25 MED ORDER — IRBESARTAN 75 MG PO TABS
75.0000 mg | ORAL_TABLET | Freq: Every day | ORAL | Status: DC
  Administered 2017-06-25 – 2017-06-26 (×2): 75 mg via ORAL
  Filled 2017-06-25 (×3): qty 1

## 2017-06-25 MED ORDER — PHENYLEPHRINE 40 MCG/ML (10ML) SYRINGE FOR IV PUSH (FOR BLOOD PRESSURE SUPPORT)
PREFILLED_SYRINGE | INTRAVENOUS | Status: DC | PRN
  Administered 2017-06-25 (×5): 80 ug via INTRAVENOUS

## 2017-06-25 MED ORDER — INSULIN ASPART 100 UNIT/ML ~~LOC~~ SOLN
0.0000 [IU] | Freq: Three times a day (TID) | SUBCUTANEOUS | Status: DC
  Administered 2017-06-25 – 2017-06-26 (×2): 3 [IU] via SUBCUTANEOUS
  Administered 2017-06-26: 2 [IU] via SUBCUTANEOUS
  Administered 2017-06-26: 3 [IU] via SUBCUTANEOUS

## 2017-06-25 MED ORDER — SODIUM CHLORIDE 0.9 % IV SOLN
INTRAVENOUS | Status: DC
  Administered 2017-06-25 – 2017-06-26 (×3): via INTRAVENOUS

## 2017-06-25 MED ORDER — BUPIVACAINE-EPINEPHRINE (PF) 0.5% -1:200000 IJ SOLN
INTRAMUSCULAR | Status: DC | PRN
  Administered 2017-06-25: 30 mL

## 2017-06-25 MED ORDER — MIDAZOLAM HCL 2 MG/2ML IJ SOLN
INTRAMUSCULAR | Status: AC
  Administered 2017-06-25: 2 mg via INTRAVENOUS
  Filled 2017-06-25: qty 2

## 2017-06-25 MED ORDER — FENTANYL CITRATE (PF) 100 MCG/2ML IJ SOLN
INTRAMUSCULAR | Status: AC
  Filled 2017-06-25: qty 2

## 2017-06-25 MED ORDER — DOCUSATE SODIUM 100 MG PO CAPS
100.0000 mg | ORAL_CAPSULE | Freq: Two times a day (BID) | ORAL | Status: DC
  Administered 2017-06-25 – 2017-06-27 (×4): 100 mg via ORAL
  Filled 2017-06-25 (×4): qty 1

## 2017-06-25 MED ORDER — ENOXAPARIN SODIUM 40 MG/0.4ML ~~LOC~~ SOLN
40.0000 mg | SUBCUTANEOUS | Status: DC
  Administered 2017-06-26 – 2017-06-27 (×2): 40 mg via SUBCUTANEOUS
  Filled 2017-06-25 (×2): qty 0.4

## 2017-06-25 MED ORDER — POTASSIUM CHLORIDE CRYS ER 20 MEQ PO TBCR
20.0000 meq | EXTENDED_RELEASE_TABLET | Freq: Two times a day (BID) | ORAL | Status: DC
  Administered 2017-06-25 – 2017-06-27 (×5): 20 meq via ORAL
  Filled 2017-06-25 (×5): qty 1

## 2017-06-25 MED ORDER — METOPROLOL TARTRATE 25 MG PO TABS
25.0000 mg | ORAL_TABLET | Freq: Two times a day (BID) | ORAL | Status: DC
  Administered 2017-06-25 – 2017-06-26 (×4): 25 mg via ORAL
  Filled 2017-06-25 (×5): qty 1

## 2017-06-25 MED ORDER — EPHEDRINE SULFATE-NACL 50-0.9 MG/10ML-% IV SOSY
PREFILLED_SYRINGE | INTRAVENOUS | Status: DC | PRN
  Administered 2017-06-25: 10 mg via INTRAVENOUS
  Administered 2017-06-25: 5 mg via INTRAVENOUS
  Administered 2017-06-25: 10 mg via INTRAVENOUS

## 2017-06-25 MED ORDER — MIDAZOLAM HCL 2 MG/2ML IJ SOLN
2.0000 mg | Freq: Once | INTRAMUSCULAR | Status: AC
Start: 2017-06-25 — End: 2017-06-25
  Administered 2017-06-25: 2 mg via INTRAVENOUS

## 2017-06-25 MED ORDER — DIPHENHYDRAMINE HCL 12.5 MG/5ML PO ELIX: 12.5000 mg | ORAL_SOLUTION | Freq: Four times a day (QID) | ORAL | Status: DC | PRN

## 2017-06-25 MED ORDER — FENTANYL CITRATE (PF) 100 MCG/2ML IJ SOLN
INTRAMUSCULAR | Status: DC | PRN
  Administered 2017-06-25: 50 ug via INTRAVENOUS
  Administered 2017-06-25: 25 ug via INTRAVENOUS
  Administered 2017-06-25: 50 ug via INTRAVENOUS

## 2017-06-25 MED ORDER — ONDANSETRON HCL 4 MG/2ML IJ SOLN
INTRAMUSCULAR | Status: DC | PRN
  Administered 2017-06-25: 4 mg via INTRAVENOUS

## 2017-06-25 MED ORDER — ALLOPURINOL 300 MG PO TABS
300.0000 mg | ORAL_TABLET | Freq: Every day | ORAL | Status: DC
  Administered 2017-06-25 – 2017-06-27 (×3): 300 mg via ORAL
  Filled 2017-06-25 (×3): qty 1

## 2017-06-25 MED ORDER — DIPHENHYDRAMINE HCL 50 MG/ML IJ SOLN: 12.5000 mg | Freq: Four times a day (QID) | INTRAMUSCULAR | Status: DC | PRN

## 2017-06-25 MED ORDER — METOPROLOL TARTRATE 12.5 MG HALF TABLET
ORAL_TABLET | ORAL | Status: AC
  Administered 2017-06-25: 25 mg via ORAL
  Filled 2017-06-25: qty 2

## 2017-06-25 MED ORDER — METHOCARBAMOL 500 MG PO TABS
500.0000 mg | ORAL_TABLET | Freq: Four times a day (QID) | ORAL | Status: DC | PRN
  Administered 2017-06-25 – 2017-06-26 (×3): 500 mg via ORAL
  Filled 2017-06-25 (×3): qty 1

## 2017-06-25 MED ORDER — METOPROLOL TARTRATE 25 MG PO TABS
25.0000 mg | ORAL_TABLET | Freq: Once | ORAL | Status: AC
  Administered 2017-06-25: 25 mg via ORAL

## 2017-06-25 MED ORDER — MORPHINE SULFATE (PF) 4 MG/ML IV SOLN: 2.0000 mg | INTRAVENOUS | Status: DC | PRN

## 2017-06-25 MED ORDER — PROPOFOL 10 MG/ML IV BOLUS
INTRAVENOUS | Status: AC
  Filled 2017-06-25: qty 20

## 2017-06-25 MED ORDER — PHENYLEPHRINE HCL 10 MG/ML IJ SOLN
INTRAVENOUS | Status: DC | PRN
  Administered 2017-06-25: 40 ug/min via INTRAVENOUS

## 2017-06-25 MED ORDER — GUAIFENESIN ER 600 MG PO TB12
600.0000 mg | ORAL_TABLET | Freq: Two times a day (BID) | ORAL | Status: DC
  Administered 2017-06-25 – 2017-06-27 (×5): 600 mg via ORAL
  Filled 2017-06-25 (×5): qty 1

## 2017-06-25 MED ORDER — IPRATROPIUM-ALBUTEROL 0.5-2.5 (3) MG/3ML IN SOLN: 3.0000 mL | Freq: Four times a day (QID) | RESPIRATORY_TRACT | Status: DC | PRN

## 2017-06-25 MED ORDER — OXYCODONE HCL 5 MG PO TABS
5.0000 mg | ORAL_TABLET | ORAL | Status: DC | PRN
  Administered 2017-06-25: 5 mg via ORAL
  Administered 2017-06-26 (×2): 10 mg via ORAL
  Filled 2017-06-25: qty 1
  Filled 2017-06-25 (×2): qty 2

## 2017-06-25 MED ORDER — PROPOFOL 10 MG/ML IV BOLUS
INTRAVENOUS | Status: DC | PRN
  Administered 2017-06-25: 150 mg via INTRAVENOUS

## 2017-06-25 MED ORDER — METFORMIN HCL 500 MG PO TABS
500.0000 mg | ORAL_TABLET | Freq: Two times a day (BID) | ORAL | Status: DC
  Administered 2017-06-25 – 2017-06-27 (×5): 500 mg via ORAL
  Filled 2017-06-25 (×5): qty 1

## 2017-06-25 MED ORDER — GABAPENTIN 300 MG PO CAPS
300.0000 mg | ORAL_CAPSULE | Freq: Three times a day (TID) | ORAL | Status: DC
  Administered 2017-06-25 – 2017-06-27 (×6): 300 mg via ORAL
  Filled 2017-06-25 (×6): qty 1

## 2017-06-25 MED ORDER — ONDANSETRON HCL 4 MG/2ML IJ SOLN: 4.0000 mg | Freq: Once | INTRAMUSCULAR | Status: DC | PRN

## 2017-06-25 MED ORDER — ONDANSETRON 4 MG PO TBDP: 4.0000 mg | ORAL_TABLET | Freq: Four times a day (QID) | ORAL | Status: DC | PRN

## 2017-06-25 MED ORDER — MOMETASONE FURO-FORMOTEROL FUM 200-5 MCG/ACT IN AERO
2.0000 | INHALATION_SPRAY | Freq: Two times a day (BID) | RESPIRATORY_TRACT | Status: DC
  Administered 2017-06-25 – 2017-06-27 (×3): 2 via RESPIRATORY_TRACT
  Filled 2017-06-25: qty 8.8

## 2017-06-25 MED ORDER — PANTOPRAZOLE SODIUM 40 MG PO TBEC
40.0000 mg | DELAYED_RELEASE_TABLET | Freq: Every day | ORAL | Status: DC
  Administered 2017-06-25 – 2017-06-27 (×3): 40 mg via ORAL
  Filled 2017-06-25 (×3): qty 1

## 2017-06-25 MED ORDER — FENTANYL CITRATE (PF) 250 MCG/5ML IJ SOLN
INTRAMUSCULAR | Status: AC
  Filled 2017-06-25: qty 5

## 2017-06-25 MED ORDER — LACTATED RINGERS IV SOLN
INTRAVENOUS | Status: DC | PRN
  Administered 2017-06-25 (×2): via INTRAVENOUS

## 2017-06-25 SURGICAL SUPPLY — 44 items
APPLIER CLIP 9.375 MED OPEN (MISCELLANEOUS) ×3
BINDER BREAST XXLRG (GAUZE/BANDAGES/DRESSINGS) ×3 IMPLANT
CANISTER SUCT 3000ML PPV (MISCELLANEOUS) ×3 IMPLANT
CHLORAPREP W/TINT 26ML (MISCELLANEOUS) ×3 IMPLANT
CLIP APPLIE 9.375 MED OPEN (MISCELLANEOUS) ×1 IMPLANT
CONT SPEC 4OZ CLIKSEAL STRL BL (MISCELLANEOUS) ×3 IMPLANT
COVER SURGICAL LIGHT HANDLE (MISCELLANEOUS) ×3 IMPLANT
DRAIN CHANNEL 19F RND (DRAIN) ×6 IMPLANT
DRSG PAD ABDOMINAL 8X10 ST (GAUZE/BANDAGES/DRESSINGS) ×6 IMPLANT
ELECT BLADE 4.0 EZ CLEAN MEGAD (MISCELLANEOUS) ×3
ELECT CAUTERY BLADE 6.4 (BLADE) ×3 IMPLANT
ELECT REM PT RETURN 9FT ADLT (ELECTROSURGICAL) ×3
ELECTRODE BLDE 4.0 EZ CLN MEGD (MISCELLANEOUS) ×1 IMPLANT
ELECTRODE REM PT RTRN 9FT ADLT (ELECTROSURGICAL) ×1 IMPLANT
EVACUATOR SILICONE 100CC (DRAIN) ×6 IMPLANT
GAUZE SPONGE 4X4 12PLY STRL (GAUZE/BANDAGES/DRESSINGS) ×3 IMPLANT
GAUZE XEROFORM 5X9 LF (GAUZE/BANDAGES/DRESSINGS) ×3 IMPLANT
GLOVE BIO SURGEON STRL SZ7 (GLOVE) ×9 IMPLANT
GLOVE BIOGEL PI IND STRL 6 (GLOVE) ×2 IMPLANT
GLOVE BIOGEL PI IND STRL 6.5 (GLOVE) ×1 IMPLANT
GLOVE BIOGEL PI IND STRL 7.5 (GLOVE) ×1 IMPLANT
GLOVE BIOGEL PI INDICATOR 6 (GLOVE) ×4
GLOVE BIOGEL PI INDICATOR 6.5 (GLOVE) ×2
GLOVE BIOGEL PI INDICATOR 7.5 (GLOVE) ×2
GLOVE SURG SS PI 6.0 STRL IVOR (GLOVE) ×9 IMPLANT
GOWN STRL REUS W/ TWL LRG LVL3 (GOWN DISPOSABLE) ×4 IMPLANT
GOWN STRL REUS W/ TWL XL LVL3 (GOWN DISPOSABLE) ×1 IMPLANT
GOWN STRL REUS W/TWL LRG LVL3 (GOWN DISPOSABLE) ×8
GOWN STRL REUS W/TWL XL LVL3 (GOWN DISPOSABLE) ×2
KIT BASIN OR (CUSTOM PROCEDURE TRAY) ×3 IMPLANT
KIT ROOM TURNOVER OR (KITS) ×3 IMPLANT
NS IRRIG 1000ML POUR BTL (IV SOLUTION) ×3 IMPLANT
PACK GENERAL/GYN (CUSTOM PROCEDURE TRAY) ×3 IMPLANT
PACK UNIVERSAL I (CUSTOM PROCEDURE TRAY) ×3 IMPLANT
PAD ABD 8X10 STRL (GAUZE/BANDAGES/DRESSINGS) ×3 IMPLANT
PAD ARMBOARD 7.5X6 YLW CONV (MISCELLANEOUS) ×3 IMPLANT
SPECIMEN JAR X LARGE (MISCELLANEOUS) ×3 IMPLANT
SPONGE LAP 18X18 X RAY DECT (DISPOSABLE) ×6 IMPLANT
STAPLER VISISTAT 35W (STAPLE) ×3 IMPLANT
SUT ETHILON 2 0 PSLX (SUTURE) ×6 IMPLANT
SUT SILK 2 0 FS (SUTURE) ×3 IMPLANT
SUT VIC AB 3-0 SH 18 (SUTURE) ×6 IMPLANT
TOWEL OR 17X24 6PK STRL BLUE (TOWEL DISPOSABLE) ×3 IMPLANT
TOWEL OR 17X26 10 PK STRL BLUE (TOWEL DISPOSABLE) ×3 IMPLANT

## 2017-06-25 NOTE — Anesthesia Postprocedure Evaluation (Signed)
Anesthesia Post Note  Patient: Iver Fehrenbach Manthei  Procedure(s) Performed: Procedure(s) (LRB): LEFT MODIFIED RADICAL MASTECTOMY WITH AXILLARY LYMPH NODE DISSECTION ERAS PATHWAY (Left)     Patient location during evaluation: PACU Anesthesia Type: Regional Level of consciousness: awake and alert Pain management: pain level controlled Vital Signs Assessment: post-procedure vital signs reviewed and stable Respiratory status: spontaneous breathing, nonlabored ventilation, respiratory function stable and patient connected to nasal cannula oxygen Cardiovascular status: blood pressure returned to baseline and stable Postop Assessment: no signs of nausea or vomiting Anesthetic complications: no    Last Vitals:  Vitals:   06/25/17 1440 06/25/17 1458  BP: (!) 152/81 (!) 142/80  Pulse: 81 82  Resp: 13 16  Temp:  36.9 C  SpO2: 96% 92%    Last Pain:  Vitals:   06/25/17 1458  TempSrc: Oral  PainSc:                  Lindi Abram DAVID

## 2017-06-25 NOTE — Anesthesia Preprocedure Evaluation (Signed)
Anesthesia Evaluation  Patient identified by MRN, date of birth, ID band Patient awake    Reviewed: Allergy & Precautions, NPO status , Patient's Chart, lab work & pertinent test results  Airway Mallampati: II  TM Distance: >3 FB Neck ROM: Full    Dental   Pulmonary former smoker,    Pulmonary exam normal        Cardiovascular hypertension, Pt. on medications Normal cardiovascular exam     Neuro/Psych Anxiety Depression    GI/Hepatic GERD  Medicated and Controlled,  Endo/Other  diabetes, Type 2, Oral Hypoglycemic Agents  Renal/GU      Musculoskeletal   Abdominal   Peds  Hematology   Anesthesia Other Findings   Reproductive/Obstetrics                             Anesthesia Physical Anesthesia Plan  ASA: III  Anesthesia Plan: General   Post-op Pain Management:  Regional for Post-op pain   Induction: Intravenous  PONV Risk Score and Plan: 3 and Ondansetron, Dexamethasone, Midazolam and Treatment may vary due to age or medical condition  Airway Management Planned: LMA  Additional Equipment:   Intra-op Plan:   Post-operative Plan: Extubation in OR  Informed Consent: I have reviewed the patients History and Physical, chart, labs and discussed the procedure including the risks, benefits and alternatives for the proposed anesthesia with the patient or authorized representative who has indicated his/her understanding and acceptance.     Plan Discussed with: CRNA and Surgeon  Anesthesia Plan Comments:         Anesthesia Quick Evaluation

## 2017-06-25 NOTE — Care Management Note (Signed)
Case Management Note  Patient Details  Name: [name] [name] MRN: [id number] Date of Birth: 13-Jul-1946  Subjective/Objective:                    Action/Plan:  From [facility] Expected Discharge Date:                  Expected Discharge Plan:  [place]  In-House Referral:  Clinical Social Work  Discharge planning Services     Post Acute Care Choice:    Choice offered to:     DME Arranged:    DME Agency:     HH Arranged:    [place] Agency:     Status of Service:  In process, will continue to follow  If discussed at Long Length of Stay Meetings, dates discussed:    Additional Comments:  Marilu Favre, RN 06/25/2017, 3:52 PM

## 2017-06-25 NOTE — Anesthesia Procedure Notes (Signed)
Procedure Name: LMA Insertion Date/Time: 06/25/2017 10:59 AM Performed by: Orlie Dakin Pre-anesthesia Checklist: Emergency Drugs available, Patient identified, Suction available, Patient being monitored and Timeout performed Patient Re-evaluated:Patient Re-evaluated prior to induction Oxygen Delivery Method: Circle system utilized Preoxygenation: Pre-oxygenation with 100% oxygen Induction Type: IV induction Ventilation: Mask ventilation without difficulty LMA: LMA inserted LMA Size: 4.0 Number of attempts: 1 Placement Confirmation: positive ETCO2 and breath sounds checked- equal and bilateral Tube secured with: Tape Dental Injury: Teeth and Oropharynx as per pre-operative assessment

## 2017-06-25 NOTE — Interval H&P Note (Signed)
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 After consideration of risks, benefits and other options for treatment, the patient has consented to  Procedure(s): LEFT MODIFIED RADICAL MASTECTOMY WITH AXILLARY [place] (Left) as a surgical intervention .  The patient's history has been reviewed, patient examined, no change in status, stable for surgery.  I have reviewed the patient's chart and labs.  Questions were answered to the patient's satisfaction.     Chuck Caban K.

## 2017-06-25 NOTE — Progress Notes (Signed)
Patient received from OR with adult diaper slightly incontinent of urine. Upon removal of diaper patient found to have Stage II breakdown to both buttocks.Left buttock presents with dry pink edges with center of wound 1.5cm wide x 7cm long. Bright pink in color. Right buttock presents with pink dry edges with area 2cm circular bright pink area.  Peri care performed foam dressing applied to each buttock. MD notified of status. Wound care consult placed.

## 2017-06-25 NOTE — Anesthesia Procedure Notes (Signed)
Anesthesia Regional Block: Pectoralis block   Pre-Anesthetic Checklist: ,, timeout performed, Correct Patient, Correct Site, Correct Laterality, Correct Procedure, Correct Position, site marked, Risks and benefits discussed,  Surgical consent,  Pre-op evaluation,  At surgeon's request and post-op pain management  Laterality: Left  Prep: chloraprep       Needles:  Injection technique: Single-shot     Needle Length: 9cm  Needle Gauge: 21     Additional Needles:   Procedures: ultrasound guided,,,,,,,,  Narrative:  Start time: 06/25/2017 10:26 AM End time: 06/25/2017 10:36 AM Injection made incrementally with aspirations every 5 mL.  Performed by: Personally  Anesthesiologist: Lillia Abed  Additional Notes: Monitors applied. Patient sedated. Sterile prep and drape,hand hygiene and sterile gloves were used. Relevant anatomy identified.Needle position confirmed.Local anesthetic injected incrementally after negative aspiration. Local anesthetic spread visualized. Vascular puncture avoided. No complications. Image printed for medical record.The patient tolerated the procedure well.

## 2017-06-25 NOTE — H&P (View-Only) (Signed)
History of Present Illness  The patient is a 71 year old female who presents with breast cancer. Referred by Shon Hale for left breast cancer PCP - Crist Infante, FNP at [facility]  This is a 71 yo female with cerebral palsy and cognitive deficits who is a nursing home resident. She is wheelchair bound due to osteoarthritis and CHF. She is diabetic. She had a mammogram in 2/17 which was unremarkable. This summer, she was noted to have left nipple retraction and developed a large central breast mass with peau d'orange appearance of the skin. She underwent evaluation with mammogram, ultrasound, and biopsy. 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 She is referred for surgical evaluation. We discussed her case in Breast Cancer Conference earlier in the week and determined that she should consider neoadjuvant chemotherapy. She is accompanied by a medical assistant from her facility.  CLINICAL DATA: Left nipple inversion, left breast mass.  EXAM: 2D DIGITAL DIAGNOSTIC BILATERAL MAMMOGRAM WITH CAD AND ADJUNCT TOMO  ULTRASOUND BILATERAL BREAST  COMPARISON: Previous exam(s).  ACR Breast Density Category c: The breast tissue is heterogeneously dense, which may obscure small masses.  FINDINGS: Left breast: There is an irregular mass within the left breast, involving the subareolar and outer left breast, at anterior depth, measuring approximately 9 cm greatest dimension, with associated nipple retraction. There is diffuse trabecular thickening within the outer and central left breast, compatible with edema, and there is overlying skin thickening suggesting.  Right breast: There are stable benign calcifications within the right breast. There are no new dominant masses, suspicious calcifications or secondary signs of malignancy within the right breast. There is chronic right nipple  inversion which is not significantly changed compared to previous exams.  Mammographic images were processed with CAD.  Left breast: Targeted ultrasound is performed, showing an irregular hypoechoic mass within the subareolar left breast, extending towards the 3 o'clock axis, measuring more than 6 cm greatest dimension (although the measurement of 9 cm greatest dimension is more convincing on the mammogram).  Right breast: Targeted ultrasound is performed of the retroareolar right breast, showing only normal fibroglandular tissues and fat lobules. No suspicious solid or cystic mass is identified. No dilated milk ducts.  Left axilla was also evaluated with ultrasound showing at least 3 enlarged and morphologically abnormal lymph nodes, largest demonstrating a cortical thickness of 9 mm.  IMPRESSION: 1. Irregular mass within the left breast, involving the subareolar and outer left breast, measuring approximately 9 cm greatest dimension, with associated nipple retraction. This is a highly suspicious finding for which ultrasound-guided biopsy is recommended. Given the diffuse trabecular thickening within the left breast and overlying skin thickening, an inflammatory breast cancer is suspected. 2. At least 3 enlarged and morphologically abnormal lymph nodes in the left axilla. Ultrasound-guided biopsy of the dominant lymph node, with cortex thickness of 9 mm, is recommended. 3. No evidence of malignancy within the right breast.  RECOMMENDATION: 1. Ultrasound-guided biopsy of the left breast mass. 2. Ultrasound-guided biopsy of the largest lymph node in the left axilla.  Ultrasound-guided biopsy is scheduled for July 19th.  I have discussed the findings and recommendations with the patient. Results were also provided in writing at the conclusion of the visit. If applicable, a reminder letter will be sent to the patient regarding the next appointment.  BI-RADS CATEGORY  5: Highly suggestive of malignancy.   Electronically Signed By: Franki Cabot  M.D. On: 05/07/2017 15:22  CLINICAL DATA: Patient presents for ultrasound-guided core biopsy of mass in the left breast.  EXAM: ULTRASOUND GUIDED LEFT BREAST CORE NEEDLE BIOPSY  COMPARISON: Previous exam(s).  FINDINGS: I met with the patient and we discussed the procedure of ultrasound-guided biopsy, including benefits and alternatives. We discussed the high likelihood of a successful procedure. We discussed the risks of the procedure, including infection, bleeding, tissue injury, clip migration, and inadequate sampling. Informed written consent was given. The usual time-out protocol was performed immediately prior to the procedure.  Lesion quadrant: Lower outer quadrant left breast  Using sterile technique and 1% Lidocaine as local anesthetic, under direct ultrasound visualization, a 12 gauge spring-loaded device was used to perform biopsy of mass in the 3:30 o'clock location of the left breast using a is lateral approach. At the conclusion of the procedure a ribbon shaped tissue marker clip was deployed into the biopsy cavity. Follow up 2 view mammogram was performed and dictated separately.  IMPRESSION: Ultrasound guided biopsy of left breast mass. No apparent complications.  Electronically Signed: By: Nolon Nations M.D. On: 05/08/2017 14:48  CLINICAL DATA: Patient presents for ultrasound-guided core biopsy of enlarged left axillary lymph node.  EXAM: ULTRASOUND GUIDED CORE NEEDLE BIOPSY OF A LEFT AXILLARY NODE  COMPARISON: Previous exam(s).  FINDINGS: I met with the patient and we discussed the procedure of ultrasound-guided biopsy, including benefits and alternatives. We discussed the high likelihood of a successful procedure. We discussed the risks of the procedure, including infection, bleeding, tissue injury, clip migration, and inadequate sampling.  Informed written consent was given. The usual time-out protocol was performed immediately prior to the procedure.  Using sterile technique and 1% Lidocaine as local anesthetic, under direct ultrasound visualization, a 14 gauge spring-loaded device was used to perform biopsy of enlarged lower left axillary lymph node using a lateral approach. At the conclusion of the procedure a spiral shaped HydroMARK tissue marker clip was deployed into the biopsy cavity. Follow up 2 view mammogram was performed and dictated separately.  IMPRESSION: Ultrasound guided biopsy of enlarged left axillary lymph node. No apparent complications.  Electronically Signed: By: Nolon Nations M.D. On: 05/08/2017 15:03  CLINICAL DATA: Status post ultrasound-guided core biopsy of mass in the 3:30 o'clock location of the left breast and an enlarged left axillary lymph node.  EXAM: DIAGNOSTIC LEFT MAMMOGRAM POST ULTRASOUND BIOPSY x2  COMPARISON: Previous exam(s).  FINDINGS: Mammographic images were obtained following ultrasound guided biopsy of mass in the 3:30 o'clock location of the left breast and placement of a ribbon shaped clip. Ribbon shaped clip is identified in the lateral portion of the left breast as expected.  Following ultrasound-guided core biopsy of an lower left axillary lymph node, spiral shaped clip was placed and is demonstrated only on the oblique image.  IMPRESSION: Tissue marker clips are in the expected locations after biopsy.  Final Assessment: Post Procedure Mammograms for Marker Placement   Electronically Signed By: Nolon Nations M.D. On: 05/08/2017 15:05  Diagnosis 1. 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 2. Lymph node, needle/core biopsy, left axilla - MAMMARY CARCINOMA INVOLVING FIBROADIPOSE TISSUE. Microscopic Comment 1. & 2. E- Cadherin and breast prognostic profile will be performed. Dr.  Melina Copa agrees. Called to [facility] on 05/09/17. (JDP:ah 05/09/17) ADDENDUM: Immunohistochemistry for E-Cadherin shows the tumor is negative consistent with lobular carcinoma. (JDP:gt, 05/09/17) Claudette Laws MD Pathologist, Electronic Signature (Case signed 05/09/2017)  1. PROGNOSTIC INDICATORS Results: IMMUNOHISTOCHEMICAL AND  MORPHOMETRIC ANALYSIS PERFORMED MANUALLY Estrogen Receptor: 95%, POSITIVE, STRONG STAINING INTENSITY Progesterone Receptor: 95%, POSITIVE, STRONG STAINING INTENSITY Proliferation Marker Ki67: 25% REFERENCE RANGE ESTROGEN RECEPTOR NEGATIVE 0% POSITIVE =>1% REFERENCE RANGE PROGESTERONE RECEPTOR NEGATIVE 0% POSITIVE =>1% All controls stained appropriately Vicente Males MD Pathologist, Electronic Signature ( Signed 05/13/2017)   Diagnostic Studies History  Colonoscopy never  Allergies  Miconazole *Antifungals**  Medication History  Allopurinol (300MG Tablet, Oral) Active. Escitalopram Oxalate (Oral) Specific strength unknown - Active. Loratadine (10MG Tablet, Oral) Active. Patanol (0.1% Solution, Ophthalmic) Active. Valsartan (40MG Tablet, Oral) Active. Ferrous Sulfate (325 (65 Fe)MG Tablet, Oral) Active. Furosemide (80MG Tablet, Oral) Active.  Social History  Caffeine use Coffee. No alcohol use No drug use Tobacco use Never smoker.  Family History  Family history unknown First Degree Relatives  Other Problems Anxiety Disorder Arthritis Back Pain Breast Cancer Gastroesophageal Reflux Disease High blood pressure Hypercholesterolemia     Review of Systems  General Not Present- Appetite Loss, Chills, Fatigue, Fever, Night Sweats, Weight Gain and Weight Loss. Skin Not Present- Change in Wart/Mole, Dryness, Hives, Jaundice, New Lesions, Non-Healing Wounds, Rash and Ulcer. HEENT Present- Hearing Loss. Not Present- Earache, Hoarseness, Nose Bleed, Oral Ulcers, Ringing in the Ears, Seasonal  Allergies, Sinus Pain, Sore Throat, Visual Disturbances, Wears glasses/contact lenses and Yellow Eyes. Respiratory Present- Chronic Cough. Not Present- Bloody sputum, Difficulty Breathing, Snoring and Wheezing. Breast Present- Breast Mass and Skin Changes. Not Present- Breast Pain and Nipple Discharge. Cardiovascular Not Present- Chest Pain, Difficulty Breathing Lying Down, Leg Cramps, Palpitations, Rapid Heart Rate, Shortness of Breath and Swelling of Extremities. Gastrointestinal Present- Difficulty Swallowing. Not Present- Abdominal Pain, Bloating, Bloody Stool, Change in Bowel Habits, Chronic diarrhea, Constipation, Excessive gas, Gets full quickly at meals, Hemorrhoids, Indigestion, Nausea, Rectal Pain and Vomiting. Female Genitourinary Not Present- Frequency, Nocturia, Painful Urination, Pelvic Pain and Urgency. Musculoskeletal Not Present- Back Pain, Joint Pain, Joint Stiffness, Muscle Pain, Muscle Weakness and Swelling of Extremities. Neurological Present- Weakness. Not Present- Decreased Memory, Fainting, Headaches, Numbness, Seizures, Tingling, Tremor and Trouble walking. Psychiatric Present- Anxiety. Not Present- Bipolar, Change in Sleep Pattern, Depression, Fearful and Frequent crying. Endocrine Not Present- Cold Intolerance, Excessive Hunger, Hair Changes, Heat Intolerance, Hot flashes and New Diabetes.  Vitals  Weight: 208 lb Temp.: 98.11F  Pulse: 76 (Regular)  BP: 122/84 (Sitting, Left Arm, Standard)      Physical Exam  The physical exam findings are as follows: Note:Elderly female - wheelchair bound Eyes: Pupils equal, round; sclera anicteric HENT: Oral mucosa moist; good dentition; very hard of hearing Neck: No masses palpated, no thyromegaly Lungs: CTA bilaterally; normal respiratory effort CV: Regular rate and rhythm; no murmurs; extremities well-perfused with no edema Breasts: right breast - soft, no palpable masses; no axillary lymphadenopathy left  breast - inverted nipple; no nipple discharge; peau d'orange of central and lateral breast; 10 cm area of firmness in central and lateral breast No palpable axillary lymphadenopathy - patient is very obese Abd: +bowel sounds, soft, non-tender, no palpable organomegaly; no palpable hernias Skin: Warm, dry; no sign of jaundice Psychiatric - alert and oriented x 4; calm mood and affect    Assessment & Plan   LEFT BREAST CANCER WITH T3 TUMOR, >5 CM IN GREATEST DIMENSION (T24.580)  Current Plans:  We referred the patient to oncology for discussion of neoadjuvant therapy.  She is not a candidate for chemotherapy, therefore we will proceed with surgery. Schedule for Surgery - Left modified radical mastectomy with axillary lymph node dissection.  The surgical procedure  has been discussed with the patient. Potential risks, benefits, alternative treatments, and expected outcomes have been explained. All of the patient's questions at this time have been answered. The likelihood of reaching the patient's treatment goal is good. The patient understand the proposed surgical procedure and wishes to proceed.

## 2017-06-25 NOTE — Op Note (Signed)
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 Asst.: Judyann Munson RNFA Anesthesia: Gen. endotracheal Indications: This is a 71 year old female with cerebral palsy and cognitive deficits who resides in a nursing home. She is wheelchair bound. She has congestive heart failure. She was noted to have left nipple retraction and a large hard mass in the central left breast with thickening of the overlying skin. Mammogram also sent a biopsy showed a large invasive mammary carcinoma with positive left axillary lymph node. She was referred for oncologic evaluation but she was not felt a candidate for chemotherapy.  She presents now for mastectomy and lymph node dissection.  Description of procedure: The patient is brought to the operating room placed supine position operative table. After adequate level general anesthesia was obtained she was positioned at the very edge of the table with a ball underneath her left shoulder. We prepped her entire left chest and axilla and upper extremity with ChloraPrep. Her forearm was isolated with an impervious stockinette and Coban and tape. We draped sterile fashion. A timeout was taken to ensure the proper patient and proper procedure. The nipple is severely retracted. The breast tissue lateral to the nipple is very thickened with a peau d'orange appearance.  We outlined an incision that was elliptical in shape that included all of the thickened skin. We carried this far laterally around all the thickened tissue. We open the incision with a #10 blade. We dissected superiorly until we reached the infraclavicular fascia. Medially we went to the edge of the sternum. We dissected laterally until we reached the edge of the pectoralis major. I then opened the lower  part of the elliptical incision and raised our inferior flaps. We dissected down to the chest wall at inframammary crease. We took this dissection laterally until we reach the latissimus dorsi muscle. We then dissected the breast off of the underlying pectoralis including the anterior pectoralis fashion. When we reached the latissimus laterally we dissected superiorly and entered the axillary contents. We dissected posterior to the edge of the pectoralis major muscle and carefully dissected until we identified the axillary vein. The long thoracic nerve was preserved posteriorly. We dissected laterally and identified the thoracodorsal bundle. We could palpate several firm lymph nodes within the axilla contents. We tried a take all these palpable lymph nodes. The entire specimen was removed and oriented with a suture medial. We irrigated thoroughly and inspected for hemostasis. Two 19 Blake drains were inserted through stab incisions.  One drain was placed in the axilla and the other was placed across the mastectomy site. The drains were secured with 3-0 Ethilon sutures. We had to mobilize some of the posterior lateral edge of the skin flaps medially to try to close the mastectomy site since we had to remove some skin with our incision. Some of the lateral flap was tacked down to the latissimus muscle. We closed the skin flaps with subcutaneous interrupted 3-0 Vicryl sutures. There is some tension medially but the skin edges were able to meet without any sign of undue tension or ischemia. We then closed entire incision with staples. The drains were placed to bulb suction. The wound was dressed and a breast binder was applied. The patient was then extubated and brought to the recovery room in stable condition. All sponge, instrument, and needle counts are correct.  [name] [name]. [name] Brailsford,  MD, [facility]  General/ Trauma Surgery  06/25/2017 1:18 PM

## 2017-06-25 NOTE — Transfer of Care (Signed)
Immediate Anesthesia Transfer of Care Note  Patient: [name] [name]  Procedure(s) Performed: Procedure(s): LEFT MODIFIED RADICAL MASTECTOMY WITH AXILLARY LYMPH NODE DISSECTION ERAS PATHWAY (Left)  Patient Location: PACU  Anesthesia Type:General and Regional  Level of Consciousness: drowsy  Airway & Oxygen Therapy: Patient Spontanous Breathing and Patient connected to face mask oxygen  Post-op Assessment: Report given to RN and Post -op Vital signs reviewed and stable  Post vital signs: Reviewed and stable  Last Vitals:  Vitals:   06/25/17 1035 06/25/17 1040  BP: (!) 171/91 140/88  Pulse: 74 73  Resp: 17 16  Temp:    SpO2: 97% 96%    Last Pain:  Vitals:   06/25/17 0840  TempSrc: Oral      Patients Stated Pain Goal: 5 (20/94/70 9628)  Complications: No apparent anesthesia complications

## 2017-06-26 ENCOUNTER — Ambulatory Visit: Payer: Medicare Other | Admitting: Cardiovascular Disease

## 2017-06-26 ENCOUNTER — Encounter (HOSPITAL_COMMUNITY): Payer: Self-pay | Admitting: Surgery

## 2017-06-26 DIAGNOSIS — L8990 Pressure ulcer of unspecified site, unspecified stage: Secondary | ICD-10-CM | POA: Insufficient documentation

## 2017-06-26 DIAGNOSIS — C50912 Malignant neoplasm of unspecified site of left female breast: Secondary | ICD-10-CM | POA: Diagnosis not present

## 2017-06-26 LAB — BASIC METABOLIC PANEL
ANION GAP: 8 (ref 5–15)
BUN: 14 mg/dL (ref 6–20)
CALCIUM: 8.5 mg/dL — AB (ref 8.9–10.3)
CO2: 27 mmol/L (ref 22–32)
Chloride: 104 mmol/L (ref 101–111)
Creatinine, Ser: 0.74 mg/dL (ref 0.44–1.00)
GFR calc Af Amer: >60 mL/min (ref >60)
GFR calc non Af Amer: >60 mL/min (ref >60)
GLUCOSE: 131 mg/dL — AB (ref 65–99)
POTASSIUM: 4.2 mmol/L (ref 3.5–5.1)
Sodium: 139 mmol/L (ref 135–145)

## 2017-06-26 LAB — GLUCOSE, CAPILLARY
GLUCOSE-CAPILLARY: 128 mg/dL — AB (ref 65–99)
GLUCOSE-CAPILLARY: 161 mg/dL — AB (ref 65–99)
Glucose-Capillary: 154 mg/dL — ABNORMAL HIGH (ref 65–99)
Glucose-Capillary: 137 mg/dL — ABNORMAL HIGH (ref 65–99)

## 2017-06-26 LAB — CBC
HCT: 36 % (ref 36.0–46.0)
Hemoglobin: 11.5 g/dL — ABNORMAL LOW (ref 12.0–15.0)
MCH: 31.5 pg (ref 26.0–34.0)
MCHC: 31.9 g/dL (ref 30.0–36.0)
MCV: 98.6 fL (ref 78.0–100.0)
PLATELETS: 270 10*3/uL (ref 150–400)
RBC: 3.65 MIL/uL — AB (ref 3.87–5.11)
RDW: 14.9 % (ref 11.5–15.5)
WBC: 12.5 10*3/uL — AB (ref 4.0–10.5)

## 2017-06-26 MED ORDER — ZINC OXIDE 12.8 % EX OINT
TOPICAL_OINTMENT | CUTANEOUS | Status: DC | PRN
  Filled 2017-06-26: qty 56.7

## 2017-06-26 NOTE — Clinical Social Work Placement (Addendum)
   CLINICAL SOCIAL WORK PLACEMENT  NOTE  Date:  06/26/2017  Patient Details  Name: [name] [name] MRN: [id number] Date of Birth: Jun 27, 1946  Clinical Social Work is seeking post-discharge placement for this patient at the [place] level of care (*CSW will initial, date and re-position this form in  chart as items are completed):      Patient/family provided with [place] Work Department's list of facilities offering this level of care within the geographic area requested by the patient (or if unable, by the patient's family).      Patient/family informed of their freedom to choose among providers that offer the needed level of care, that participate in Medicare, Medicaid or managed care program needed by the patient, have an available bed and are willing to accept the patient.      Patient/family informed of [place] [facility]'s ownership interest in [facility] and [facility], as well as of the fact that they are under no obligation to receive care at these facilities.  PASRR submitted to EDS on       PASRR number received on 06/26/17     Existing PASRR number confirmed on       FL2 transmitted to all facilities in geographic area requested by pt/family on 06/26/17     FL2 transmitted to all facilities within larger geographic area on       Patient informed that his/her managed care company has contracts with or will negotiate with certain facilities, including the following:        Yes   Patient/family informed of bed offers received.  Patient chooses bed at [facility]     Physician recommends and patient chooses bed at      Patient to be transferred to [facility] on 06/27/17.  Patient to be transferred to facility by [facility]     Patient family notified on 06/27/17 of transfer.  Name of family member notified:  Elenore Rota     PHYSICIAN Please prepare priority discharge summary, including medications, Please prepare prescriptions, Please sign  FL2     Additional Comment:    _______________________________________________ Eileen Stanford, LCSW 06/26/2017, 10:41 AM

## 2017-06-26 NOTE — Consult Note (Signed)
[facility] Nurse wound consult note Reason for Consult: evaluate of skin breakdown found upon admission Patient from group home/SNF? With cognitive issues and cerebral palsy. She reports she wears an incontinent brief and is non ambulatory. She is incontinent of bowel and bladder. She reports "they won't give me a bag", clarification is that they will not place indwelling urinary catheter. I have explained risk from that. Feel that her skin breakdown is both from pressure from sitting/lying and use of incontinent briefs in which moisture makes the tissue more vunerable  Wound type: Stage 2 scattered open areas over the right and left buttock, one area open noted on the left buttock (2cm x 2cm x 0.3cm); 4-6 opened areas on the right buttock with the entire affected area -8cm x 3cm x 0.2cm. Blanchable redness over each buttock surrounding open ulcers. Superficial skin peeling consistent with MASD (moisture associated skin damage) Pressure Injury POA: Yes Measurement: see above  Wound bed: areas that are open are clean, pink, moist. No necrotic tissue present Drainage (amount, consistency, odor) none  Periwound: intact  Dressing procedure/placement/frequency: Ok to continue to use silicone foam to protect and insulate. Urinary incontinence is being managed with PureWick currently, however when she returns to SNF they will restart the use of incontinence briefs. For that reason I have ordered triple paste for use now if needed and to be used at the time of DC to protect the affected skin and ulcerations.  Discussed POC with patient and bedside nurse.  Re consult if needed, will not follow at this time. Thanks  Almeter Westhoff [employer], RN,CWOCN, CNS, Drain [phone])

## 2017-06-26 NOTE — Progress Notes (Signed)
1 Day Post-Op   Subjective/Chief Complaint: Sore No nausea Still wearing oxygen   Objective: Vital signs in last 24 hours: Temp:  [97.2 F (36.2 C)-98.5 F (36.9 C)] 98.4 F (36.9 C) (09/06 0532) Pulse Rate:  [77-98] 98 (09/06 0532) Resp:  [12-24] 19 (09/06 0532) BP: (109-169)/(65-101) 109/65 (09/06 0307) SpO2:  [92 %-97 %] 97 % (09/06 0911) Last BM Date: 06/25/17  Intake/Output from previous day: 09/05 0701 - 09/06 0700 In: 3315 [P.O.:600; I.V.:2715] Out: 945 [Urine:600; Drains:245; Blood:100] Intake/Output this shift: Total I/O In: 360 [P.O.:360] Out: -   General appearance: alert, cooperative and no distress Chest wall: left sided chest wall tenderness, flaps viable Drains with serosanguinous output  Lab Results:   Recent Labs  06/25/17 1633 06/26/17 0529  WBC 10.9* 12.5*  HGB 12.5 11.5*  HCT 39.3 36.0  PLT 250 270   BMET  Recent Labs  06/25/17 0849 06/25/17 1633 06/26/17 0529  NA 140  --  139  K 3.6  --  4.2  CL 99*  --  104  CO2 30  --  27  GLUCOSE 143*  --  131*  BUN 16  --  14  CREATININE 0.80 0.72 0.74  CALCIUM 9.5  --  8.5*   PT/INR No results for input(s): LABPROT, INR in the last 72 hours. ABG No results for input(s): PHART, HCO3 in the last 72 hours.  Invalid input(s): PCO2, PO2  Studies/Results: No results found.  Anti-infectives: Anti-infectives    Start     Dose/Rate Route Frequency Ordered Stop   06/25/17 1800  ceFAZolin (ANCEF) IVPB 2g/100 mL premix     2 g 200 mL/hr over 30 Minutes Intravenous Every 8 hours 06/25/17 1513 06/25/17 1749   06/25/17 0945  ceFAZolin (ANCEF) 3 g in dextrose 5 % 50 mL IVPB     3 g 130 mL/hr over 30 Minutes Intravenous On call to O.R. 06/24/17 1443 06/25/17 1113      Assessment/Plan: s/p Procedure(s): LEFT MODIFIED RADICAL MASTECTOMY WITH AXILLARY LYMPH NODE DISSECTION ERAS PATHWAY (Left) Advance diet Encourage IS OOB to chair Wean oxygen PO pain meds Hopefully home tomorrow.  LOS: 1  day    [name] [name] K. 06/26/2017

## 2017-06-26 NOTE — Progress Notes (Signed)
JPs leaking at site of insertion. Moderate amt of ss drainage noted on binder and bed, gauze dsg saturated. Dsg changed, will continue to monitor.

## 2017-06-26 NOTE — Clinical Social Work Note (Signed)
CSW spoke with RNCM. Pt is from [place] and will return at d/c.   [place], [place]

## 2017-06-26 NOTE — NC FL2 (Signed)
[place] LEVEL OF CARE SCREENING TOOL     IDENTIFICATION  Patient Name: [name] [name] Birthdate: July 07, 1946 Sex: female Admission Date (Current Location): 06/25/2017  [facility] and [place] Number:  Herbalist and Address:  The . [facility], [place] [address] [place], [place] [address]      Provider Number: [id number]  Attending Physician Name and Address:  Donnie Mesa, MD  Relative Name and Phone Number:       Current Level of Care: Hospital Recommended Level of Care: [place] Prior Approval Number:    Date Approved/Denied:   PASRR Number:    Discharge Plan: SNF    Current Diagnoses: Patient Active Problem List   Diagnosis Date Noted  . Pressure injury of skin 06/26/2017  . Invasive lobular carcinoma of breast, stage 2, left ([place]) 06/25/2017  . Malignant neoplasm of lower-outer quadrant of left breast of female, estrogen receptor positive ([place]) 06/13/2017  . Ulcer of thigh ([place]) 01/22/2016  . Chronic diastolic CHF (congestive heart failure) ([place]) 05/27/2014  . Cough 05/27/2014  . Essential hypertension 05/27/2014  . Ejection fraction   . Hypercholesterolemia   . Hypertensive heart disease   . GERD (gastroesophageal reflux disease)   . Osteoarthritis   . Gout   . Cerebral palsy ([place])   . DM type 2 with diabetic peripheral neuropathy ([place])   . PAD (peripheral artery disease) ([place])   . Glaucoma   . Neuropathy   . CKD (chronic kidney disease)     Orientation RESPIRATION BLADDER Height & Weight     Self, Situation, Time, Place  O2 (Nasal Cannula; 2L) Incontinent, External catheter (Placed 9/5) Weight: 208 lb (94.3 kg) Height:  5\' 6"  (167.6 cm)  BEHAVIORAL SYMPTOMS/MOOD NEUROLOGICAL BOWEL NUTRITION STATUS      Continent  (Please see d/c summary)  AMBULATORY STATUS COMMUNICATION OF NEEDS Skin   Limited Assist Verbally PU Stage and Appropriate Care (Closed incision, breast, guaze dressing)   PU Stage 2  Dressing:  (Left and right buttocks, foam dressing)                   Personal Care Assistance Level of Assistance  Bathing, Feeding, Dressing Bathing Assistance: Limited assistance Feeding assistance: Independent Dressing Assistance: Limited assistance     Functional Limitations Info  Hearing, Speech, Sight Sight Info: Adequate Hearing Info: Adequate Speech Info: Adequate    SPECIAL CARE FACTORS FREQUENCY  PT (By licensed PT), OT (By licensed OT)     PT Frequency: 3x OT Frequency: 3x            Contractures Contractures Info: Not present    Additional Factors Info  Code Status, Allergies Code Status Info: Full Code Allergies Info: Miconazole           Current Medications (06/26/2017):  This is the current hospital active medication list Current Facility-Administered Medications  Medication Dose Route Frequency Provider Last Rate Last Dose  . 0.9 %  sodium chloride infusion   Intravenous Continuous Donnie Mesa, MD 100 mL/hr at 06/26/17 0211    . acetaminophen (TYLENOL) tablet 1,000 mg  1,000 mg Oral Q6H Donnie Mesa, MD   1,000 mg at 06/26/17 0802  . allopurinol (ZYLOPRIM) tablet 300 mg  300 mg Oral Daily Donnie Mesa, MD   300 mg at 06/26/17 1006  . diphenhydrAMINE (BENADRYL) 12.5 MG/5ML elixir 12.5 mg  12.5 mg Oral Q6H PRN Donnie Mesa, MD       Or  .  diphenhydrAMINE (BENADRYL) injection 12.5 mg  12.5 mg Intravenous Q6H PRN Donnie Mesa, MD      . docusate sodium (COLACE) capsule 100 mg  100 mg Oral BID Donnie Mesa, MD   100 mg at 06/26/17 1005  . enoxaparin (LOVENOX) injection 40 mg  40 mg Subcutaneous Q24H Donnie Mesa, MD   40 mg at 06/26/17 0802  . escitalopram (LEXAPRO) tablet 10 mg  10 mg Oral Daily Donnie Mesa, MD   10 mg at 06/26/17 1006  . furosemide (LASIX) tablet 80 mg  80 mg Oral Daily Donnie Mesa, MD   80 mg at 06/26/17 1006  . gabapentin (NEURONTIN) capsule 300 mg  300 mg Oral TID Donnie Mesa, MD   300 mg at 06/26/17 1006   . guaiFENesin (MUCINEX) 12 hr tablet 600 mg  600 mg Oral BID Donnie Mesa, MD   600 mg at 06/26/17 1006  . insulin aspart (novoLOG) injection 0-15 Units  0-15 Units Subcutaneous TID WC Donnie Mesa, MD   2 Units at 06/26/17 0805  . ipratropium-albuterol (DUONEB) 0.5-2.5 (3) MG/3ML nebulizer solution 3 mL  3 mL Nebulization Q6H PRN Donnie Mesa, MD      . irbesartan (AVAPRO) tablet 75 mg  75 mg Oral Daily Donnie Mesa, MD   75 mg at 06/26/17 1005  . loratadine (CLARITIN) tablet 10 mg  10 mg Oral Daily Donnie Mesa, MD   10 mg at 06/26/17 1006  . metFORMIN (GLUCOPHAGE) tablet 500 mg  500 mg Oral BID Donnie Mesa, MD   500 mg at 06/26/17 1006  . methocarbamol (ROBAXIN) tablet 500 mg  500 mg Oral Q6H PRN Donnie Mesa, MD   500 mg at 06/26/17 1006  . metoprolol tartrate (LOPRESSOR) tablet 25 mg  25 mg Oral BID Donnie Mesa, MD   25 mg at 06/26/17 1006  . mometasone-formoterol (DULERA) 200-5 MCG/ACT inhaler 2 puff  2 puff Inhalation BID Donnie Mesa, MD   2 puff at 06/26/17 0910  . morphine 4 MG/ML injection 2-4 mg  2-4 mg Intravenous Q2H PRN Donnie Mesa, MD      . olopatadine (PATANOL) 0.1 % ophthalmic solution 1 drop  1 drop Both Eyes Daily Donnie Mesa, MD   1 drop at 06/26/17 1019  . ondansetron (ZOFRAN-ODT) disintegrating tablet 4 mg  4 mg Oral Q6H PRN Donnie Mesa, MD       Or  . ondansetron [facility]) injection 4 mg  4 mg Intravenous Q6H PRN Donnie Mesa, MD      . oxyCODONE (Oxy IR/ROXICODONE) immediate release tablet 5-10 mg  5-10 mg Oral Q4H PRN Donnie Mesa, MD   10 mg at 06/26/17 1006  . pantoprazole (PROTONIX) EC tablet 40 mg  40 mg Oral Daily Donnie Mesa, MD   40 mg at 06/26/17 1006  . potassium chloride SA (K-DUR,KLOR-CON) CR tablet 20 mEq  20 mEq Oral BID Donnie Mesa, MD   20 mEq at 06/26/17 1006  . simvastatin (ZOCOR) tablet 10 mg  10 mg Oral QHS Donnie Mesa, MD   10 mg at 06/25/17 2258     Discharge Medications: Please see discharge summary for  a list of discharge medications.  Relevant Imaging Results:  Relevant Lab Results:   Additional Information ssn:[id number]  [name] [name] Fuller, LCSW

## 2017-06-27 DIAGNOSIS — C50912 Malignant neoplasm of unspecified site of left female breast: Secondary | ICD-10-CM | POA: Diagnosis not present

## 2017-06-27 LAB — GLUCOSE, CAPILLARY
Glucose-Capillary: 106 mg/dL — ABNORMAL HIGH (ref 65–99)
Glucose-Capillary: 114 mg/dL — ABNORMAL HIGH (ref 65–99)

## 2017-06-27 MED ORDER — OXYCODONE-ACETAMINOPHEN 10-325 MG PO TABS: 1.0000 | ORAL_TABLET | Freq: Three times a day (TID) | ORAL | 0 refills | Status: DC | PRN

## 2017-06-27 NOTE — Care Management CC44 (Signed)
Condition Code 44 Documentation Completed  Patient Details  Name: [name] [name] MRN: [id number] Date of Birth: 06/03/1946   Condition Code 44 given:   yes Patient signature on Condition Code 44 notice:   yes Documentation of 2 MD's agreement:   yes Code 44 added to claim:   yes  Spoke with patient's brother [name] via phone [phone]  Marilu Favre, RN 06/27/2017, 8:52 AM

## 2017-06-27 NOTE — NC FL2 (Signed)
[place] LEVEL OF CARE SCREENING TOOL     IDENTIFICATION  Patient Name: [name] [name] Birthdate: Dec 19, 1945 Sex: female Admission Date (Current Location): 06/25/2017  [facility] and [place] Number:  Herbalist and Address:  The [place]. [facility], [place] [address] [place], [place] [address]      Provider Number: [id number]  Attending Physician Name and Address:  Donnie Mesa, MD  Relative Name and Phone Number:       Current Level of Care: Hospital Recommended Level of Care: [place] Prior Approval Number:    Date Approved/Denied:   PASRR Number: [id number]   Discharge Plan: SNF    Current Diagnoses: Patient Active Problem List   Diagnosis Date Noted  . Pressure injury of skin 06/26/2017  . Invasive lobular carcinoma of breast, stage 2, left ([place]) 06/25/2017  . Malignant neoplasm of lower-outer quadrant of left breast of female, estrogen receptor positive ([place]) 06/13/2017  . Ulcer of thigh ([place]) 01/22/2016  . Chronic diastolic CHF (congestive heart failure) ([place]) 05/27/2014  . Cough 05/27/2014  . Essential hypertension 05/27/2014  . Ejection fraction   . Hypercholesterolemia   . Hypertensive heart disease   . GERD (gastroesophageal reflux disease)   . Osteoarthritis   . Gout   . Cerebral palsy ([place])   . DM type 2 with diabetic peripheral neuropathy ([place])   . PAD (peripheral artery disease) ([place])   . Glaucoma   . Neuropathy   . CKD (chronic kidney disease)     Orientation RESPIRATION BLADDER Height & Weight     Self, Situation, Time, Place  O2 (Nasal Cannula; 2L) Incontinent, External catheter (Placed 9/5) Weight: 208 lb (94.3 kg) Height:  5\' 6"  (167.6 cm)  BEHAVIORAL SYMPTOMS/MOOD NEUROLOGICAL BOWEL NUTRITION STATUS      Continent  (Please see d/c summary)  AMBULATORY STATUS COMMUNICATION OF NEEDS Skin   Limited Assist Verbally PU Stage and Appropriate Care (Closed incision, breast, guaze dressing)    PU Stage 2 Dressing:  (Left and right buttocks, foam dressing)                   Personal Care Assistance Level of Assistance  Bathing, Feeding, Dressing Bathing Assistance: Limited assistance Feeding assistance: Independent Dressing Assistance: Limited assistance     Functional Limitations Info  Hearing, Speech, Sight Sight Info: Adequate Hearing Info: Adequate Speech Info: Adequate    SPECIAL CARE FACTORS FREQUENCY  PT (By licensed PT), OT (By licensed OT)     PT Frequency: 3x OT Frequency: 3x            Contractures Contractures Info: Not present    Additional Factors Info  Code Status, Allergies Code Status Info: Full Code Allergies Info: Miconazole           Current Medications (06/27/2017):  This is the current hospital active medication list Current Facility-Administered Medications  Medication Dose Route Frequency Provider Last Rate Last Dose  . 0.9 %  sodium chloride infusion   Intravenous Continuous Donnie Mesa, MD 100 mL/hr at 06/27/17 0600    . acetaminophen (TYLENOL) tablet 1,000 mg  1,000 mg Oral Q6H Donnie Mesa, MD   1,000 mg at 06/27/17 0540  . allopurinol (ZYLOPRIM) tablet 300 mg  300 mg Oral Daily Donnie Mesa, MD   300 mg at 06/26/17 1006  . diphenhydrAMINE (BENADRYL) 12.5 MG/5ML elixir 12.5 mg  12.5 mg Oral Q6H PRN Donnie Mesa, MD       Or  .  diphenhydrAMINE (BENADRYL) injection 12.5 mg  12.5 mg Intravenous Q6H PRN Donnie Mesa, MD      . docusate sodium (COLACE) capsule 100 mg  100 mg Oral BID Donnie Mesa, MD   100 mg at 06/26/17 2107  . enoxaparin (LOVENOX) injection 40 mg  40 mg Subcutaneous Q24H Donnie Mesa, MD   40 mg at 06/27/17 8115  . escitalopram (LEXAPRO) tablet 10 mg  10 mg Oral Daily Donnie Mesa, MD   10 mg at 06/26/17 1006  . furosemide (LASIX) tablet 80 mg  80 mg Oral Daily Donnie Mesa, MD   80 mg at 06/26/17 1006  . gabapentin (NEURONTIN) capsule 300 mg  300 mg Oral TID Donnie Mesa, MD   300 mg at  06/26/17 2107  . guaiFENesin (MUCINEX) 12 hr tablet 600 mg  600 mg Oral BID Donnie Mesa, MD   600 mg at 06/26/17 2107  . insulin aspart (novoLOG) injection 0-15 Units  0-15 Units Subcutaneous TID WC Donnie Mesa, MD   3 Units at 06/26/17 1720  . ipratropium-albuterol (DUONEB) 0.5-2.5 (3) MG/3ML nebulizer solution 3 mL  3 mL Nebulization Q6H PRN Donnie Mesa, MD      . irbesartan (AVAPRO) tablet 75 mg  75 mg Oral Daily Donnie Mesa, MD   75 mg at 06/26/17 1005  . loratadine (CLARITIN) tablet 10 mg  10 mg Oral Daily Donnie Mesa, MD   10 mg at 06/26/17 1006  . metFORMIN (GLUCOPHAGE) tablet 500 mg  500 mg Oral BID Donnie Mesa, MD   500 mg at 06/26/17 2107  . methocarbamol (ROBAXIN) tablet 500 mg  500 mg Oral Q6H PRN Donnie Mesa, MD   500 mg at 06/26/17 1006  . metoprolol tartrate (LOPRESSOR) tablet 25 mg  25 mg Oral BID Donnie Mesa, MD   25 mg at 06/26/17 2107  . mometasone-formoterol (DULERA) 200-5 MCG/ACT inhaler 2 puff  2 puff Inhalation BID Donnie Mesa, MD   2 puff at 06/26/17 0910  . morphine 4 MG/ML injection 2-4 mg  2-4 mg Intravenous Q2H PRN Donnie Mesa, MD      . olopatadine (PATANOL) 0.1 % ophthalmic solution 1 drop  1 drop Both Eyes Daily Donnie Mesa, MD   1 drop at 06/26/17 1019  . ondansetron (ZOFRAN-ODT) disintegrating tablet 4 mg  4 mg Oral Q6H PRN Donnie Mesa, MD       Or  . ondansetron [facility]) injection 4 mg  4 mg Intravenous Q6H PRN Donnie Mesa, MD      . oxyCODONE (Oxy IR/ROXICODONE) immediate release tablet 5-10 mg  5-10 mg Oral Q4H PRN Donnie Mesa, MD   10 mg at 06/26/17 1006  . pantoprazole (PROTONIX) EC tablet 40 mg  40 mg Oral Daily Donnie Mesa, MD   40 mg at 06/26/17 1006  . potassium chloride SA (K-DUR,KLOR-CON) CR tablet 20 mEq  20 mEq Oral BID Donnie Mesa, MD   20 mEq at 06/26/17 2107  . simvastatin (ZOCOR) tablet 10 mg  10 mg Oral QHS Donnie Mesa, MD   10 mg at 06/26/17 2107  . Zinc Oxide (TRIPLE PASTE) 12.8 % ointment    Topical PRN Donnie Mesa, MD         Discharge Medications: Please see discharge summary for a list of discharge medications.  Relevant Imaging Results:  Relevant Lab Results:   Additional Information ssn:[id number]  [name] Vasti Yagi, LCSW

## 2017-06-27 NOTE — Progress Notes (Signed)
Pt scheduled for pickup by [facility] this pm. Went over discharge instructions with no concerns voiced. Report called and given to [place] at [facility]

## 2017-06-27 NOTE — Discharge Summary (Signed)
Physician Discharge Summary  Patient ID: [name] [name] MRN: [id number] DOB/AGE: 11-02-1945 71 y.o.  Admit date: 06/25/2017 Discharge date: 06/27/2017  Admission Diagnoses:  Invasive lobular carcinoma - left breast with positive axillary lymph nodes and skin involvement  Discharge Diagnoses: same Active Problems:   Invasive lobular carcinoma of breast, stage 2, left (HCC)   Pressure injury of skin   Discharged Condition: good  Hospital Course: 71 yo female with CP/ cognitive deficits, nursing home resident presents with large left breast cancer with skin involvement and positive axillary lymph nodes.  She underwent left modified radical mastectomy with axillary lymph node dissection.  She was also noted to have pressure ulcers on her buttocks upon arrival at the hospital.   She tolerated the surgery well.  She weaned off oxygen on POD#1.  Her pain seems to be adequately controlled.  She is tolerating a diet.   Consults: wound care   Treatments: surgery: L MRM  Discharge Exam: Blood pressure 108/64, pulse 74, temperature 98.7 F (37.1 C), temperature source Axillary, resp. rate 18, height 5\' 6"  (1.676 m), weight 94.3 kg (208 lb), SpO2 98 %. General appearance: alert, cooperative and no distress Left chest - viable flaps with intact staple line; drainage serosanguinous from two drains  Disposition: 01-Home or Self Care  Discharge Instructions    Call MD for:  persistant nausea and vomiting    Complete by:  As directed    Call MD for:  redness, tenderness, or signs of infection (pain, swelling, redness, odor or green/yellow discharge around incision site)    Complete by:  As directed    Call MD for:  severe uncontrolled pain    Complete by:  As directed    Call MD for:  temperature >100.4    Complete by:  As directed    Diet general    Complete by:  As directed    Driving Restrictions    Complete by:  As directed    Do not drive while taking pain medications   Increase activity  slowly    Complete by:  As directed    May shower / Bathe    Complete by:  As directed      Allergies as of 06/27/2017      Reactions   Miconazole    Documented on MAR      Medication List    TAKE these medications   acetaminophen 325 MG tablet Commonly known as:  TYLENOL Take 325 mg by mouth every 6 (six) hours as needed for moderate pain. Do not exceed 4 gms in 24 hours   ADVAIR DISKUS 250-50 MCG/DOSE Aepb Generic drug:  Fluticasone-Salmeterol Inhale 1 puff into the lungs 2 (two) times daily.   allopurinol 300 MG tablet Commonly known as:  ZYLOPRIM Take 300 mg by mouth daily.   CALTRATE 600 PO Take 600 mg by mouth 2 (two) times daily.   CERTAGEN PO Take 1 tablet by mouth daily.   Cholecalciferol 50000 units Tabs Take 50,000 Units by mouth every 30 (thirty) days. On the 17th for osteoporosis   cyclobenzaprine 10 MG tablet Commonly known as:  FLEXERIL Take 10 mg by mouth 3 (three) times daily.   docusate sodium 100 MG capsule Commonly known as:  COLACE Take 100 mg by mouth 2 (two) times daily.   escitalopram 10 MG tablet Commonly known as:  LEXAPRO Take 10 mg by mouth daily.   esomeprazole 40 MG capsule Commonly known as:  NEXIUM Take 40 mg by  mouth daily at 12 noon.   famotidine 20 MG tablet Commonly known as:  PEPCID Take 20 mg by mouth 2 (two) times daily.   feeding supplement (PRO-STAT SUGAR FREE 64) Liqd Take 30 mLs by mouth 2 (two) times daily.   ferrous sulfate 325 (65 FE) MG tablet Take 325 mg by mouth daily with breakfast.   furosemide 80 MG tablet Commonly known as:  LASIX Take 80 mg by mouth daily.   gabapentin 300 MG capsule Commonly known as:  NEURONTIN Take 300 mg by mouth 3 (three) times daily.   guaiFENesin 600 MG 12 hr tablet Commonly known as:  MUCINEX Take 600 mg by mouth 2 (two) times daily.   ipratropium-albuterol 0.5-2.5 (3) MG/3ML Soln Commonly known as:  DUONEB Take 3 mLs by nebulization every 6 (six) hours as needed  (cough, congestion, and wheezing).   loratadine 10 MG tablet Commonly known as:  CLARITIN Take 10 mg by mouth daily.   metFORMIN 500 MG tablet Commonly known as:  GLUCOPHAGE Take 500 mg by mouth 2 (two) times daily.   metoprolol tartrate 25 MG tablet Commonly known as:  LOPRESSOR Take 25 mg by mouth 2 (two) times daily.   olopatadine 0.1 % ophthalmic solution Commonly known as:  PATANOL Place 1 drop into both eyes daily.   ondansetron 4 MG tablet Commonly known as:  ZOFRAN Take 4 mg by mouth every 6 (six) hours as needed for nausea or vomiting.   oxyCODONE-acetaminophen 10-325 MG tablet Commonly known as:  PERCOCET Take one tablet by mouth three times daily for pain. Do not exceed 4gm of Tylenol in 24 hours What changed:  Another medication with the same name was added. Make sure you understand how and when to take each.   oxyCODONE-acetaminophen 10-325 MG tablet Commonly known as:  PERCOCET Take 1 tablet by mouth every 8 (eight) hours as needed for pain. What changed:  You were already taking a medication with the same name, and this prescription was added. Make sure you understand how and when to take each.   polyethylene glycol packet Commonly known as:  MIRALAX / GLYCOLAX Take 17 g by mouth daily.   potassium chloride SA 20 MEQ tablet Commonly known as:  K-DUR,KLOR-CON Take 20 mEq by mouth 2 (two) times daily.   sennosides-docusate sodium 8.6-50 MG tablet Commonly known as:  SENOKOT-S Take 1 tablet by mouth at bedtime.   simvastatin 10 MG tablet Commonly known as:  ZOCOR Take 10 mg by mouth at bedtime.   SYSTANE BALANCE OP Apply 1 drop to eye 2 (two) times daily.   valsartan 40 MG tablet Commonly known as:  DIOVAN Take 1 tablet (40 mg total) by mouth daily.   vitamin C 500 MG tablet Commonly known as:  ASCORBIC ACID Take 500 mg by mouth 2 (two) times daily.   zinc oxide 11.3 % Crea cream Commonly known as:  BALMEX Apply 1 application topically daily as  needed. Apply to buttocks            Discharge Care Instructions        Start     Ordered   06/27/17 0000  Diet general     06/27/17 0805   06/27/17 0000  Increase activity slowly     06/27/17 0805   06/27/17 0000  May shower / Bathe     06/27/17 0805   06/27/17 0000  Driving Restrictions    Comments:  Do not drive while taking pain medications  06/27/17 0805   06/27/17 0000  Call MD for:  temperature >100.4     06/27/17 0805   06/27/17 0000  Call MD for:  persistant nausea and vomiting     06/27/17 0805   06/27/17 0000  Call MD for:  severe uncontrolled pain     06/27/17 0805   06/27/17 0000  Call MD for:  redness, tenderness, or signs of infection (pain, swelling, redness, odor or green/yellow discharge around incision site)     06/27/17 0805   06/27/17 0000  oxyCODONE-acetaminophen (PERCOCET) 10-325 MG tablet  Every 8 hours PRN     06/27/17 0805     Follow-up Information    Donnie Mesa, MD Follow up in 1 week(s).   Specialty:  General Surgery Contact information: [place] [address] [phone]           Signed: Maia Petties. 06/27/2017, 8:05 AM

## 2017-06-27 NOTE — Clinical Social Work Note (Signed)
Clinical Social Work Assessment  Patient Details  Name: [name] [name] MRN: [id number] Date of Birth: 1946-06-01  Date of referral:  06/27/17               Reason for consult:  Facility Placement                Permission sought to share information with:  Family Supports Permission granted to share information::  Yes, Verbal Permission Granted  Name::     English as a second language teacher::     Relationship::  Brother  Sport and exercise psychologist Information:     Housing/Transportation Living arrangements for the past 2 months:  [place] of Information:  Patient Patient Interpreter Needed:  None Criminal Activity/Legal Involvement Pertinent to Current Situation/Hospitalization:  No - Comment as needed Significant Relationships:  Siblings Lives with:  Facility Resident Do you feel safe going back to the place where you live?  Yes Need for family participation in patient care:     Care giving concerns:  No family or friends present at bedside during initial assessment. Pt gave CSW permission to contact brother if needed.   Social Worker assessment / plan:  CSW spoke with pt at bedside. Pt confirmed she is from [place] and is there for long term care. Pt agreeable to return at d/c. Facility confirmed they will take her back at d/c. Pt denies any concerned regarding placement at this time.  Employment status:    Insurance informationEducational psychologist PT Recommendations:  [place] / Referral to community resources:  [place]  Patient/Family's Response to care:  Pt verbalized understanding of CSW role and expressed appreciation for support. Pt denies any concern regarding pt care at this time.   Patient/Family's Understanding of and Emotional Response to Diagnosis, Current Treatment, and Prognosis:  Pt understanding and realistic regarding physical limitations. Pt understands the need for SNF placement at d/c. Pt agreeable to SNF placement at d/c, at  this time. Pt's responses emotionally appropriate during conversation with CSW. Pt denies any concern regarding treatment plan at this time. CSW will continue to provide support and facilitate d/c needs.   Emotional Assessment Appearance:  Appears stated age Attitude/Demeanor/Rapport:   (Patient was appropriate.) Affect (typically observed):  Accepting, Appropriate, Calm Orientation:  Oriented to Self, Oriented to Place, Oriented to  Time, Oriented to Situation Alcohol / Substance use:  Not Applicable Psych involvement (Current and /or in the community):  No (Comment)  Discharge Needs  Concerns to be addressed:  No discharge needs identified Readmission within the last 30 days:  No Current discharge risk:  Dependent with Mobility Barriers to Discharge:  No Barriers Identified   Harace Mccluney A Cydney Alvarenga, LCSW 06/27/2017, 9:39 AM

## 2017-06-27 NOTE — Discharge Instructions (Signed)
[facility]___[facility] °[phone] ° °MASTECTOMY: POST OP INSTRUCTIONS ° °Always review your discharge instruction sheet given to you by the facility where your surgery was performed. °IF YOU HAVE DISABILITY OR FAMILY LEAVE FORMS, YOU MUST BRING THEM TO THE OFFICE FOR PROCESSING.   °DO NOT GIVE THEM TO YOUR DOCTOR. °A prescription for pain medication may be given to you upon discharge.  Take your pain medication as prescribed, if needed.  If narcotic pain medicine is not needed, then you may take acetaminophen (Tylenol) or ibuprofen (Advil) as needed. °1. Take your usually prescribed medications unless otherwise directed. °2. If you need a refill on your pain medication, please contact your pharmacy.  They will contact our office to request authorization.  Prescriptions will not be filled after 5pm or on week-ends. °3. You should follow a light diet the first few days after arrival home, such as soup and crackers, etc.  Resume your normal diet the day after surgery. °4. Most patients will experience some swelling and bruising on the chest and underarm.  Ice packs will help.  Swelling and bruising can take several days to resolve.  °5. It is common to experience some constipation if taking pain medication after surgery.  Increasing fluid intake and taking a stool softener (such as Colace) will usually help or prevent this problem from occurring.  A mild laxative (Milk of Magnesia or Miralax) should be taken according to package instructions if there are no bowel movements after 48 hours. °6. Unless discharge instructions indicate otherwise, leave your bandage dry and in place until your next appointment in 3-5 days.  You may take a limited sponge bath.  No tube baths or showers until the drains are removed.  You may have steri-strips (small skin tapes) in place directly over the incision.  These strips should be left on the skin for 7-10 days.  If your surgeon used skin glue on the incision, you may  shower in 24 hours.  The glue will flake off over the next 2-3 weeks.  Any sutures or staples will be removed at the office during your follow-up visit. °7. DRAINS:  If you have drains in place, it is important to keep a list of the amount of drainage produced each day in your drains.  Before leaving the hospital, you should be instructed on drain care.  Call our office if you have any questions about your drains. °8. ACTIVITIES:  You may resume regular (light) daily activities beginning the next day--such as daily self-care, walking, climbing stairs--gradually increasing activities as tolerated.  You may have sexual intercourse when it is comfortable.  Refrain from any heavy lifting or straining until approved by your doctor. °a. You may drive when you are no longer taking prescription pain medication, you can comfortably wear a seatbelt, and you can safely maneuver your car and apply brakes. °b. RETURN TO WORK:  __________________________________________________________ °9. You should see your doctor in the office for a follow-up appointment approximately 3-5 days after your surgery.  Your doctor’s nurse will typically make your follow-up appointment when she calls you with your pathology report.  Expect your pathology report 2-3 business days after your surgery.  You may call to check if you do not hear from us after three days.   °10. OTHER INSTRUCTIONS: ______________________________________________________________________________________________ ____________________________________________________________________________________________ °WHEN TO CALL YOUR DOCTOR: °1. Fever over 101.0 °2. Nausea and/or vomiting °3. Extreme swelling or bruising °4. Continued bleeding from incision. °5. Increased pain, redness, or drainage from the incision. °  The clinic staff is available to answer your questions during regular business hours.  Please don’t hesitate to call and ask to speak to one of the nurses for clinical  concerns.  If you have a medical emergency, go to the nearest emergency room or call 911.  A surgeon from [facility] is always on call at the hospital. °[address], [address], [place], [place]  [address] ? [address] [address], [place], [place]   [address] °[phone] ? [phone] ? FAX [phone] ° °

## 2017-06-27 NOTE — Clinical Social Work Note (Signed)
Facility will have pt's bed ready at 1:00. PTAR has been set up for 1:00. RN please call report closer to 1:00.  Clinical Social Worker facilitated patient discharge including contacting patient family and facility to confirm patient discharge plans.  Clinical information faxed to facility and family agreeable with plan.  CSW arranged ambulance transport via [facility] to [place] .  RN to call [phone] for report  prior to discharge.  Clinical Social Worker will sign off for now as social work intervention is no longer needed. Please consult [place] again if new need arises.  [place], [place]

## 2017-06-27 NOTE — Progress Notes (Signed)
Pt picked up by ptar this afternoon for transportation to [facility]. Condition stable on discharge

## 2017-07-07 ENCOUNTER — Ambulatory Visit (HOSPITAL_BASED_OUTPATIENT_CLINIC_OR_DEPARTMENT_OTHER): Payer: Medicare Other | Admitting: Hematology and Oncology

## 2017-07-07 ENCOUNTER — Encounter: Payer: Self-pay | Admitting: Hematology and Oncology

## 2017-07-07 ENCOUNTER — Telehealth: Payer: Self-pay | Admitting: Hematology and Oncology

## 2017-07-07 ENCOUNTER — Other Ambulatory Visit: Payer: Self-pay | Admitting: *Deleted

## 2017-07-07 DIAGNOSIS — Z170 Estrogen receptor positive status [ER+]: Secondary | ICD-10-CM | POA: Diagnosis not present

## 2017-07-07 DIAGNOSIS — C50512 Malignant neoplasm of lower-outer quadrant of left female breast: Secondary | ICD-10-CM

## 2017-07-07 DIAGNOSIS — C773 Secondary and unspecified malignant neoplasm of axilla and upper limb lymph nodes: Secondary | ICD-10-CM

## 2017-07-07 NOTE — Progress Notes (Signed)
Referral made to XRT.

## 2017-07-07 NOTE — Telephone Encounter (Signed)
Per 9/17 no los °

## 2017-07-07 NOTE — Assessment & Plan Note (Signed)
06/25/2017: Left breast modified radical mastectomy: Invasive lobular carcinoma, 10.5 cm, grade 2, tumor involves skin and nipple, 12/13 lymph nodes positive, involves skeletal muscle and adipose tissue, ER 95%, PR 95%, HER-2 negative, Ki-67 25% to 40% T4b N3a stage IIIB AJCC 8  Pathology counseling: I discussed the final pathology report of the patient provided  a copy of this report. I discussed the margins as well as lymph node surgeries. We also discussed the final staging along with previously performed ER/PR and HER-2/neu testing.  Recommendation: 1. Ideally patient needs chemotherapy but she is in no shape to receive chemotherapy. Her performance status is poor and she has many comorbidities that makes chemotherapy not an option. 2. adjuvant radiation therapy followed by 3. Adjuvant antiestrogen therapy  Return to clinic at the end of radiation to begin antiestrogen therapy

## 2017-07-07 NOTE — Progress Notes (Signed)
Patient Care Team: Patient, No Pcp Per as PCP - General (General Practice)  DIAGNOSIS:  Encounter Diagnosis  Name Primary?  . Malignant neoplasm of lower-outer quadrant of left breast of female, estrogen receptor positive ([place])     SUMMARY OF ONCOLOGIC HISTORY:   Malignant neoplasm of lower-outer quadrant of left breast of female, estrogen receptor positive ([place])   05/08/2017 Initial Diagnosis    Left breast biopsy 3:30 position: Invasive lobular carcinoma ER 95%, PR 95%, Ki-67 25%, HER-2 negative ratio 0.97, grade 2; lymph node biopsy positive for metastatic cancer, ER 95%, PR 95%, Ki-67 40%, HER-2 negative ratio 1.2; left breast mass 7 irregular 9 cm with nipple retraction inflammatory breast cancer, at least 3 enlarged abnormal lymph nodes, T4d N1 stage IIIa (AJCC 8)      06/25/2017 Surgery    Left breast modified radical mastectomy: Invasive lobular carcinoma, 10.5 cm, grade 2, tumor involves skin and nipple, 12/13 lymph nodes positive, involves skeletal muscle and adipose tissue, ER 95%, PR 95%, HER-2 negative, Ki-67 25% to 40% T4b N3a stage IIIB AJCC 8       CHIEF COMPLIANT: Follow-up after recent surgery  INTERVAL HISTORY: JAHNAE MCADOO is a 71 year old with above-mentioned history left breast cancer who underwent left mastectomy with axillary lymph node dissection and is here today to discuss pathology report. She lives in a nursing home and is here with a caregiver. She appears to be doing quite well from pain standpoint. Unfortunately the final surgical excision revealed that she had 12 positive lymph nodes. The primary tumor was also very large at 10.5 cm involving the skin.  REVIEW OF SYSTEMS:   Constitutional: Denies fevers, chills or abnormal weight loss Eyes: Denies blurriness of vision Ears, nose, mouth, throat, and face: Denies mucositis or sore throat Respiratory: Denies cough, dyspnea or wheezes Cardiovascular: Denies palpitation, chest discomfort Gastrointestinal:   Denies nausea, heartburn or change in bowel habits Skin: Denies abnormal skin rashes Lymphatics: Denies new lymphadenopathy or easy bruising Neurological: Generalized weakness, speech impairment, chronic mental health issues Behavioral/Psych: Mood is stable, no new changes  Extremities: No lower extremity edema Breast: Status post left mastectomy All other systems were reviewed with the patient and are negative.  I have reviewed the past medical history, past surgical history, social history and family history with the patient and they are unchanged from previous note.  ALLERGIES:  is allergic to miconazole.  MEDICATIONS:  Current Outpatient Prescriptions  Medication Sig Dispense Refill  . acetaminophen (TYLENOL) 325 MG tablet Take 325 mg by mouth every 6 (six) hours as needed for moderate pain. Do not exceed 4 gms in 24 hours    . ADVAIR DISKUS 250-50 MCG/DOSE AEPB Inhale 1 puff into the lungs 2 (two) times daily.  0  . allopurinol (ZYLOPRIM) 300 MG tablet Take 300 mg by mouth daily.    . Amino Acids-Protein Hydrolys (FEEDING SUPPLEMENT, PRO-STAT SUGAR FREE 64,) LIQD Take 30 mLs by mouth 2 (two) times daily.    . Calcium Carbonate (CALTRATE 600 PO) Take 600 mg by mouth 2 (two) times daily.    . Cholecalciferol 50000 units TABS Take 50,000 Units by mouth every 30 (thirty) days. On the 17th for osteoporosis    . cyclobenzaprine (FLEXERIL) 10 MG tablet Take 10 mg by mouth 3 (three) times daily.     Marland Kitchen docusate sodium (COLACE) 100 MG capsule Take 100 mg by mouth 2 (two) times daily.    Marland Kitchen escitalopram (LEXAPRO) 10 MG tablet Take 10 mg  by mouth daily.    Marland Kitchen esomeprazole (NEXIUM) 40 MG capsule Take 40 mg by mouth daily at 12 noon.    . famotidine (PEPCID) 20 MG tablet Take 20 mg by mouth 2 (two) times daily.    . ferrous sulfate 325 (65 FE) MG tablet Take 325 mg by mouth daily with breakfast.    . furosemide (LASIX) 80 MG tablet Take 80 mg by mouth daily.    Marland Kitchen gabapentin (NEURONTIN) 300 MG  capsule Take 300 mg by mouth 3 (three) times daily.    Marland Kitchen guaiFENesin (MUCINEX) 600 MG 12 hr tablet Take 600 mg by mouth 2 (two) times daily.    Marland Kitchen ipratropium-albuterol (DUONEB) 0.5-2.5 (3) MG/3ML SOLN Take 3 mLs by nebulization every 6 (six) hours as needed (cough, congestion, and wheezing).    Marland Kitchen loratadine (CLARITIN) 10 MG tablet Take 10 mg by mouth daily.    . metFORMIN (GLUCOPHAGE) 500 MG tablet Take 500 mg by mouth 2 (two) times daily.     . metoprolol tartrate (LOPRESSOR) 25 MG tablet Take 25 mg by mouth 2 (two) times daily.    . Multiple Vitamins-Minerals (CERTAGEN PO) Take 1 tablet by mouth daily.    Marland Kitchen olopatadine (PATANOL) 0.1 % ophthalmic solution Place 1 drop into both eyes daily.     . ondansetron (ZOFRAN) 4 MG tablet Take 4 mg by mouth every 6 (six) hours as needed for nausea or vomiting.    Marland Kitchen oxyCODONE-acetaminophen (PERCOCET) 10-325 MG tablet Take one tablet by mouth three times daily for pain. Do not exceed 4gm of Tylenol in 24 hours 90 tablet 0  . oxyCODONE-acetaminophen (PERCOCET) 10-325 MG tablet Take 1 tablet by mouth every 8 (eight) hours as needed for pain. 20 tablet 0  . polyethylene glycol (MIRALAX / GLYCOLAX) packet Take 17 g by mouth daily.    . potassium chloride SA (K-DUR,KLOR-CON) 20 MEQ tablet Take 20 mEq by mouth 2 (two) times daily.    Marland Kitchen Propylene Glycol (SYSTANE BALANCE OP) Apply 1 drop to eye 2 (two) times daily.    . sennosides-docusate sodium (SENOKOT-S) 8.6-50 MG tablet Take 1 tablet by mouth at bedtime.    . simvastatin (ZOCOR) 10 MG tablet Take 10 mg by mouth at bedtime.    . valsartan (DIOVAN) 40 MG tablet Take 1 tablet (40 mg total) by mouth daily. 30 tablet 6  . vitamin C (ASCORBIC ACID) 500 MG tablet Take 500 mg by mouth 2 (two) times daily.    Marland Kitchen zinc oxide (BALMEX) 11.3 % CREA cream Apply 1 application topically daily as needed. Apply to buttocks     No current facility-administered medications for this visit.     PHYSICAL EXAMINATION: ECOG  PERFORMANCE STATUS: 2 - Symptomatic, <50% confined to bed  Vitals:   07/07/17 1056  BP: (!) 103/57  Pulse: 79  Resp: 17  Temp: 97.8 F (36.6 C)  SpO2: 96%   Filed Weights    GENERAL:alert, no distress and comfortable SKIN: skin color, texture, turgor are normal, no rashes or significant lesions EYES: normal, Conjunctiva are pink and non-injected, sclera clear OROPHARYNX:no exudate, no erythema and lips, buccal mucosa, and tongue normal  NECK: supple, thyroid normal size, non-tender, without nodularity LYMPH:  no palpable lymphadenopathy in the cervical, axillary or inguinal LUNGS: clear to auscultation and percussion with normal breathing effort HEART: regular rate & rhythm and no murmurs and no lower extremity edema ABDOMEN:abdomen soft, non-tender and normal bowel sounds MUSCULOSKELETAL:no cyanosis of digits and no clubbing  NEURO: Speech issues, mental health issues EXTREMITIES: No lower extremity edema   LABORATORY DATA:  I have reviewed the data as listed   Chemistry      Component Value Date/Time   NA 139 06/26/2017 0529   K 4.2 06/26/2017 0529   CL 104 06/26/2017 0529   CO2 27 06/26/2017 0529   BUN 14 06/26/2017 0529   CREATININE 0.74 06/26/2017 0529      Component Value Date/Time   CALCIUM 8.5 (L) 06/26/2017 0529   ALKPHOS 73 01/05/2014 1720   AST 23 01/05/2014 1720   ALT 20 01/05/2014 1720   BILITOT 0.3 01/05/2014 1720       Lab Results  Component Value Date   WBC 12.5 (H) 06/26/2017   HGB 11.5 (L) 06/26/2017   HCT 36.0 06/26/2017   MCV 98.6 06/26/2017   PLT 270 06/26/2017   NEUTROABS 4.6 01/05/2014    ASSESSMENT & PLAN:  Malignant neoplasm of lower-outer quadrant of left breast of female, estrogen receptor positive ([place]) 06/25/2017: Left breast modified radical mastectomy: Invasive lobular carcinoma, 10.5 cm, grade 2, tumor involves skin and nipple, 12/13 lymph nodes positive, involves skeletal muscle and adipose tissue, ER 95%, PR 95%, HER-2  negative, Ki-67 25% to 40% T4b N3a stage IIIB AJCC 8  Pathology counseling: I discussed the final pathology report of the patient provided  a copy of this report. I discussed the margins as well as lymph node surgeries. We also discussed the final staging along with previously performed ER/PR and HER-2/neu testing.  Recommendation: 1. Ideally patient needs chemotherapy but she is in no shape to receive chemotherapy. Her performance status is poor and she has many comorbidities that makes chemotherapy not an option. 2. adjuvant radiation therapy followed by 3. Adjuvant antiestrogen therapy  Return to clinic at the end of radiation to begin antiestrogen therapy     I spent 25 minutes talking to the patient of which more than half was spent in counseling and coordination of care.  No orders of the defined types were placed in this encounter.  The patient has a good understanding of the overall plan. she agrees with it. she will call with any problems that may develop before the next visit here.   Rulon Eisenmenger, MD 07/07/17

## 2017-07-09 ENCOUNTER — Encounter: Payer: Self-pay | Admitting: Radiation Oncology

## 2017-07-09 NOTE — Progress Notes (Signed)
Location of Breast Cancer: Left Breast  Histology per Pathology Report:  05/08/17 Diagnosis 1. 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 2. Lymph node, needle/core biopsy, left axilla - MAMMARY CARCINOMA INVOLVING FIBROADIPOSE TISSUE.  Receptor Status: ER(95%), PR (95%), Her2-neu (NEG), Ki-(25%)  06/25/17 Diagnosis 1. Breast, modified radical mastectomy , Left and left axillary nodes - INVASIVE LOBULAR CARCINOMA, 10.5 CM, MSBR GRADE II. - TUMOR INVOLVES DERMIS OF SKIN AND NIPPLE. - MARGINS NOT INVOLVED. - METASTATIC CARCINOMA IN TWELVE OF THIRTEEN LYMPH NODES (12/13). 2. Fatty tissue, Left Chest wall - INVASIVE LOBULAR CARCINOMA INVOLVING SKELETAL MUSCLE AND ADIPOSE TISSUE.  Did patient present with symptoms or was this found on screening mammography?: Dr. Gershon Crane documents on 05/16/17: This summer, she was noted to have left nipple retraction and developed a large central breast mass with peau d'orange appearance of the skin. She underwent evaluation with mammogram, ultrasound, and biopsy. 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   Past/Anticipated interventions by surgeon, if any: 06/25/17 Procedure performed: Left modified radical mastectomy with axillary lymph node dissection Surgeon:TSUEI,MATTHEW K.  Past/Anticipated interventions by medical oncology, if any:  07/07/17 Recommendation: 1. Ideally patient needs chemotherapy but she is in no shape to receive chemotherapy. Her performance status is poor and she has many comorbidities that makes chemotherapy not an option. 2. adjuvant radiation therapy followed by 3. Adjuvant antiestrogen therapy  Return to clinic at the end of radiation to begin antiestrogen therapy    Lymphedema issues, if any:  She has some edema to her Left upper arm. She has decreased arm mobility. She is unable to raise her arm  above her shoulder.   Pain issues, if any:  She denies  SAFETY ISSUES:  Prior radiation? No  Pacemaker/ICD? No  Possible current pregnancy? No  Is the patient on methotrexate? No  Current Complaints / other details:   This is a 71 yo female with cerebral palsy and cognitive deficits who is a nursing home resident. She is wheelchair bound due to osteoarthritis and CHF. She is diabetic. She is not accompanied by anyone today. She reports to me that her brother, who is listed as her guardian of her estate on paper work that she had with her is unable to be with her due to physical problems and memory loss.   BP 121/65   Pulse 78   Temp 98.3 F (36.8 C)   SpO2 94% Comment: room air    Leyda Vanderwerf, Stephani Police, RN 07/09/2017,3:18 PM

## 2017-07-15 ENCOUNTER — Ambulatory Visit (HOSPITAL_COMMUNITY): Payer: Medicare Other

## 2017-07-16 ENCOUNTER — Ambulatory Visit
Admission: RE | Admit: 2017-07-16 | Discharge: 2017-07-16 | Disposition: A | Discharge status: Home / Self Care | Payer: Medicare Other | Source: Ambulatory Visit | Attending: Radiation Oncology | Admitting: Radiation Oncology

## 2017-07-16 ENCOUNTER — Encounter: Payer: Self-pay | Admitting: Radiation Oncology

## 2017-07-16 DIAGNOSIS — H409 Unspecified glaucoma: Secondary | ICD-10-CM | POA: Diagnosis not present

## 2017-07-16 DIAGNOSIS — Z7984 Long term (current) use of oral hypoglycemic drugs: Secondary | ICD-10-CM | POA: Insufficient documentation

## 2017-07-16 DIAGNOSIS — M109 Gout, unspecified: Secondary | ICD-10-CM | POA: Insufficient documentation

## 2017-07-16 DIAGNOSIS — M1990 Unspecified osteoarthritis, unspecified site: Secondary | ICD-10-CM | POA: Insufficient documentation

## 2017-07-16 DIAGNOSIS — E1142 Type 2 diabetes mellitus with diabetic polyneuropathy: Secondary | ICD-10-CM | POA: Insufficient documentation

## 2017-07-16 DIAGNOSIS — Z87891 Personal history of nicotine dependence: Secondary | ICD-10-CM | POA: Diagnosis not present

## 2017-07-16 DIAGNOSIS — I5032 Chronic diastolic (congestive) heart failure: Secondary | ICD-10-CM | POA: Diagnosis not present

## 2017-07-16 DIAGNOSIS — Z170 Estrogen receptor positive status [ER+]: Secondary | ICD-10-CM | POA: Diagnosis not present

## 2017-07-16 DIAGNOSIS — C50512 Malignant neoplasm of lower-outer quadrant of left female breast: Secondary | ICD-10-CM | POA: Diagnosis present

## 2017-07-16 DIAGNOSIS — G809 Cerebral palsy, unspecified: Secondary | ICD-10-CM | POA: Insufficient documentation

## 2017-07-16 DIAGNOSIS — R4189 Other symptoms and signs involving cognitive functions and awareness: Secondary | ICD-10-CM | POA: Diagnosis not present

## 2017-07-16 DIAGNOSIS — F329 Major depressive disorder, single episode, unspecified: Secondary | ICD-10-CM | POA: Diagnosis not present

## 2017-07-16 DIAGNOSIS — Z510 Encounter for antineoplastic radiation therapy: Secondary | ICD-10-CM | POA: Insufficient documentation

## 2017-07-16 DIAGNOSIS — I890 Lymphedema, not elsewhere classified: Secondary | ICD-10-CM | POA: Diagnosis not present

## 2017-07-16 DIAGNOSIS — N189 Chronic kidney disease, unspecified: Secondary | ICD-10-CM | POA: Diagnosis not present

## 2017-07-16 DIAGNOSIS — C7951 Secondary malignant neoplasm of bone: Secondary | ICD-10-CM | POA: Diagnosis not present

## 2017-07-16 DIAGNOSIS — E7800 Pure hypercholesterolemia, unspecified: Secondary | ICD-10-CM | POA: Insufficient documentation

## 2017-07-16 DIAGNOSIS — K219 Gastro-esophageal reflux disease without esophagitis: Secondary | ICD-10-CM | POA: Diagnosis not present

## 2017-07-16 DIAGNOSIS — F419 Anxiety disorder, unspecified: Secondary | ICD-10-CM | POA: Diagnosis not present

## 2017-07-16 NOTE — Progress Notes (Signed)
Dr. Pearlie Oyster examined [name] [name] today during her radiation consult appointment, and she assessed an area of possible infection to [name] [name]'s surgical site. There were two areas involved, the area where her skin folds over to her Left axilla and the previous JP drain site. Dr. Isidore Moos requested I call Dr. Kellie Moor office to see if she could be seen today for evaluation of this site. I called his office, and was able to schedule her an appointment today with a PA at the office. I was able to arrange her transportation via [employer] transportation to [facility] from the [facility] and then back to her facility, [facility]. [name] [name] is aware of this plan and agreeable. I will update her nurse at [facility] with these developments.

## 2017-07-16 NOTE — Progress Notes (Addendum)
Radiation Oncology         [phone]) [phone] ________________________________  Initial outpatient Consultation  Name: [name] [name] MRN: [id number]  Date: 07/16/2017  DOB: Apr 21, 1946  PY:[id number], No Pcp Per  Nicholas Lose, MD   REFERRING PHYSICIAN: Nicholas Lose, MD  DIAGNOSIS:    ICD-10-CM   1. Malignant neoplasm of lower-outer quadrant of left breast of female, estrogen receptor positive ([place]) C50.512    Z17.0    T4bN3a Stage IIIb  Left Breast LOQ Invasive Lobular Carcinoma, ER(+) 95% / PR(+) 95% / Her2(-), Grade 2  CHIEF COMPLAINT: Here to discuss management of left breast cancer  HISTORY OF PRESENT ILLNESS::[name] [name] is a 71 y.o. female who presented with left nipple inversion with palpable lump which was present beginning 2-3 months ago. Prior to this, she had a mammogram last in 12/06/15 which was unremarkable. She underwent diagnostic mammogram on 05/07/17 following finding this mass which showed an irregular mass within the left breast which involved the subareolar and outer left breast, measuring approximately 9cm at the greatest dimension. This exam also showed at least 3 enlarged and abnormal lymph nodes within the left axillae. Her right breast was visualized as otherwise normal in appearance. At the end of Aug 2018, CT CAP was negative for metastatic disease, distantly.  Following this, she was referred into Dr Tsuei's care and he performed a mastectomy with lymph node dissection. This was tolerated well and the specimens were sent for biopsy.   Pathology  on 06/25/17 showed invasive lobular carcinoma (10.5cm measurement in the dominant tumor) with characteristics as described above in the diagnosis. Surgical margins were negative. Metastatic carcinoma was observed in 12/13 of the lymph nodes surveyed (many with ECE) . Invasive lobular carcinoma was also observed in the fatty tissue and left chest wall skeletal musculature taken from the left breast. She has now been referred to  radiation oncology to discuss the role radiation therapy may play in the management of her left breast cancer.   Overall, she states that she is doing okay. Pt currently resides in [facility] for long-term care involving CP with cognitive deficits, CHF, and osteoarthritis. She is currently wheelchair bound. She does not have a caregiver with her at this time. Pt reports that her mastectomy scar is healing alright, but she is still having difficulty raising her arm above the level of her shoulder without pain.     PREVIOUS RADIATION THERAPY: No  PAST MEDICAL HISTORY:  has a past medical history of Anemia; Anxiety; Bronchitis; Cerebral palsy ([place]); Chronic diastolic CHF (congestive heart failure) ([place]) (05/27/2014); CKD (chronic kidney disease); Cough (05/27/2014); Depression; DM type 2 with diabetic peripheral neuropathy ([place]); Dysphagia; Ejection fraction; GERD (gastroesophageal reflux disease); Glaucoma; Gout; Hyperaldosteronism ([place]); Hypercholesterolemia; Hypertension; Hypertensive heart disease; Morbid obesity ([place]); Neuropathy; Osteoarthritis; and PAD (peripheral artery disease) ([place]).    PAST SURGICAL HISTORY: Past Surgical History:  Procedure Laterality Date  . BLADDER SURGERY    . CLOSED REDUCTION PROXIMAL FIBULAR FRACTURE Left   . LEG SURGERY Left    upper leg  . MASTECTOMY WITH AXILLARY LYMPH NODE DISSECTION Left 06/25/2017   Procedure: LEFT MODIFIED RADICAL MASTECTOMY WITH AXILLARY LYMPH NODE DISSECTION ERAS PATHWAY;  Surgeon: Donnie Mesa, MD;  Location: [facility];  Service: General;  Laterality: Left;    FAMILY HISTORY: family history includes Heart disease in her father and mother.  SOCIAL HISTORY:  reports that she quit smoking about 13 years ago. Her smoking use included Cigarettes. She has a 80.00 pack-year  smoking history. She has never used smokeless tobacco. She reports that she does not drink alcohol or use drugs.  ALLERGIES: Miconazole  MEDICATIONS:  Current Outpatient  Prescriptions  Medication Sig Dispense Refill  . acetaminophen (TYLENOL) 325 MG tablet Take 325 mg by mouth every 6 (six) hours as needed for moderate pain. Do not exceed 4 gms in 24 hours    . ADVAIR DISKUS 250-50 MCG/DOSE AEPB Inhale 1 puff into the lungs 2 (two) times daily.  0  . allopurinol (ZYLOPRIM) 300 MG tablet Take 300 mg by mouth daily.    . Amino Acids-Protein Hydrolys (FEEDING SUPPLEMENT, PRO-STAT SUGAR FREE 64,) LIQD Take 30 mLs by mouth 2 (two) times daily.    . Calcium Carbonate (CALTRATE 600 PO) Take 600 mg by mouth 2 (two) times daily.    . Cholecalciferol 50000 units TABS Take 50,000 Units by mouth every 30 (thirty) days. On the 17th for osteoporosis    . cyclobenzaprine (FLEXERIL) 10 MG tablet Take 10 mg by mouth 3 (three) times daily.     Marland Kitchen escitalopram (LEXAPRO) 10 MG tablet Take 10 mg by mouth daily.    Marland Kitchen esomeprazole (NEXIUM) 40 MG capsule Take 40 mg by mouth daily at 12 noon.    . famotidine (PEPCID) 20 MG tablet Take 20 mg by mouth 2 (two) times daily.    . ferrous sulfate 325 (65 FE) MG tablet Take 325 mg by mouth daily with breakfast.    . furosemide (LASIX) 80 MG tablet Take 80 mg by mouth daily.    Marland Kitchen gabapentin (NEURONTIN) 300 MG capsule Take 300 mg by mouth 3 (three) times daily.    Marland Kitchen guaiFENesin (MUCINEX) 600 MG 12 hr tablet Take 600 mg by mouth 2 (two) times daily.    Marland Kitchen ipratropium-albuterol (DUONEB) 0.5-2.5 (3) MG/3ML SOLN Take 3 mLs by nebulization every 6 (six) hours as needed (cough, congestion, and wheezing).    Marland Kitchen loratadine (CLARITIN) 10 MG tablet Take 10 mg by mouth daily.    . metFORMIN (GLUCOPHAGE) 500 MG tablet Take 500 mg by mouth 2 (two) times daily.     . metoprolol tartrate (LOPRESSOR) 25 MG tablet Take 25 mg by mouth 2 (two) times daily.    . Multiple Vitamins-Minerals (CERTAGEN PO) Take 1 tablet by mouth daily.    Marland Kitchen olopatadine (PATANOL) 0.1 % ophthalmic solution Place 1 drop into both eyes daily.     . ondansetron (ZOFRAN) 4 MG tablet Take 4 mg  by mouth every 6 (six) hours as needed for nausea or vomiting.    Marland Kitchen oxyCODONE-acetaminophen (PERCOCET) 10-325 MG tablet Take one tablet by mouth three times daily for pain. Do not exceed 4gm of Tylenol in 24 hours 90 tablet 0  . oxyCODONE-acetaminophen (PERCOCET) 10-325 MG tablet Take 1 tablet by mouth every 8 (eight) hours as needed for pain. 20 tablet 0  . polyethylene glycol (MIRALAX / GLYCOLAX) packet Take 17 g by mouth daily.    . potassium chloride SA (K-DUR,KLOR-CON) 20 MEQ tablet Take 20 mEq by mouth 2 (two) times daily.    Marland Kitchen Propylene Glycol (SYSTANE BALANCE OP) Apply 1 drop to eye 2 (two) times daily.    . sennosides-docusate sodium (SENOKOT-S) 8.6-50 MG tablet Take 1 tablet by mouth at bedtime.    . simvastatin (ZOCOR) 10 MG tablet Take 10 mg by mouth at bedtime.    . valsartan (DIOVAN) 40 MG tablet Take 1 tablet (40 mg total) by mouth daily. 30 tablet 6  . vitamin  C (ASCORBIC ACID) 500 MG tablet Take 500 mg by mouth 2 (two) times daily.    Marland Kitchen zinc oxide (BALMEX) 11.3 % CREA cream Apply 1 application topically daily as needed. Apply to buttocks    . docusate sodium (COLACE) 100 MG capsule Take 100 mg by mouth 2 (two) times daily.     No current facility-administered medications for this encounter.     REVIEW OF SYSTEMS: as above   PHYSICAL EXAM:  temperature is 98.3 F (36.8 C). Her blood pressure is 121/65 and her pulse is 78. Her oxygen saturation is 94%.   General: Alert and oriented, in no acute distress HEENT: Head is normocephalic. Extraocular movements are intact. Oropharynx is clear. Her right eye deviates medially.  Neck: Neck is supple, no palpable cervical or supraclavicular lymphadenopathy. Heart: Regular in rate and rhythm with distant heart sounds.  Chest: She has low pitched coarse sounds on ausculation throughout her chest.  Abdomen: Soft, nontender, nondistended, with no rigidity or guarding. Extremities: No cyanosis. She has edema in the ankles bilaterally. She  does have lymphedema along the left arm. Lymphatics: see Neck Exam Skin: No concerning lesions. Musculoskeletal: symmetric strength and muscle tone throughout. Limited ROM of the LUE: She has trouble raising her arm past 90 degrees. Neurologic:right eye deviates medially.  She has some decreased strength in the upper extremities but it feels symmetric. She is able to move her lower extremities, but there is some weakness noted.  Psychiatric: pleasant affect Breasts: She has a left mastectomy scar that extends into the left axilla. The skin appears erythematous and there is small amount of milky discharge in the left axilla where her skin is folded. There is an open area where a drain was removed along the lateral left chest wall which is about 1cm. She still has some steri strips medially along her chest wall scar.    ECOG = 4  0 - Asymptomatic (Fully active, able to carry on all predisease activities without restriction)  1 - Symptomatic but completely ambulatory (Restricted in physically strenuous activity but ambulatory and able to carry out work of a light or sedentary nature. For example, light housework, office work)  2 - Symptomatic, <50% in bed during the day (Ambulatory and capable of all self care but unable to carry out any work activities. Up and about more than 50% of waking hours)  3 - Symptomatic, >50% in bed, but not bedbound (Capable of only limited self-care, confined to bed or chair 50% or more of waking hours)  4 - Bedbound (Completely disabled. Cannot carry on any self-care. Totally confined to bed or chair)  5 - Death   Eustace Pen MM, Creech RH, Tormey DC, et al. [phone]). "Toxicity and response criteria of the [facility]". [place] Oncol. 5 (6): 649-55   LABORATORY DATA:  Lab Results  Component Value Date   WBC 12.5 (H) 06/26/2017   HGB 11.5 (L) 06/26/2017   HCT 36.0 06/26/2017   MCV 98.6 06/26/2017   PLT 270 06/26/2017   CMP       Component Value Date/Time   NA 139 06/26/2017 0529   K 4.2 06/26/2017 0529   CL 104 06/26/2017 0529   CO2 27 06/26/2017 0529   GLUCOSE 131 (H) 06/26/2017 0529   BUN 14 06/26/2017 0529   CREATININE 0.74 06/26/2017 0529   CALCIUM 8.5 (L) 06/26/2017 0529   PROT 7.1 01/05/2014 1720   ALBUMIN 3.9 01/05/2014 1720   AST 23 01/05/2014  1720   ALT 20 01/05/2014 1720   ALKPHOS 73 01/05/2014 1720   BILITOT 0.3 01/05/2014 1720   GFRNONAA >60 06/26/2017 0529   GFRAA >60 06/26/2017 0529         RADIOGRAPHY: as above    IMPRESSION/PLAN: This is a wonderful 71 y.o. female who presents today for initial consult of her left breast cancer. Of note, she does have a h/o cerebral palsy with cognitive deficits which makes maintaining an overall and complete medical history and the progression of her disease somewhat difficult.  She is adamant that we not call her brother, who is listed in contacts. She states he has congnitive issues and medical issues.  No one from her care facility is present today either. No other family members noted by patient.  It was a pleasure meeting the patient today. We discussed the risks, benefits, and side effects of radiotherapy. I recommend radiotherapy to the left chest wall to reduce her risk of locoregional recurrence by 2/3.  We discussed that radiation would take approximately 6 weeks to complete and that I would give the patient a few weeks to heal following surgery before starting treatment planning. Chemotherapy is not recommended at this time.  We spoke about acute effects including skin irritation and fatigue as well as much less common late effects including internal organ injury or irritation. We spoke about the latest technology that is used to minimize the risk of late effects for patients undergoing radiotherapy to the breast or chest wall. No guarantees of treatment were given. The patient is enthusiastic about proceeding with treatment. I look forward to  participating in the patient's care.  She signed consent today.  We will refer her to physical therapy for ongoing lymphedema of the left arm and with her decreased ROM of the left arm. We will also attempt to have her seen today with the surgery clinic to evaluate the erythema involving her left axilla. I am concerned that she has early signs of cellulitis and may require a course of antibiotics to aid with this.   Of note as above, she reports that her only family member who is in charge of her estate and planning is her brother. She states that her brother has memory problems and asks [place] not to call him due to his own medical issues and she does not wish for him to know about this appointment or her plan of care. She does not have any other immediate family members that we can call for her coordination of care.  Given the circumstances, we obtained her written consent today. If she does not proceed with radiotherapy, she will be at extraordinary risk for locoregional recurrence.  Will schedule simulation in about 3 weeks to allow more healing and PT to occur. __________________________________________   Eppie Gibson, MD  This document serves as a record of services personally performed by Eppie Gibson, MD. It was created on his behalf by Reola Mosher, a trained medical scribe. The creation of this record is based on the scribe's personal observations and the provider's statements to them. This document has been checked and approved by the attending provider.

## 2017-07-21 ENCOUNTER — Other Ambulatory Visit: Payer: Self-pay | Admitting: Radiation Oncology

## 2017-07-21 DIAGNOSIS — C50512 Malignant neoplasm of lower-outer quadrant of left female breast: Secondary | ICD-10-CM

## 2017-07-21 DIAGNOSIS — Z170 Estrogen receptor positive status [ER+]: Principal | ICD-10-CM

## 2017-07-22 ENCOUNTER — Telehealth: Payer: Self-pay | Admitting: *Deleted

## 2017-07-22 NOTE — Telephone Encounter (Signed)
Pt. To have PT on 07-23-17 @ 11 am @ [facility], pt. Aware of appt.

## 2017-07-23 ENCOUNTER — Ambulatory Visit: Payer: Medicare Other | Attending: Radiation Oncology | Admitting: Physical Therapy

## 2017-07-23 DIAGNOSIS — R293 Abnormal posture: Secondary | ICD-10-CM | POA: Insufficient documentation

## 2017-07-23 DIAGNOSIS — Z483 Aftercare following surgery for neoplasm: Secondary | ICD-10-CM | POA: Diagnosis present

## 2017-07-23 DIAGNOSIS — M6281 Muscle weakness (generalized): Secondary | ICD-10-CM

## 2017-07-23 DIAGNOSIS — M25612 Stiffness of left shoulder, not elsewhere classified: Secondary | ICD-10-CM | POA: Diagnosis present

## 2017-07-23 NOTE — Therapy (Addendum)
[place], [place], [address] Phone: [phone]   Fax:  [phone]  Physical Therapy Evaluation  Patient Details  Name: [name] [name] MRN: [id number] Date of Birth: Jul 25, 1946 Referring Provider: Dr. Isidore Moos   Encounter Date: 07/23/2017      PT End of Session - 07/23/17 1257    Visit Number 1   Number of Visits 9   Date for PT Re-Evaluation 08/23/17   PT Start Time 1100   PT Stop Time 1145   PT Time Calculation (min) 45 min   Activity Tolerance Patient tolerated treatment well   Behavior During Therapy [facility] for tasks assessed/performed      Past Medical History:  Diagnosis Date  . Anemia   . Anxiety   . Bronchitis   . Cerebral palsy ([place])   . Chronic diastolic CHF (congestive heart failure) ([place]) 05/27/2014  . CKD (chronic kidney disease)   . Cough 05/27/2014   ?  ACE cough August, 2015?when d/c'd   . Depression   . DM type 2 with diabetic peripheral neuropathy ([place])   . Dysphagia   . Ejection fraction   . GERD (gastroesophageal reflux disease)   . Glaucoma   . Gout   . Hyperaldosteronism ([place])   . Hypercholesterolemia   . Hypertension   . Hypertensive heart disease   . Morbid obesity ([place])   . Neuropathy   . Osteoarthritis   . PAD (peripheral artery disease) ([place])     Past Surgical History:  Procedure Laterality Date  . BLADDER SURGERY    . CLOSED REDUCTION PROXIMAL FIBULAR FRACTURE Left   . LEG SURGERY Left    upper leg  . MASTECTOMY WITH AXILLARY LYMPH NODE DISSECTION Left 06/25/2017   Procedure: LEFT MODIFIED RADICAL MASTECTOMY WITH AXILLARY LYMPH NODE DISSECTION ERAS PATHWAY;  Surgeon: Donnie Mesa, MD;  Location: [facility];  Service: General;  Laterality: Left;    There were no vitals filed for this visit.       Subjective Assessment - 07/23/17 1109    Subjective "I'm doing fine " She feels like she has some swelling under her left arm    Pertinent History Pt had a left  mastectomy with 12/ 13 nodes positive on 06/25/2017 .  She will not have to have chemotherapy but will have radiation Past history significant for CP with cognitive and mobility deficits, DM CHF,OA   Patient Stated Goals to move her left arm better    Currently in Pain? No/denies            [facility] PT Assessment - 07/23/17 0001      Assessment   Medical Diagnosis left breast cancer    Referring Provider Dr. Isidore Moos    Onset Date/Surgical Date 06/25/17   Hand Dominance Left     Restrictions   Other Position/Activity Restrictions pt stays in wheelchair most of the ime      Balance Screen   Has the patient fallen in the past 6 months No   Has the patient had a decrease in activity level because of a fear of falling?  No  pt stays in wheelchair most of the time    Is the patient reluctant to leave their home because of a fear of falling?  No     Home Environment   Living Environment Skilled nursing facility   Additional Comments comes to PT in a [facility]      Prior Function   Level of Independence Needs  assistance with ADLs;Needs assistance with transfers   Vocation On disability     Cognition   Overall Cognitive Status History of cognitive impairments - at baseline     Observation/Other Assessments   Observations pt smiling, pleasant, able to independently control her power scooter. She has a dressing over left chest. covering an open area at lateral chet that is bright red with skin irriation.  She has steristrips across chest    Skin Integrity open at lateral left chest      Coordination   Gross Motor Movements are Fluid and Coordinated No   Fine Motor Movements are Fluid and Coordinated No   Coordination and Movement Description pt is limted by CP, but is able to perform tasks with arms such as controlling joystick on scooter and holding her purse or a magazine      Posture/Postural Control   Posture/Postural Control Postural limitations   Postural Limitations Rounded  Shoulders;Forward head;Increased thoracic kyphosis   Posture Comments obesity and poor muscle tone     ROM / Strength   AROM / PROM / Strength AROM;Strength     AROM   Overall AROM Comments shoulder elevation limited by posture and muscle weakness    Right Shoulder Flexion 88 Degrees   Right Shoulder ABduction 68 Degrees   Right Shoulder External Rotation 20 Degrees   Left Shoulder Flexion 70 Degrees   Left Shoulder ABduction 70 Degrees   Left Shoulder External Rotation 20 Degrees     Strength   Overall Strength Deficits   Overall Strength Comments generlly 2+/5           LYMPHEDEMA/ONCOLOGY QUESTIONNAIRE - 07/23/17 1133      Right Upper Extremity Lymphedema   15 cm Proximal to Olecranon Process 45 cm   Olecranon Process 31 cm   15 cm Proximal to Ulnar Styloid Process 31.5 cm   Just Proximal to Ulnar Styloid Process 18.5 cm  had watch band on   Across Hand at [employer] 21 cm   At [place] of 2nd Digit 6.5 cm     Left Upper Extremity Lymphedema   15 cm Proximal to Olecranon Process 46 cm   Olecranon Process 32 cm   15 cm Proximal to Ulnar Styloid Process 32.5 cm   Just Proximal to Ulnar Styloid Process 19.5 cm   Across Hand at [employer] 20.5 cm   At [place] of 2nd Digit 6.5 cm            Objective measurements completed on examination: See above findings.                  PT Education - 07/23/17 1256    Education provided Yes   Education Details talked with patient and nurse at [facility] about potentially receiveing PT for shoulder ROM at the facility.  Also sent note for the same requesting they contact Dr. Isidore Moos for prescription if needed    Person(s) Educated Patient;Caregiver(s)   Methods Explanation;Handout   Comprehension Verbalized understanding                [facility] Goals - 07/23/17 1305      CC Long Term Goal  #1   Title Pt will have 120 degrees of left shoulder abduction PROM so that pt can receive  radition therapy   Time 4   Period Weeks   Status New     CC Long Term Goal  #2   Title Pt will be independent  in a home exercise program that she can do on her own for posture improvment and shoulder range of motion    Time 4   Period Weeks   Status New             Plan - 07/23/17 1257    Clinical Impression Statement Ms Gebhart is having delayed healing from lateral aspect of her mastectomy incision.  She has limited ROM and muscle strength.  She will need a hoyer lift or other assistance for transfers at radiation  She has some increase in her left upper arm but that may be from recent procedures.  Do not feel she needs specialized  complete decongestive therapy at this time, but needs shoulder ROM and strengthening that could be done by the PTs at her facility.  I contacted the nurse verbally and in writing and they will follow up.  If PT cannot be provided at the facility, they will call us to schedule appointments for pt to come to our center,    History and Personal Factors relevant to plan of care: dependent in transfers, delayed healing.    Clinical Presentation Evolving   Clinical Presentation due to: will have radiation    Clinical Decision Making Moderate   Rehab Potential Good   Clinical Impairments Affecting Rehab Potential 13 nodes removed, obesity, limited mobilty, general weakness    PT Frequency 2x / week   PT Duration 4 weeks   PT Treatment/Interventions Patient/family education;Therapeutic exercise;Therapeutic activities;ADLs/Self Care Home Management;Manual techniques;Passive range of motion   PT Next Visit Plan PROM, manual techniques for stretching, progress to AROM and strengthening.    Consulted and Agree with Plan of Care Patient      Patient will benefit from skilled therapeutic intervention in order to improve the following deficits and impairments:  Decreased skin integrity, Obesity, Decreased knowledge of precautions, Decreased strength, Impaired UE  functional use, Decreased range of motion, Postural dysfunction, Decreased endurance, Decreased mobility  Visit Diagnosis: Aftercare following surgery for neoplasm - Plan: PT plan of care cert/re-cert  Stiffness of left shoulder, not elsewhere classified - Plan: PT plan of care cert/re-cert  Abnormal posture - Plan: PT plan of care cert/re-cert  Muscle weakness (generalized) - Plan: PT plan of care cert/re-cert      G-Codes - 50/53/97 1308    Functional Assessment Tool Used (Outpatient Only) clincial judgement    Functional Limitation Carrying, moving and handling objects   Carrying, Moving and Handling Objects Current Status (Q7341) At least 80 percent but less than 100 percent impaired, limited or restricted   Carrying, Moving and Handling Objects Goal Status (P3790) At least 60 percent but less than 80 percent impaired, limited or restricted       Problem List Patient Active Problem List   Diagnosis Date Noted  . Pressure injury of skin 06/26/2017  . Invasive lobular carcinoma of breast, stage 2, left ([place]) 06/25/2017  . Malignant neoplasm of lower-outer quadrant of left breast of female, estrogen receptor positive ([place]) 06/13/2017  . Ulcer of thigh ([place]) 01/22/2016  . Chronic diastolic CHF (congestive heart failure) ([place]) 05/27/2014  . Cough 05/27/2014  . Essential hypertension 05/27/2014  . Ejection fraction   . Hypercholesterolemia   . Hypertensive heart disease   . GERD (gastroesophageal reflux disease)   . Osteoarthritis   . Gout   . Cerebral palsy ([place])   . DM type 2 with diabetic peripheral neuropathy ([place])   . PAD (peripheral artery disease) ([place])   . Glaucoma   .  Neuropathy   . CKD (chronic kidney disease)    Donato Heinz. Owens Shark PT  Norwood Levo 07/23/2017, 1:08 PM  [place], [place], [address] Phone: [phone]   Fax:  [phone]  Name: EDA MAGNUSSEN MRN:  [id number] Date of Birth: July 14, 1946 PHYSICAL THERAPY DISCHARGE SUMMARY  Visits from Start of Care: 1  Current functional level related to goals / functional outcomes: unknown   Remaining deficits: unknown   Education / Equipment: None-eval only  Plan: Patient agrees to discharge.  Patient goals were not met. Patient is being discharged due to not returning since the last visit.  ?????    Maudry Diego, PT 05/28/18 2:33 PM

## 2017-08-11 ENCOUNTER — Ambulatory Visit
Admission: RE | Admit: 2017-08-11 | Discharge: 2017-08-11 | Disposition: A | Discharge status: Home / Self Care | Payer: Medicare Other | Source: Ambulatory Visit | Attending: Radiation Oncology | Admitting: Radiation Oncology

## 2017-08-11 DIAGNOSIS — C50512 Malignant neoplasm of lower-outer quadrant of left female breast: Secondary | ICD-10-CM

## 2017-08-11 DIAGNOSIS — Z510 Encounter for antineoplastic radiation therapy: Secondary | ICD-10-CM | POA: Diagnosis not present

## 2017-08-11 DIAGNOSIS — Z170 Estrogen receptor positive status [ER+]: Principal | ICD-10-CM

## 2017-08-11 NOTE — Progress Notes (Addendum)
  Radiation Oncology         [phone] ________________________________  Name: [name] [name] MRN: [id number]  Date: 08/11/2017  DOB: [birth year]  SIMULATION AND TREATMENT PLANNING NOTE    Outpatient  DIAGNOSIS:     ICD-10-CM   1. Malignant neoplasm of lower-outer quadrant of left breast of female, estrogen receptor positive ([place]) C50.512    Z17.0     NARRATIVE:  The patient was brought to the [facility].  Identity was confirmed.  All relevant records and images related to the planned course of therapy were reviewed.  The patient freely provided informed written consent to proceed with treatment after reviewing the details related to the planned course of therapy. The consent form was witnessed and verified by the simulation staff.    Then, the patient was set-up in a stable reproducible supine position for radiation therapy with her ipsilateral arm over her head, and her upper body secured in a custom-made Vac-lok device.  CT images were obtained.  Surface markings were placed.  The CT images were loaded into the planning software.  Patient was not felt to be a good candidate for breathhold technique.  TREATMENT PLANNING NOTE: Treatment planning then occurred.  The radiation prescription was entered and confirmed.     A total of 5 medically necessary complex treatment devices were fabricated and supervised by me: 4 fields with MLCs for custom blocks to protect heart, and lungs;  and, a Vac-lok. MORE COMPLEX DEVICES MAY BE MADE IN DOSIMETRY FOR FIELD IN FIELD BEAMS FOR DOSE HOMOGENEITY.  I have requested : 3D Simulation which is medically necessary to give adequate dose to at risk tissues while sparing lungs and heart.  I have requested a DVH of the following structures: lungs, heart, esophagus and spinal cord.    The patient will receive 50 Gy in 25 fractions to the left chest wall and regional nodes with 4 fields.   This will be followed by a left chest wall scar  boost.  Optical Surface Tracking Plan:  Since intensity modulated radiotherapy (IMRT) and 3D conformal radiation treatment methods are predicated on accurate and precise positioning for treatment, intrafraction motion monitoring is medically necessary to ensure accurate and safe treatment delivery. The ability to quantify intrafraction motion without excessive ionizing radiation dose can only be performed with optical surface tracking. Accordingly, surface imaging offers the opportunity to obtain 3D measurements of patient position throughout IMRT and 3D treatments without excessive radiation exposure. I am ordering optical surface tracking for this patient's upcoming course of radiotherapy.  ________________________________   Reference:  Ursula Alert, J, et al. Surface imaging-based analysis of intrafraction motion for breast radiotherapy patients.Journal of [place], n. 6, nov. 2014. ISSN [id number].  Available at: <http://www.jacmp.org/index.php/jacmp/article/view/4957>.    -----------------------------------  Eppie Gibson, MD

## 2017-08-15 DIAGNOSIS — Z510 Encounter for antineoplastic radiation therapy: Secondary | ICD-10-CM | POA: Diagnosis not present

## 2017-08-18 ENCOUNTER — Ambulatory Visit
Admission: RE | Admit: 2017-08-18 | Discharge: 2017-08-18 | Disposition: A | Discharge status: Home / Self Care | Payer: Medicare Other | Source: Ambulatory Visit | Attending: Radiation Oncology | Admitting: Radiation Oncology

## 2017-08-18 DIAGNOSIS — C50512 Malignant neoplasm of lower-outer quadrant of left female breast: Secondary | ICD-10-CM

## 2017-08-18 DIAGNOSIS — Z510 Encounter for antineoplastic radiation therapy: Secondary | ICD-10-CM | POA: Diagnosis not present

## 2017-08-18 DIAGNOSIS — Z170 Estrogen receptor positive status [ER+]: Principal | ICD-10-CM

## 2017-08-18 MED ORDER — ALRA NON-METALLIC DEODORANT (RAD-ONC)
1.0000 "application " | Freq: Once | TOPICAL | Status: AC
  Administered 2017-08-18: 1 via TOPICAL

## 2017-08-18 MED ORDER — RADIAPLEXRX EX GEL
Freq: Once | CUTANEOUS | Status: AC
Start: 2017-08-18 — End: 2017-08-18
  Administered 2017-08-18: 15:00:00 via TOPICAL

## 2017-08-18 NOTE — Progress Notes (Signed)
Pt here for patient teaching.  Pt given Radiation and You booklet, skin care instructions, Alra deodorant and Radiaplex gel.  Reviewed areas of pertinence such as fatigue, skin changes, breast tenderness and breast swelling . Pt able to give teach back of to pat skin, use unscented/gentle soap and drink plenty of water,apply Radiaplex bid, avoid applying anything to skin within 4 hours of treatment, avoid wearing an under wire bra and to use an electric razor if they must shave. Pt verbalizes understanding of information given and will contact nursing with any questions or concerns.  I will also call [facility] and speak to an RN to reinforce the education.   [email or web address]

## 2017-08-19 ENCOUNTER — Telehealth: Payer: Self-pay

## 2017-08-19 ENCOUNTER — Ambulatory Visit
Admission: RE | Admit: 2017-08-19 | Discharge: 2017-08-19 | Disposition: A | Discharge status: Home / Self Care | Payer: Medicare Other | Source: Ambulatory Visit | Attending: Radiation Oncology | Admitting: Radiation Oncology

## 2017-08-19 ENCOUNTER — Ambulatory Visit: Payer: Medicare Other

## 2017-08-19 DIAGNOSIS — Z510 Encounter for antineoplastic radiation therapy: Secondary | ICD-10-CM | POA: Diagnosis not present

## 2017-08-19 NOTE — Telephone Encounter (Signed)
I called Ms. Hemberger's place of residence, [facility] and [place]. I informed them of the education packet, Radiaplex cream, and Alra deodorant that I gave [name] [name] yesterday. I spoke with her nurse Julieta Bellini and instructed her on the use of the Radiaplex cream and Alra deodorant twice daily. I educated her about possible side effects related to the radiation therapy Ms. Karis is receiving to her Left Breast as already documented in the education section of her chart. I gave her my name and number and told her she could call me for any concerns or questions related to the care of Ms. Leiphart during her radiation.

## 2017-08-20 ENCOUNTER — Ambulatory Visit
Admission: RE | Admit: 2017-08-20 | Discharge: 2017-08-20 | Disposition: A | Discharge status: Home / Self Care | Payer: Medicare Other | Source: Ambulatory Visit | Attending: Radiation Oncology | Admitting: Radiation Oncology

## 2017-08-20 DIAGNOSIS — Z510 Encounter for antineoplastic radiation therapy: Secondary | ICD-10-CM | POA: Diagnosis not present

## 2017-08-21 ENCOUNTER — Ambulatory Visit
Admission: RE | Admit: 2017-08-21 | Discharge: 2017-08-21 | Disposition: A | Discharge status: Home / Self Care | Payer: Medicare Other | Source: Ambulatory Visit | Attending: Radiation Oncology | Admitting: Radiation Oncology

## 2017-08-21 DIAGNOSIS — Z510 Encounter for antineoplastic radiation therapy: Secondary | ICD-10-CM | POA: Diagnosis not present

## 2017-08-22 ENCOUNTER — Ambulatory Visit
Admission: RE | Admit: 2017-08-22 | Discharge: 2017-08-22 | Disposition: A | Discharge status: Home / Self Care | Payer: Medicare Other | Source: Ambulatory Visit | Attending: Radiation Oncology | Admitting: Radiation Oncology

## 2017-08-22 DIAGNOSIS — Z510 Encounter for antineoplastic radiation therapy: Secondary | ICD-10-CM | POA: Diagnosis not present

## 2017-08-25 ENCOUNTER — Ambulatory Visit
Admission: RE | Admit: 2017-08-25 | Discharge: 2017-08-25 | Disposition: A | Discharge status: Home / Self Care | Payer: Medicare Other | Source: Ambulatory Visit | Attending: Radiation Oncology | Admitting: Radiation Oncology

## 2017-08-25 DIAGNOSIS — Z510 Encounter for antineoplastic radiation therapy: Secondary | ICD-10-CM | POA: Diagnosis not present

## 2017-08-26 ENCOUNTER — Ambulatory Visit
Admission: RE | Admit: 2017-08-26 | Discharge: 2017-08-26 | Disposition: A | Discharge status: Home / Self Care | Payer: Medicare Other | Source: Ambulatory Visit | Attending: Radiation Oncology | Admitting: Radiation Oncology

## 2017-08-26 DIAGNOSIS — Z510 Encounter for antineoplastic radiation therapy: Secondary | ICD-10-CM | POA: Diagnosis not present

## 2017-08-27 ENCOUNTER — Ambulatory Visit
Admission: RE | Admit: 2017-08-27 | Discharge: 2017-08-27 | Disposition: A | Discharge status: Home / Self Care | Payer: Medicare Other | Source: Ambulatory Visit | Attending: Radiation Oncology | Admitting: Radiation Oncology

## 2017-08-27 DIAGNOSIS — Z510 Encounter for antineoplastic radiation therapy: Secondary | ICD-10-CM | POA: Diagnosis not present

## 2017-08-28 ENCOUNTER — Ambulatory Visit
Admission: RE | Admit: 2017-08-28 | Discharge: 2017-08-28 | Disposition: A | Discharge status: Home / Self Care | Payer: Medicare Other | Source: Ambulatory Visit | Attending: Radiation Oncology | Admitting: Radiation Oncology

## 2017-08-28 DIAGNOSIS — Z510 Encounter for antineoplastic radiation therapy: Secondary | ICD-10-CM | POA: Diagnosis not present

## 2017-08-29 ENCOUNTER — Ambulatory Visit
Admission: RE | Admit: 2017-08-29 | Discharge: 2017-08-29 | Disposition: A | Discharge status: Home / Self Care | Payer: Medicare Other | Source: Ambulatory Visit | Attending: Radiation Oncology | Admitting: Radiation Oncology

## 2017-08-29 ENCOUNTER — Other Ambulatory Visit (HOSPITAL_BASED_OUTPATIENT_CLINIC_OR_DEPARTMENT_OTHER): Payer: Self-pay

## 2017-08-29 DIAGNOSIS — Z510 Encounter for antineoplastic radiation therapy: Secondary | ICD-10-CM | POA: Diagnosis not present

## 2017-08-29 DIAGNOSIS — R0683 Snoring: Secondary | ICD-10-CM

## 2017-08-29 DIAGNOSIS — G4733 Obstructive sleep apnea (adult) (pediatric): Secondary | ICD-10-CM

## 2017-09-01 ENCOUNTER — Ambulatory Visit
Admission: RE | Admit: 2017-09-01 | Discharge: 2017-09-01 | Disposition: A | Discharge status: Home / Self Care | Payer: Medicare Other | Source: Ambulatory Visit | Attending: Radiation Oncology | Admitting: Radiation Oncology

## 2017-09-01 DIAGNOSIS — Z510 Encounter for antineoplastic radiation therapy: Secondary | ICD-10-CM | POA: Diagnosis not present

## 2017-09-02 ENCOUNTER — Ambulatory Visit
Admission: RE | Admit: 2017-09-02 | Discharge: 2017-09-02 | Disposition: A | Discharge status: Home / Self Care | Payer: Medicare Other | Source: Ambulatory Visit | Attending: Radiation Oncology | Admitting: Radiation Oncology

## 2017-09-02 ENCOUNTER — Other Ambulatory Visit: Payer: Self-pay

## 2017-09-02 ENCOUNTER — Other Ambulatory Visit (HOSPITAL_COMMUNITY): Payer: Medicare Other

## 2017-09-02 ENCOUNTER — Ambulatory Visit (HOSPITAL_COMMUNITY): Payer: Medicare Other | Attending: Cardiology

## 2017-09-02 DIAGNOSIS — I110 Hypertensive heart disease with heart failure: Secondary | ICD-10-CM | POA: Diagnosis not present

## 2017-09-02 DIAGNOSIS — I5032 Chronic diastolic (congestive) heart failure: Secondary | ICD-10-CM | POA: Diagnosis not present

## 2017-09-02 DIAGNOSIS — E119 Type 2 diabetes mellitus without complications: Secondary | ICD-10-CM | POA: Insufficient documentation

## 2017-09-02 DIAGNOSIS — Z87891 Personal history of nicotine dependence: Secondary | ICD-10-CM | POA: Diagnosis not present

## 2017-09-02 DIAGNOSIS — R0600 Dyspnea, unspecified: Secondary | ICD-10-CM | POA: Diagnosis not present

## 2017-09-02 DIAGNOSIS — Z510 Encounter for antineoplastic radiation therapy: Secondary | ICD-10-CM | POA: Diagnosis not present

## 2017-09-02 DIAGNOSIS — E785 Hyperlipidemia, unspecified: Secondary | ICD-10-CM | POA: Diagnosis not present

## 2017-09-02 DIAGNOSIS — I348 Other nonrheumatic mitral valve disorders: Secondary | ICD-10-CM | POA: Diagnosis not present

## 2017-09-03 ENCOUNTER — Ambulatory Visit
Admission: RE | Admit: 2017-09-03 | Discharge: 2017-09-03 | Disposition: A | Discharge status: Home / Self Care | Payer: Medicare Other | Source: Ambulatory Visit | Attending: Radiation Oncology | Admitting: Radiation Oncology

## 2017-09-03 DIAGNOSIS — Z510 Encounter for antineoplastic radiation therapy: Secondary | ICD-10-CM | POA: Diagnosis not present

## 2017-09-04 ENCOUNTER — Ambulatory Visit
Admission: RE | Admit: 2017-09-04 | Discharge: 2017-09-04 | Disposition: A | Discharge status: Home / Self Care | Payer: Medicare Other | Source: Ambulatory Visit | Attending: Radiation Oncology | Admitting: Radiation Oncology

## 2017-09-04 DIAGNOSIS — Z510 Encounter for antineoplastic radiation therapy: Secondary | ICD-10-CM | POA: Diagnosis not present

## 2017-09-05 ENCOUNTER — Ambulatory Visit
Admission: RE | Admit: 2017-09-05 | Discharge: 2017-09-05 | Disposition: A | Discharge status: Home / Self Care | Payer: Medicare Other | Source: Ambulatory Visit | Attending: Radiation Oncology | Admitting: Radiation Oncology

## 2017-09-05 DIAGNOSIS — Z510 Encounter for antineoplastic radiation therapy: Secondary | ICD-10-CM | POA: Diagnosis not present

## 2017-09-08 ENCOUNTER — Ambulatory Visit
Admission: RE | Admit: 2017-09-08 | Discharge: 2017-09-08 | Disposition: A | Discharge status: Home / Self Care | Payer: Medicare Other | Source: Ambulatory Visit | Attending: Radiation Oncology | Admitting: Radiation Oncology

## 2017-09-08 DIAGNOSIS — Z510 Encounter for antineoplastic radiation therapy: Secondary | ICD-10-CM | POA: Diagnosis not present

## 2017-09-09 ENCOUNTER — Ambulatory Visit
Admission: RE | Admit: 2017-09-09 | Discharge: 2017-09-09 | Disposition: A | Discharge status: Home / Self Care | Payer: Medicare Other | Source: Ambulatory Visit | Attending: Radiation Oncology | Admitting: Radiation Oncology

## 2017-09-09 DIAGNOSIS — Z510 Encounter for antineoplastic radiation therapy: Secondary | ICD-10-CM | POA: Diagnosis not present

## 2017-09-10 ENCOUNTER — Ambulatory Visit
Admission: RE | Admit: 2017-09-10 | Discharge: 2017-09-10 | Disposition: A | Discharge status: Home / Self Care | Payer: Medicare Other | Source: Ambulatory Visit | Attending: Radiation Oncology | Admitting: Radiation Oncology

## 2017-09-10 DIAGNOSIS — Z510 Encounter for antineoplastic radiation therapy: Secondary | ICD-10-CM | POA: Diagnosis not present

## 2017-09-15 ENCOUNTER — Ambulatory Visit
Admission: RE | Admit: 2017-09-15 | Discharge: 2017-09-15 | Disposition: A | Discharge status: Home / Self Care | Payer: Medicare Other | Source: Ambulatory Visit | Attending: Radiation Oncology | Admitting: Radiation Oncology

## 2017-09-15 DIAGNOSIS — Z510 Encounter for antineoplastic radiation therapy: Secondary | ICD-10-CM | POA: Diagnosis not present

## 2017-09-16 ENCOUNTER — Telehealth: Payer: Self-pay

## 2017-09-16 ENCOUNTER — Ambulatory Visit
Admission: RE | Admit: 2017-09-16 | Discharge: 2017-09-16 | Disposition: A | Discharge status: Home / Self Care | Payer: Medicare Other | Source: Ambulatory Visit | Attending: Radiation Oncology | Admitting: Radiation Oncology

## 2017-09-16 DIAGNOSIS — Z510 Encounter for antineoplastic radiation therapy: Secondary | ICD-10-CM | POA: Diagnosis not present

## 2017-09-16 NOTE — Telephone Encounter (Signed)
Dr. Isidore Moos asked me to call [name] [name]'s facility [name] to confirm they are applying Radiaplex twice daily to her Radiation site and to begin applying neosporin in the open areas two to three times daily where [name] [name]'s skin is peeling and not intact. I spoke with Cecilie Kicks LPN and she verified that Radiaplex has been being applied as directed and she took a verbal order to begin applying neosporin as directed. She knows to call me with any further questions or concerns.

## 2017-09-17 ENCOUNTER — Ambulatory Visit
Admission: RE | Admit: 2017-09-17 | Discharge: 2017-09-17 | Disposition: A | Discharge status: Home / Self Care | Payer: Medicare Other | Source: Ambulatory Visit | Attending: Radiation Oncology | Admitting: Radiation Oncology

## 2017-09-17 DIAGNOSIS — Z510 Encounter for antineoplastic radiation therapy: Secondary | ICD-10-CM | POA: Diagnosis not present

## 2017-09-18 ENCOUNTER — Ambulatory Visit
Admission: RE | Admit: 2017-09-18 | Discharge: 2017-09-18 | Disposition: A | Discharge status: Home / Self Care | Payer: Medicare Other | Source: Ambulatory Visit | Attending: Radiation Oncology | Admitting: Radiation Oncology

## 2017-09-18 DIAGNOSIS — Z510 Encounter for antineoplastic radiation therapy: Secondary | ICD-10-CM | POA: Diagnosis not present

## 2017-09-19 ENCOUNTER — Ambulatory Visit
Admission: RE | Admit: 2017-09-19 | Discharge: 2017-09-19 | Disposition: A | Discharge status: Home / Self Care | Payer: Medicare Other | Source: Ambulatory Visit | Attending: Radiation Oncology | Admitting: Radiation Oncology

## 2017-09-19 DIAGNOSIS — Z510 Encounter for antineoplastic radiation therapy: Secondary | ICD-10-CM | POA: Diagnosis not present

## 2017-09-22 ENCOUNTER — Ambulatory Visit
Admission: RE | Admit: 2017-09-22 | Discharge: 2017-09-22 | Disposition: A | Discharge status: Home / Self Care | Payer: Medicare Other | Source: Ambulatory Visit | Attending: Radiation Oncology | Admitting: Radiation Oncology

## 2017-09-22 DIAGNOSIS — Z510 Encounter for antineoplastic radiation therapy: Secondary | ICD-10-CM | POA: Diagnosis not present

## 2017-09-23 ENCOUNTER — Ambulatory Visit
Admission: RE | Admit: 2017-09-23 | Discharge: 2017-09-23 | Disposition: A | Discharge status: Home / Self Care | Payer: Medicare Other | Source: Ambulatory Visit | Attending: Radiation Oncology | Admitting: Radiation Oncology

## 2017-09-23 DIAGNOSIS — Z510 Encounter for antineoplastic radiation therapy: Secondary | ICD-10-CM | POA: Diagnosis not present

## 2017-09-24 ENCOUNTER — Ambulatory Visit
Admission: RE | Admit: 2017-09-24 | Discharge: 2017-09-24 | Disposition: A | Discharge status: Home / Self Care | Payer: Medicare Other | Source: Ambulatory Visit | Attending: Radiation Oncology | Admitting: Radiation Oncology

## 2017-09-24 ENCOUNTER — Ambulatory Visit: Payer: Medicare Other

## 2017-09-24 DIAGNOSIS — Z510 Encounter for antineoplastic radiation therapy: Secondary | ICD-10-CM | POA: Diagnosis not present

## 2017-09-25 ENCOUNTER — Ambulatory Visit: Payer: Medicare Other

## 2017-09-25 ENCOUNTER — Ambulatory Visit
Admission: RE | Admit: 2017-09-25 | Discharge: 2017-09-25 | Disposition: A | Discharge status: Home / Self Care | Payer: Medicare Other | Source: Ambulatory Visit | Attending: Radiation Oncology | Admitting: Radiation Oncology

## 2017-09-25 DIAGNOSIS — Z510 Encounter for antineoplastic radiation therapy: Secondary | ICD-10-CM | POA: Diagnosis not present

## 2017-09-26 ENCOUNTER — Ambulatory Visit
Admission: RE | Admit: 2017-09-26 | Discharge: 2017-09-26 | Disposition: A | Discharge status: Home / Self Care | Payer: Medicare Other | Source: Ambulatory Visit | Attending: Radiation Oncology | Admitting: Radiation Oncology

## 2017-09-26 ENCOUNTER — Ambulatory Visit: Payer: Medicare Other

## 2017-09-26 DIAGNOSIS — Z510 Encounter for antineoplastic radiation therapy: Secondary | ICD-10-CM | POA: Diagnosis not present

## 2017-09-29 ENCOUNTER — Ambulatory Visit: Payer: Medicare Other

## 2017-09-29 ENCOUNTER — Ambulatory Visit: Payer: Medicare Other | Admitting: Hematology and Oncology

## 2017-09-30 ENCOUNTER — Ambulatory Visit: Payer: Medicare Other

## 2017-09-30 ENCOUNTER — Ambulatory Visit
Admission: RE | Admit: 2017-09-30 | Discharge: 2017-09-30 | Disposition: A | Discharge status: Home / Self Care | Payer: Medicare Other | Source: Ambulatory Visit | Attending: Radiation Oncology | Admitting: Radiation Oncology

## 2017-09-30 DIAGNOSIS — Z510 Encounter for antineoplastic radiation therapy: Secondary | ICD-10-CM | POA: Diagnosis not present

## 2017-09-30 DIAGNOSIS — Z170 Estrogen receptor positive status [ER+]: Principal | ICD-10-CM

## 2017-09-30 DIAGNOSIS — C50512 Malignant neoplasm of lower-outer quadrant of left female breast: Secondary | ICD-10-CM

## 2017-09-30 MED ORDER — RADIAPLEXRX EX GEL
Freq: Once | CUTANEOUS | Status: AC
  Administered 2017-09-30: 15:00:00 via TOPICAL

## 2017-10-01 ENCOUNTER — Ambulatory Visit
Admission: RE | Admit: 2017-10-01 | Discharge: 2017-10-01 | Disposition: A | Discharge status: Home / Self Care | Payer: Medicare Other | Source: Ambulatory Visit | Attending: Radiation Oncology | Admitting: Radiation Oncology

## 2017-10-01 ENCOUNTER — Ambulatory Visit: Payer: Medicare Other

## 2017-10-01 ENCOUNTER — Encounter: Payer: Self-pay | Admitting: Radiation Oncology

## 2017-10-01 DIAGNOSIS — Z510 Encounter for antineoplastic radiation therapy: Secondary | ICD-10-CM | POA: Diagnosis not present

## 2017-10-02 ENCOUNTER — Ambulatory Visit: Payer: Medicare Other

## 2017-10-03 ENCOUNTER — Ambulatory Visit: Payer: Medicare Other

## 2017-10-03 NOTE — Progress Notes (Signed)
  Radiation Oncology         [phone] ________________________________  Name: MERY GUADALUPE MRN: [id number]  Date: 10/01/2017  DOB: January 06, 1946  End of Treatment Note  Diagnosis:   71 y.o. female with T4bN3a Stage IIIb Left Breast LOQ Invasive Lobular Carcinoma, ER(+) 95% / PR(+) 95% / Her2(-), Grade 2    Indication for treatment:  Curative       Radiation treatment dates:   08/18/2017 - 10/01/2017  Site/dose:   The left chest wall and regional lymph nodes were treated to 50 Gy in 25 fractions, followed by a 10 Gy boost in 5 fractions to the left chest wall scar.  Beams/energy:    1. Left Chest Wall + IM nodes: 3D // 10X Photon 2. Left SCV, PAB Lymph Nodes: 3D // 10X, 6X Photon 3. Boost: electrons // 9 MeV Photon  Narrative: The patient tolerated radiation treatment relatively well, without any pain or fatigue. She had erythema in the RT fields with moist desquamation in the left axilla and left chest wall without infection. Her caregivers apply radiaplex and neosporin to these areas.  Plan: The patient has completed radiation treatment. Continue applying skin care products to allow for skin healing. The patient will return to radiation oncology clinic for routine followup in one month. I advised them to call or return sooner if they have any questions or concerns related to their recovery or treatment.  -----------------------------------  Eppie Gibson, MD  This document serves as a record of services personally performed by Eppie Gibson, MD. It was created on her behalf by Rae Lips, a trained medical scribe. The creation of this record is based on the scribe's personal observations and the provider's statements to them. This document has been checked and approved by the attending provider.

## 2017-10-04 ENCOUNTER — Telehealth: Payer: Self-pay | Admitting: Hematology and Oncology

## 2017-10-04 NOTE — Telephone Encounter (Signed)
Scheduled appt per 12/14 sch message - patient is aware of appt date and time.

## 2017-10-06 ENCOUNTER — Ambulatory Visit: Payer: Medicare Other

## 2017-10-14 NOTE — Assessment & Plan Note (Deleted)
06/25/2017: Left breast modified radical mastectomy: Invasive lobular carcinoma, 10.5 cm, grade 2, tumor involves skin and nipple, 12/13 lymph nodes positive, involves skeletal muscle and adipose tissue, ER 95%, PR 95%, HER-2 negative, Ki-67 25% to 40% T4b N3a stage IIIB AJCC 8  Adj XRT 08/19/17- 10/01/17 Not a candidate for chemo  Plan: Adj Anti-estrogen therapy with Letrozole RTC in 3 months for SCP visit

## 2017-10-15 ENCOUNTER — Ambulatory Visit: Payer: Medicare Other | Admitting: Hematology and Oncology

## 2017-10-15 NOTE — Progress Notes (Deleted)
Patient Care Team: Patient, No Pcp Per as PCP - General (General Practice)  DIAGNOSIS:  Encounter Diagnosis  Name Primary?  . Malignant neoplasm of lower-outer quadrant of left breast of female, estrogen receptor positive ([place])     SUMMARY OF ONCOLOGIC HISTORY:   Malignant neoplasm of lower-outer quadrant of left breast of female, estrogen receptor positive ([place])   05/08/2017 Initial Diagnosis    Left breast biopsy 3:30 position: Invasive lobular carcinoma ER 95%, PR 95%, Ki-67 25%, HER-2 negative ratio 0.97, grade 2; lymph node biopsy positive for metastatic cancer, ER 95%, PR 95%, Ki-67 40%, HER-2 negative ratio 1.2; left breast mass 7 irregular 9 cm with nipple retraction inflammatory breast cancer, at least 3 enlarged abnormal lymph nodes, T4d N1 stage IIIa (AJCC 8)      06/25/2017 Surgery    Left breast modified radical mastectomy: Invasive lobular carcinoma, 10.5 cm, grade 2, tumor involves skin and nipple, 12/13 lymph nodes positive, involves skeletal muscle and adipose tissue, ER 95%, PR 95%, HER-2 negative, Ki-67 25% to 40% T4b N3a stage IIIB AJCC 8      08/19/2017 - 10/01/2017 Radiation Therapy    Adj XRT       CHIEF COMPLIANT: Follow-up after adjuvant radiation therapy  INTERVAL HISTORY: [name] [name] is a 71 year old lady with above-mentioned history left breast cancer who has advanced stage IIIb disease.  Her health is not well enough to receive adjuvant chemotherapy.  She completed adjuvant radiation therapy and is here today to discuss adjuvant hormonal therapy treatment plan.  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  She is here today to discuss starting antiestrogen therapy.  REVIEW OF SYSTEMS:   Constitutional: Denies fevers, chills or abnormal weight loss Eyes: Denies blurriness of vision Ears, nose, mouth, throat, and face: Denies mucositis or sore throat Respiratory: Denies cough, dyspnea or wheezes Cardiovascular: Denies  palpitation, chest discomfort Gastrointestinal:  Denies nausea, heartburn or change in bowel habits Skin: Denies abnormal skin rashes Lymphatics: Denies new lymphadenopathy or easy bruising Neurological:Denies numbness, tingling or new weaknesses Behavioral/Psych: Mood is stable, no new changes  Extremities: No lower extremity edema  All other systems were reviewed with the patient and are negative.  I have reviewed the past medical history, past surgical history, social history and family history with the patient and they are unchanged from previous note.  ALLERGIES:  is allergic to miconazole.  MEDICATIONS:  Current Outpatient Medications  Medication Sig Dispense Refill  . acetaminophen (TYLENOL) 325 MG tablet Take 325 mg by mouth every 6 (six) hours as needed for moderate pain. Do not exceed 4 gms in 24 hours    . ADVAIR DISKUS 250-50 MCG/DOSE AEPB Inhale 1 puff into the lungs 2 (two) times daily.  0  . allopurinol (ZYLOPRIM) 300 MG tablet Take 300 mg by mouth daily.    . Amino Acids-Protein Hydrolys (FEEDING SUPPLEMENT, PRO-STAT SUGAR FREE 64,) LIQD Take 30 mLs by mouth 2 (two) times daily.    . Calcium Carbonate (CALTRATE 600 PO) Take 600 mg by mouth 2 (two) times daily.    . Cholecalciferol 50000 units TABS Take 50,000 Units by mouth every 30 (thirty) days. On the 17th for osteoporosis    . cyclobenzaprine (FLEXERIL) 10 MG tablet Take 10 mg by mouth 3 (three) times daily.     Marland Kitchen docusate sodium (COLACE) 100 MG capsule Take 100 mg by mouth 2 (two) times daily.    Marland Kitchen escitalopram (LEXAPRO) 10 MG tablet Take 10  mg by mouth daily.    Marland Kitchen esomeprazole (NEXIUM) 40 MG capsule Take 40 mg by mouth daily at 12 noon.    . famotidine (PEPCID) 20 MG tablet Take 20 mg by mouth 2 (two) times daily.    . ferrous sulfate 325 (65 FE) MG tablet Take 325 mg by mouth daily with breakfast.    . furosemide (LASIX) 80 MG tablet Take 80 mg by mouth daily.    Marland Kitchen gabapentin (NEURONTIN) 300 MG capsule Take 300 mg  by mouth 3 (three) times daily.    Marland Kitchen guaiFENesin (MUCINEX) 600 MG 12 hr tablet Take 600 mg by mouth 2 (two) times daily.    Marland Kitchen ipratropium-albuterol (DUONEB) 0.5-2.5 (3) MG/3ML SOLN Take 3 mLs by nebulization every 6 (six) hours as needed (cough, congestion, and wheezing).    Marland Kitchen loratadine (CLARITIN) 10 MG tablet Take 10 mg by mouth daily.    . metFORMIN (GLUCOPHAGE) 500 MG tablet Take 500 mg by mouth 2 (two) times daily.     . metoprolol tartrate (LOPRESSOR) 25 MG tablet Take 25 mg by mouth 2 (two) times daily.    . Multiple Vitamins-Minerals (CERTAGEN PO) Take 1 tablet by mouth daily.    Marland Kitchen olopatadine (PATANOL) 0.1 % ophthalmic solution Place 1 drop into both eyes daily.     . ondansetron (ZOFRAN) 4 MG tablet Take 4 mg by mouth every 6 (six) hours as needed for nausea or vomiting.    Marland Kitchen oxyCODONE-acetaminophen (PERCOCET) 10-325 MG tablet Take one tablet by mouth three times daily for pain. Do not exceed 4gm of Tylenol in 24 hours 90 tablet 0  . oxyCODONE-acetaminophen (PERCOCET) 10-325 MG tablet Take 1 tablet by mouth every 8 (eight) hours as needed for pain. 20 tablet 0  . polyethylene glycol (MIRALAX / GLYCOLAX) packet Take 17 g by mouth daily.    . potassium chloride SA (K-DUR,KLOR-CON) 20 MEQ tablet Take 20 mEq by mouth 2 (two) times daily.    Marland Kitchen Propylene Glycol (SYSTANE BALANCE OP) Apply 1 drop to eye 2 (two) times daily.    . sennosides-docusate sodium (SENOKOT-S) 8.6-50 MG tablet Take 1 tablet by mouth at bedtime.    . simvastatin (ZOCOR) 10 MG tablet Take 10 mg by mouth at bedtime.    . valsartan (DIOVAN) 40 MG tablet Take 1 tablet (40 mg total) by mouth daily. 30 tablet 6  . vitamin C (ASCORBIC ACID) 500 MG tablet Take 500 mg by mouth 2 (two) times daily.    Marland Kitchen zinc oxide (BALMEX) 11.3 % CREA cream Apply 1 application topically daily as needed. Apply to buttocks     No current facility-administered medications for this visit.     PHYSICAL EXAMINATION: ECOG PERFORMANCE STATUS: 1 -  Symptomatic but completely ambulatory  There were no vitals filed for this visit. There were no vitals filed for this visit.  GENERAL:alert, no distress and comfortable SKIN: skin color, texture, turgor are normal, no rashes or significant lesions EYES: normal, Conjunctiva are pink and non-injected, sclera clear OROPHARYNX:no exudate, no erythema and lips, buccal mucosa, and tongue normal  NECK: supple, thyroid normal size, non-tender, without nodularity LYMPH:  no palpable lymphadenopathy in the cervical, axillary or inguinal LUNGS: clear to auscultation and percussion with normal breathing effort HEART: regular rate & rhythm and no murmurs and no lower extremity edema ABDOMEN:abdomen soft, non-tender and normal bowel sounds MUSCULOSKELETAL:no cyanosis of digits and no clubbing  NEURO: alert & oriented x 3 with fluent speech, no focal motor/sensory deficits EXTREMITIES:  No lower extremity edema   LABORATORY DATA:  I have reviewed the data as listed   Chemistry      Component Value Date/Time   NA 139 06/26/2017 0529   K 4.2 06/26/2017 0529   CL 104 06/26/2017 0529   CO2 27 06/26/2017 0529   BUN 14 06/26/2017 0529   CREATININE 0.74 06/26/2017 0529      Component Value Date/Time   CALCIUM 8.5 (L) 06/26/2017 0529   ALKPHOS 73 01/05/2014 1720   AST 23 01/05/2014 1720   ALT 20 01/05/2014 1720   BILITOT 0.3 01/05/2014 1720       Lab Results  Component Value Date   WBC 12.5 (H) 06/26/2017   HGB 11.5 (L) 06/26/2017   HCT 36.0 06/26/2017   MCV 98.6 06/26/2017   PLT 270 06/26/2017   NEUTROABS 4.6 01/05/2014    ASSESSMENT & PLAN:  Malignant neoplasm of lower-outer quadrant of left breast of female, estrogen receptor positive ([place]) 06/25/2017: Left breast modified radical mastectomy: Invasive lobular carcinoma, 10.5 cm, grade 2, tumor involves skin and nipple, 12/13 lymph nodes positive, involves skeletal muscle and adipose tissue, ER 95%, PR 95%, HER-2 negative, Ki-67 25% to  40% T4b N3a stage IIIB AJCC 8  Adj XRT 08/19/17- 10/01/17 Not a candidate for chemo  Plan: Adj Anti-estrogen therapy with Letrozole Letrozole counseling:We discussed the risks and benefits of anti-estrogen therapy with aromatase inhibitors. These include but not limited to insomnia, hot flashes, mood changes, vaginal dryness, bone density loss, and weight gain. We strongly believe that the benefits far outweigh the risks. Patient understands these risks and consented to starting treatment. Planned treatment duration is 7 years.  RTC in 3 months for SCP visit   I spent 25 minutes talking to the patient of which more than half was spent in counseling and coordination of care.  No orders of the defined types were placed in this encounter.  The patient has a good understanding of the overall plan. she agrees with it. she will call with any problems that may develop before the next visit here.   Harriette Ohara, MD 10/15/17

## 2017-10-24 ENCOUNTER — Encounter: Payer: Self-pay | Admitting: Radiation Oncology

## 2017-10-24 ENCOUNTER — Telehealth: Payer: Self-pay

## 2017-10-24 NOTE — Telephone Encounter (Signed)
I called [facility] today to notify them of a change to [place] Behrman's appointments at the [facility]. Her Radiation Oncology apt has been changed to 11/04/17 at 2:00 and a new appointment to see Dr. Lindi Adie has been scheduled for 11/04/17 at 2:45. I spoke with Shantel RN and she voiced her understanding and has scheduled transportation for that day.

## 2017-10-29 ENCOUNTER — Encounter (HOSPITAL_BASED_OUTPATIENT_CLINIC_OR_DEPARTMENT_OTHER): Payer: Medicare Other

## 2017-11-04 ENCOUNTER — Encounter: Payer: Self-pay | Admitting: Radiation Oncology

## 2017-11-04 ENCOUNTER — Ambulatory Visit
Admission: RE | Admit: 2017-11-04 | Discharge: 2017-11-04 | Disposition: A | Discharge status: Home / Self Care | Payer: Medicare Other | Source: Ambulatory Visit | Attending: Radiation Oncology | Admitting: Radiation Oncology

## 2017-11-04 ENCOUNTER — Inpatient Hospital Stay: Payer: Medicare Other | Attending: Hematology and Oncology | Admitting: Hematology and Oncology

## 2017-11-04 ENCOUNTER — Telehealth: Payer: Self-pay | Admitting: Hematology and Oncology

## 2017-11-04 VITALS — BP 125/74 | HR 79 | Temp 98.3°F

## 2017-11-04 DIAGNOSIS — Z79899 Other long term (current) drug therapy: Secondary | ICD-10-CM | POA: Insufficient documentation

## 2017-11-04 DIAGNOSIS — Z9012 Acquired absence of left breast and nipple: Secondary | ICD-10-CM | POA: Insufficient documentation

## 2017-11-04 DIAGNOSIS — C50512 Malignant neoplasm of lower-outer quadrant of left female breast: Secondary | ICD-10-CM | POA: Insufficient documentation

## 2017-11-04 DIAGNOSIS — D1801 Hemangioma of skin and subcutaneous tissue: Secondary | ICD-10-CM | POA: Insufficient documentation

## 2017-11-04 DIAGNOSIS — Z170 Estrogen receptor positive status [ER+]: Secondary | ICD-10-CM

## 2017-11-04 DIAGNOSIS — C50912 Malignant neoplasm of unspecified site of left female breast: Secondary | ICD-10-CM | POA: Diagnosis not present

## 2017-11-04 DIAGNOSIS — N6453 Retraction of nipple: Secondary | ICD-10-CM | POA: Diagnosis not present

## 2017-11-04 DIAGNOSIS — Z08 Encounter for follow-up examination after completed treatment for malignant neoplasm: Secondary | ICD-10-CM | POA: Insufficient documentation

## 2017-11-04 DIAGNOSIS — Z09 Encounter for follow-up examination after completed treatment for conditions other than malignant neoplasm: Secondary | ICD-10-CM | POA: Diagnosis not present

## 2017-11-04 DIAGNOSIS — Z923 Personal history of irradiation: Secondary | ICD-10-CM | POA: Diagnosis not present

## 2017-11-04 MED ORDER — RADIAPLEXRX EX GEL
Freq: Once | CUTANEOUS | Status: AC
  Administered 2017-11-04: 15:00:00 via TOPICAL

## 2017-11-04 MED ORDER — LETROZOLE 2.5 MG PO TABS: 2.5000 mg | ORAL_TABLET | Freq: Every day | ORAL | 3 refills | Status: AC

## 2017-11-04 NOTE — Progress Notes (Signed)
Radiation Oncology         [phone] ________________________________  Name: [name] [name] MRN: [id number]  Date: 11/04/2017  DOB: 1946-06-02  Follow-Up Visit Note  Outpatient  CC: Patient, No Pcp Per  Nicholas Lose, MD  Diagnosis and Prior Radiotherapy:    ICD-10-CM   1. Invasive lobular carcinoma of breast, stage 2, left ([facility]) C50.912 hyaluronate sodium (RADIAPLEXRX) gel  2. Malignant neoplasm of lower-outer quadrant of left breast of female, estrogen receptor positive ([place]) C50.512    Z17.0     T4bN3a Stage IIIb  Left Breast LOQ Invasive Lobular Carcinoma, ER(+) 95% / PR(+) 95% / Her2(-), Grade II  Radiation completed 10/01/17  CHIEF COMPLAINT: Here for follow-up and surveillance of her left breast cancer  Narrative:  The patient returns today for routine 1 month follow-up.  She denies pain or fatigue but continues to have open areas to her left chest wall. She denies excessive drainage.   ALLERGIES:  is allergic to miconazole.  Meds: Current Outpatient Medications  Medication Sig Dispense Refill  . acetaminophen (TYLENOL) 325 MG tablet Take 325 mg by mouth every 6 (six) hours as needed for moderate pain. Do not exceed 4 gms in 24 hours    . ADVAIR DISKUS 250-50 MCG/DOSE AEPB Inhale 1 puff into the lungs 2 (two) times daily.  0  . allopurinol (ZYLOPRIM) 300 MG tablet Take 300 mg by mouth daily.    . Amino Acids-Protein Hydrolys (FEEDING SUPPLEMENT, PRO-STAT SUGAR FREE 64,) LIQD Take 30 mLs by mouth 2 (two) times daily.    . Calcium Carbonate (CALTRATE 600 PO) Take 600 mg by mouth 2 (two) times daily.    . Cholecalciferol 50000 units TABS Take 50,000 Units by mouth every 30 (thirty) days. On the 17th for osteoporosis    . cyclobenzaprine (FLEXERIL) 10 MG tablet Take 10 mg by mouth 3 (three) times daily.     Marland Kitchen docusate sodium (COLACE) 100 MG capsule Take 100 mg by mouth 2 (two) times daily.    Marland Kitchen escitalopram (LEXAPRO) 10 MG tablet Take 10 mg by mouth daily.    Marland Kitchen  esomeprazole (NEXIUM) 40 MG capsule Take 40 mg by mouth daily at 12 noon.    . famotidine (PEPCID) 20 MG tablet Take 20 mg by mouth 2 (two) times daily.    . ferrous sulfate 325 (65 FE) MG tablet Take 325 mg by mouth daily with breakfast.    . furosemide (LASIX) 80 MG tablet Take 80 mg by mouth daily.    Marland Kitchen gabapentin (NEURONTIN) 300 MG capsule Take 300 mg by mouth 3 (three) times daily.    Marland Kitchen guaiFENesin (MUCINEX) 600 MG 12 hr tablet Take 600 mg by mouth 2 (two) times daily.    Marland Kitchen ipratropium-albuterol (DUONEB) 0.5-2.5 (3) MG/3ML SOLN Take 3 mLs by nebulization every 6 (six) hours as needed (cough, congestion, and wheezing).    Marland Kitchen loratadine (CLARITIN) 10 MG tablet Take 10 mg by mouth daily.    . metFORMIN (GLUCOPHAGE) 500 MG tablet Take 500 mg by mouth 2 (two) times daily.     . metoprolol tartrate (LOPRESSOR) 25 MG tablet Take 25 mg by mouth 2 (two) times daily.    . Multiple Vitamins-Minerals (CERTAGEN PO) Take 1 tablet by mouth daily.    Marland Kitchen olopatadine (PATANOL) 0.1 % ophthalmic solution Place 1 drop into both eyes daily.     . ondansetron (ZOFRAN) 4 MG tablet Take 4 mg by mouth every 6 (six) hours as needed for nausea  or vomiting.    Marland Kitchen oxyCODONE-acetaminophen (PERCOCET) 10-325 MG tablet Take one tablet by mouth three times daily for pain. Do not exceed 4gm of Tylenol in 24 hours 90 tablet 0  . oxyCODONE-acetaminophen (PERCOCET) 10-325 MG tablet Take 1 tablet by mouth every 8 (eight) hours as needed for pain. 20 tablet 0  . polyethylene glycol (MIRALAX / GLYCOLAX) packet Take 17 g by mouth daily.    . potassium chloride SA (K-DUR,KLOR-CON) 20 MEQ tablet Take 20 mEq by mouth 2 (two) times daily.    Marland Kitchen Propylene Glycol (SYSTANE BALANCE OP) Apply 1 drop to eye 2 (two) times daily.    . sennosides-docusate sodium (SENOKOT-S) 8.6-50 MG tablet Take 1 tablet by mouth at bedtime.    . simvastatin (ZOCOR) 10 MG tablet Take 10 mg by mouth at bedtime.    . valsartan (DIOVAN) 40 MG tablet Take 1 tablet (40 mg  total) by mouth daily. 30 tablet 6  . vitamin C (ASCORBIC ACID) 500 MG tablet Take 500 mg by mouth 2 (two) times daily.    Marland Kitchen zinc oxide (BALMEX) 11.3 % CREA cream Apply 1 application topically daily as needed. Apply to buttocks    . letrozole (FEMARA) 2.5 MG tablet Take 1 tablet (2.5 mg total) by mouth daily. 90 tablet 3   No current facility-administered medications for this encounter.     Physical Findings: The patient is in no acute distress. Patient is alert and oriented.  temperature is 98.3 F (36.8 C). Her blood pressure is 125/74 and her pulse is 79. Her oxygen saturation is 95%. .    Patient is in a wheel chair. Pleasant affect. No acute distress. I appreciated two patches of bleeding, desquamated skin over the central left chest wall which extend over several centimeters. This unhealed area is superficial without signs of infection.  Lab Findings: Lab Results  Component Value Date   WBC 12.5 (H) 06/26/2017   HGB 11.5 (L) 06/26/2017   HCT 36.0 06/26/2017   MCV 98.6 06/26/2017   PLT 270 06/26/2017    Radiographic Findings: No results found.  Impression/Plan: Overall, she is recovering well and feeling better but she has some persistent patches of moist/bleeding desquamation. I recommended that her caregivers apply neosporin TID to the unhealed areas and Radiaplex over the left chest and upper back where her skin is intact. Samples of the triple abx ointment were provided as well as Radiaplex. I wrote a note to her care facility and we gave her a 6 week follow up to I can check on how she's healing.     _____________________________________   Eppie Gibson, MD  This document serves as a record of services personally performed by Eppie Gibson, MD. It was created on his behalf by Linward Natal, a trained medical scribe. The creation of this record is based on the scribe's personal observations and the provider's statements to them. This document has been checked and approved  by the attending provider.

## 2017-11-04 NOTE — Assessment & Plan Note (Signed)
06/25/2017: Left breast modified radical mastectomy: Invasive lobular carcinoma, 10.5 cm, grade 2, tumor involves skin and nipple, 12/13 lymph nodes positive, involves skeletal muscle and adipose tissue, ER 95%, PR 95%, HER-2 negative, Ki-67 25% to 40% T4b N3a stage IIIB AJCC 8  Treatment plan: 1. Ideally patient needs chemotherapy but she is in no shape to receive chemotherapy. Her performance status is poor and she has many comorbidities that makes chemotherapy not an option. 2. adjuvant radiation therapy 08/19/2017-10/01/2017  3. Adjuvant antiestrogen therapy with letrozole 2.5 mg daily started 11/04/2017 --------------------------------------------------------------------------------------------------------------------------------------------- Letrozole counseling: We discussed the risks and benefits of anti-estrogen therapy with aromatase inhibitors. These include but not limited to insomnia, hot flashes, mood changes, vaginal dryness, bone density loss, and weight gain. We strongly believe that the benefits far outweigh the risks. Patient understands these risks and consented to starting treatment. Planned treatment duration is 5-7 years.  Return to clinic in 3 months for survivorship care plan visit and toxicity check

## 2017-11-04 NOTE — Telephone Encounter (Signed)
Gave patient AVs and calendar of upcoming April appointments.  °

## 2017-11-04 NOTE — Progress Notes (Signed)
Ms. Seguin presents for follow up of radiation completed 12/12 /18 to her Left Chest wall. She denies pain or fatigue. She still has several open areas to her Left chest wall. She (and her caregiver) tell me that they clean the area daily and put radiaplex cream to the area. There does appear to be a small amount of drainage present to the wound. She denies having drainage visible to her clothes at any time, and there is none visible today. She will see Dr. Lindi Adie today at 2:45.  BP 125/74   Pulse 79   Temp 98.3 F (36.8 C)   SpO2 95% Comment: room air

## 2017-11-04 NOTE — Progress Notes (Signed)
Patient Care Team: Patient, No Pcp Per as PCP - General (General Practice)  DIAGNOSIS:  Encounter Diagnosis  Name Primary?  . Malignant neoplasm of lower-outer quadrant of left breast of female, estrogen receptor positive ([place])     SUMMARY OF ONCOLOGIC HISTORY:   Malignant neoplasm of lower-outer quadrant of left breast of female, estrogen receptor positive ([place])   05/08/2017 Initial Diagnosis    Left breast biopsy 3:30 position: Invasive lobular carcinoma ER 95%, PR 95%, Ki-67 25%, HER-2 negative ratio 0.97, grade 2; lymph node biopsy positive for metastatic cancer, ER 95%, PR 95%, Ki-67 40%, HER-2 negative ratio 1.2; left breast mass 7 irregular 9 cm with nipple retraction inflammatory breast cancer, at least 3 enlarged abnormal lymph nodes, T4d N1 stage IIIa (AJCC 8)      06/25/2017 Surgery    Left breast modified radical mastectomy: Invasive lobular carcinoma, 10.5 cm, grade 2, tumor involves skin and nipple, 12/13 lymph nodes positive, involves skeletal muscle and adipose tissue, ER 95%, PR 95%, HER-2 negative, Ki-67 25% to 40% T4b N3a stage IIIB AJCC 8      08/19/2017 - 10/01/2017 Radiation Therapy    Adj XRT       CHIEF COMPLIANT: Follow-up after adjuvant radiation  INTERVAL HISTORY: [name] [name] is a 72 year old with above-mentioned history of left breast cancer who underwent mastectomy followed by adjuvant radiation and is here today to discuss starting antiestrogen therapy.  She has recovered from the effects of radiation and is accompanied by her caregiver.  REVIEW OF SYSTEMS:   Constitutional: Denies fevers, chills or abnormal weight loss Eyes: Denies blurriness of vision Ears, nose, mouth, throat, and face: Denies mucositis or sore throat Respiratory: Denies cough, dyspnea or wheezes Cardiovascular: Denies palpitation, chest discomfort Gastrointestinal:  Denies nausea, heartburn or change in bowel habits Skin: Denies abnormal skin rashes Lymphatics: Denies new  lymphadenopathy or easy bruising Neurological:Denies numbness, tingling or new weaknesses Extremities: No lower extremity edema Breast: Left mastectomy All other systems were reviewed with the patient and are negative.  I have reviewed the past medical history, past surgical history, social history and family history with the patient and they are unchanged from previous note.  ALLERGIES:  is allergic to miconazole.  MEDICATIONS:  Current Outpatient Medications  Medication Sig Dispense Refill  . acetaminophen (TYLENOL) 325 MG tablet Take 325 mg by mouth every 6 (six) hours as needed for moderate pain. Do not exceed 4 gms in 24 hours    . ADVAIR DISKUS 250-50 MCG/DOSE AEPB Inhale 1 puff into the lungs 2 (two) times daily.  0  . allopurinol (ZYLOPRIM) 300 MG tablet Take 300 mg by mouth daily.    . Amino Acids-Protein Hydrolys (FEEDING SUPPLEMENT, PRO-STAT SUGAR FREE 64,) LIQD Take 30 mLs by mouth 2 (two) times daily.    . Calcium Carbonate (CALTRATE 600 PO) Take 600 mg by mouth 2 (two) times daily.    . Cholecalciferol 50000 units TABS Take 50,000 Units by mouth every 30 (thirty) days. On the 17th for osteoporosis    . cyclobenzaprine (FLEXERIL) 10 MG tablet Take 10 mg by mouth 3 (three) times daily.     Marland Kitchen docusate sodium (COLACE) 100 MG capsule Take 100 mg by mouth 2 (two) times daily.    Marland Kitchen escitalopram (LEXAPRO) 10 MG tablet Take 10 mg by mouth daily.    Marland Kitchen esomeprazole (NEXIUM) 40 MG capsule Take 40 mg by mouth daily at 12 noon.    . famotidine (PEPCID) 20 MG tablet Take  20 mg by mouth 2 (two) times daily.    . ferrous sulfate 325 (65 FE) MG tablet Take 325 mg by mouth daily with breakfast.    . furosemide (LASIX) 80 MG tablet Take 80 mg by mouth daily.    Marland Kitchen gabapentin (NEURONTIN) 300 MG capsule Take 300 mg by mouth 3 (three) times daily.    Marland Kitchen guaiFENesin (MUCINEX) 600 MG 12 hr tablet Take 600 mg by mouth 2 (two) times daily.    Marland Kitchen ipratropium-albuterol (DUONEB) 0.5-2.5 (3) MG/3ML SOLN Take  3 mLs by nebulization every 6 (six) hours as needed (cough, congestion, and wheezing).    Marland Kitchen loratadine (CLARITIN) 10 MG tablet Take 10 mg by mouth daily.    . metFORMIN (GLUCOPHAGE) 500 MG tablet Take 500 mg by mouth 2 (two) times daily.     . metoprolol tartrate (LOPRESSOR) 25 MG tablet Take 25 mg by mouth 2 (two) times daily.    . Multiple Vitamins-Minerals (CERTAGEN PO) Take 1 tablet by mouth daily.    Marland Kitchen olopatadine (PATANOL) 0.1 % ophthalmic solution Place 1 drop into both eyes daily.     . ondansetron (ZOFRAN) 4 MG tablet Take 4 mg by mouth every 6 (six) hours as needed for nausea or vomiting.    Marland Kitchen oxyCODONE-acetaminophen (PERCOCET) 10-325 MG tablet Take one tablet by mouth three times daily for pain. Do not exceed 4gm of Tylenol in 24 hours 90 tablet 0  . oxyCODONE-acetaminophen (PERCOCET) 10-325 MG tablet Take 1 tablet by mouth every 8 (eight) hours as needed for pain. 20 tablet 0  . polyethylene glycol (MIRALAX / GLYCOLAX) packet Take 17 g by mouth daily.    . potassium chloride SA (K-DUR,KLOR-CON) 20 MEQ tablet Take 20 mEq by mouth 2 (two) times daily.    Marland Kitchen Propylene Glycol (SYSTANE BALANCE OP) Apply 1 drop to eye 2 (two) times daily.    . sennosides-docusate sodium (SENOKOT-S) 8.6-50 MG tablet Take 1 tablet by mouth at bedtime.    . simvastatin (ZOCOR) 10 MG tablet Take 10 mg by mouth at bedtime.    . valsartan (DIOVAN) 40 MG tablet Take 1 tablet (40 mg total) by mouth daily. 30 tablet 6  . vitamin C (ASCORBIC ACID) 500 MG tablet Take 500 mg by mouth 2 (two) times daily.    Marland Kitchen zinc oxide (BALMEX) 11.3 % CREA cream Apply 1 application topically daily as needed. Apply to buttocks     No current facility-administered medications for this visit.    Facility-Administered Medications Ordered in Other Visits  Medication Dose Route Frequency Provider Last Rate Last Dose  . hyaluronate sodium (RADIAPLEXRX) gel   Topical Once Eppie Gibson, MD        PHYSICAL EXAMINATION: ECOG PERFORMANCE  STATUS: 1 - Symptomatic but completely ambulatory  Vitals:   11/04/17 1456  BP: 117/84  Pulse: 76  Resp: 20  Temp: 97.9 F (36.6 C)  SpO2: 93%   There were no vitals filed for this visit.  GENERAL:alert, no distress and comfortable, using a wheelchair for ambulation SKIN: skin color, texture, turgor are normal, no rashes or significant lesions EYES: normal, Conjunctiva are pink and non-injected, sclera clear OROPHARYNX:no exudate, no erythema and lips, buccal mucosa, and tongue normal  NECK: supple, thyroid normal size, non-tender, without nodularity LYMPH:  no palpable lymphadenopathy in the cervical, axillary or inguinal LUNGS: clear to auscultation and percussion with normal breathing effort HEART: regular rate & rhythm and no murmurs and no lower extremity edema ABDOMEN:abdomen soft, non-tender and  normal bowel sounds MUSCULOSKELETAL:no cyanosis of digits and no clubbing  NEURO: alert & oriented x 3 with fluent speech, no focal motor/sensory deficits EXTREMITIES: No lower extremity edema  LABORATORY DATA:  I have reviewed the data as listed CMP Latest Ref Rng & Units 06/26/2017 06/25/2017 06/25/2017  Glucose 65 - 99 mg/dL 131(H) - 143(H)  BUN 6 - 20 mg/dL 14 - 16  Creatinine 0.44 - 1.00 mg/dL 0.74 0.72 0.80  Sodium 135 - 145 mmol/L 139 - 140  Potassium 3.5 - 5.1 mmol/L 4.2 - 3.6  Chloride 101 - 111 mmol/L 104 - 99(L)  CO2 22 - 32 mmol/L 27 - 30  Calcium 8.9 - 10.3 mg/dL 8.5(L) - 9.5  Total Protein 6.0 - 8.3 g/dL - - -  Total Bilirubin 0.3 - 1.2 mg/dL - - -  Alkaline Phos 39 - 117 U/L - - -  AST 0 - 37 U/L - - -  ALT 0 - 35 U/L - - -    Lab Results  Component Value Date   WBC 12.5 (H) 06/26/2017   HGB 11.5 (L) 06/26/2017   HCT 36.0 06/26/2017   MCV 98.6 06/26/2017   PLT 270 06/26/2017   NEUTROABS 4.6 01/05/2014    ASSESSMENT & PLAN:  Malignant neoplasm of lower-outer quadrant of left breast of female, estrogen receptor positive ([place]) 06/25/2017: Left breast  modified radical mastectomy: Invasive lobular carcinoma, 10.5 cm, grade 2, tumor involves skin and nipple, 12/13 lymph nodes positive, involves skeletal muscle and adipose tissue, ER 95%, PR 95%, HER-2 negative, Ki-67 25% to 40% T4b N3a stage IIIB AJCC 8  Treatment plan: 1. Ideally patient needs chemotherapy but she is in no shape to receive chemotherapy. Her performance status is poor and she has many comorbidities that makes chemotherapy not an option. 2. adjuvant radiation therapy 08/19/2017-10/01/2017  3. Adjuvant antiestrogen therapy with letrozole 2.5 mg daily started 11/04/2017 --------------------------------------------------------------------------------------------------------------------------------------------- Letrozole counseling: We discussed the risks and benefits of anti-estrogen therapy with aromatase inhibitors. These include but not limited to insomnia, hot flashes, mood changes, vaginal dryness, bone density loss, and weight gain. We strongly believe that the benefits far outweigh the risks. Patient understands these risks and consented to starting treatment. Planned treatment duration is 5-7 years.  Return to clinic in 3 months for survivorship care plan visit and toxicity check  I spent 25 minutes talking to the patient of which more than half was spent in counseling and coordination of care.  No orders of the defined types were placed in this encounter.  The patient has a good understanding of the overall plan. she agrees with it. she will call with any problems that may develop before the next visit here.   Harriette Ohara, MD 11/04/17

## 2017-11-05 ENCOUNTER — Inpatient Hospital Stay: Admission: RE | Admit: 2017-11-05 | Payer: Self-pay | Source: Ambulatory Visit | Admitting: Radiation Oncology

## 2017-11-05 HISTORY — DX: Personal history of irradiation: Z92.3

## 2017-11-19 ENCOUNTER — Ambulatory Visit (HOSPITAL_BASED_OUTPATIENT_CLINIC_OR_DEPARTMENT_OTHER): Payer: Medicare Other | Attending: Internal Medicine | Admitting: Internal Medicine

## 2017-11-19 VITALS — Ht 60.0 in | Wt 207.0 lb

## 2017-11-19 DIAGNOSIS — G4733 Obstructive sleep apnea (adult) (pediatric): Secondary | ICD-10-CM | POA: Insufficient documentation

## 2017-11-19 DIAGNOSIS — G4700 Insomnia, unspecified: Secondary | ICD-10-CM | POA: Insufficient documentation

## 2017-11-19 DIAGNOSIS — R0683 Snoring: Secondary | ICD-10-CM

## 2017-11-23 DIAGNOSIS — R0683 Snoring: Secondary | ICD-10-CM

## 2017-11-23 NOTE — Procedures (Signed)
Patient Name: [name] [name], [name] [name] Date: 11/19/2017 Gender: Female D.O.B: 02-27-1946 Age (years): 71 Referring Provider: Crist Infante FNP Height (inches): 60 Interpreting Physician: Baird Lyons MD, ABSM Weight (lbs): 207 RPSGT: Jorge Ny BMI: 40 MRN: [id number] Neck Size: 18.00 <br> <br> CLINICAL INFORMATION Sleep Study Type: NPSG Indication for sleep study: Congestive Heart Failure, Diabetes, Hypertension, Obesity, OSA, Snoring  Epworth Sleepiness Score: 18  SLEEP STUDY TECHNIQUE As per the AASM Manual for the Scoring of Sleep and Associated Events v2.3 (April 2016) with a hypopnea requiring 4% desaturations.  The channels recorded and monitored were frontal, central and occipital EEG, electrooculogram (EOG), submentalis EMG (chin), nasal and oral airflow, thoracic and abdominal wall motion, anterior tibialis EMG, snore microphone, electrocardiogram, and pulse oximetry.  MEDICATIONS Medications self-administered by patient taken the night of the study : none reported  SLEEP ARCHITECTURE The study was initiated at 10:55:07 PM and ended at 5:00:04 AM.  Sleep onset time was 212.5 minutes and the sleep efficiency was 4.2%. The total sleep time was 15.5 minutes.  Stage REM latency was N/A minutes.  The patient spent 67.74% of the night in stage N1 sleep, 32.26% in stage N2 sleep, 0.00% in stage N3 and 0.00% in REM.  Alpha intrusion was absent.  Supine sleep was 100.00%.  RESPIRATORY PARAMETERS The overall apnea/hypopnea index (AHI) was 7.7 per hour. There were 0 total apneas, including 0 obstructive, 0 central and 0 mixed apneas. There were 2 hypopneas and 0 RERAs.  The AHI during Stage REM sleep was N/A per hour.  AHI while supine was 7.7 per hour.  The mean oxygen saturation was 90.42%. The minimum SpO2 during sleep was 89.00%.  soft snoring was noted during this study.  CARDIAC DATA The 2 lead EKG demonstrated sinus rhythm. The mean heart rate was 77.02  beats per minute. Other EKG findings include: None.  LEG MOVEMENT DATA The total PLMS were 0 with a resulting PLMS index of 0.00. Associated arousal with leg movement index was 0.0 .  IMPRESSIONS - Significant difficulty initiating and maintaining sleep. Sleep latency 212 minutes, with total sleep time only 15.5 minutes. - Mild obstructive sleep apnea occurred during this study (AHI = 7.7/h). - No significant central sleep apnea occurred during this study (CAI = 0.0/h). - The patient had minimal or no oxygen desaturation during the study (Min O2 = 89.00%, Mean 90.4%) - The patient snored with soft snoring volume. - Patient reported to cough and sneeze throughout the night. - No cardiac abnormalities were noted during this study. - Clinically significant periodic limb movements did not occur during sleep. No significant associated arousals. - Patient bed-confied and incontinent. Staffing aide, lift required.  DIAGNOSIS - Obstructive Sleep Apnea (327.23 [G47.33 ICD-10]) - Insomnia  RECOMMENDATIONS - Very mild obstructive sleep apnea- may not be clinically significant. CPAP, oral appliance or chin strap might be appropriate, based on clinical judgment. - Recognize need to address insomnia if this pattern is also seen in home environment. - Sleep hygiene should be reviewed to assess factors that may improve sleep quality. - Weight management and regular exercise should be initiated or continued if appropriate.  [Electronically signed] 11/23/2017 02:49 PM  Baird Lyons MD, [place], American Board of Sleep Medicine   NPI: [id number]                          [place], [place] [facility]  ELECTRONICALLY SIGNED ON:  11/23/2017, 2:42 PM [facility] PH: [phone]   FX: [phone] [place]

## 2017-12-16 ENCOUNTER — Encounter: Payer: Self-pay | Admitting: Radiation Oncology

## 2017-12-16 ENCOUNTER — Ambulatory Visit
Admission: RE | Admit: 2017-12-16 | Discharge: 2017-12-16 | Disposition: A | Discharge status: Home / Self Care | Payer: Medicare Other | Source: Ambulatory Visit | Attending: Radiation Oncology | Admitting: Radiation Oncology

## 2017-12-16 VITALS — BP 151/84 | HR 90 | Temp 98.2°F

## 2017-12-16 DIAGNOSIS — Z170 Estrogen receptor positive status [ER+]: Secondary | ICD-10-CM | POA: Diagnosis not present

## 2017-12-16 DIAGNOSIS — Z7984 Long term (current) use of oral hypoglycemic drugs: Secondary | ICD-10-CM | POA: Diagnosis not present

## 2017-12-16 DIAGNOSIS — Z79899 Other long term (current) drug therapy: Secondary | ICD-10-CM | POA: Diagnosis not present

## 2017-12-16 DIAGNOSIS — Z79811 Long term (current) use of aromatase inhibitors: Secondary | ICD-10-CM | POA: Diagnosis not present

## 2017-12-16 DIAGNOSIS — Z923 Personal history of irradiation: Secondary | ICD-10-CM | POA: Insufficient documentation

## 2017-12-16 DIAGNOSIS — C50512 Malignant neoplasm of lower-outer quadrant of left female breast: Secondary | ICD-10-CM | POA: Insufficient documentation

## 2017-12-16 NOTE — Progress Notes (Signed)
[name] [name] presents for follow up of radiation completed 10/01/17 to her Left Chest. She denies pain. She continues to have two small areas open to her Left Chest. She reports that there is drainage present at times. She is unable to tell me the color of the drainage, though she does say they change her shirt daily because of the drainage. She says her aides are applying neosporin to these two areas and radiaplex to her Left Chest, Left Clavicle area and Left upper back. She is taking Letrozole daily and has a survivorship appointment on 02/10/18.   BP (!) 151/84   Pulse 90   Temp 98.2 F (36.8 C)   SpO2 95% Comment: room air   Wt Readings from Last 3 Encounters:  11/19/17 207 lb (93.9 kg)  06/25/17 208 lb (94.3 kg)  06/13/17 208 lb (94.3 kg)

## 2017-12-16 NOTE — Progress Notes (Signed)
Radiation Oncology         [phone] ________________________________  Name: [name] [name] MRN: [id number]  Date: 12/16/2017  DOB: [birth year]  Follow-Up Visit Note  Outpatient  CC: Patient, No Pcp Per  Nicholas Lose, MD  Diagnosis and Prior Radiotherapy:    ICD-10-CM   1. Malignant neoplasm of lower-outer quadrant of left breast of female, estrogen receptor positive ([place]) C50.512    Z17.0     T4bN3a Stage IIIb  Left Breast LOQ Invasive Lobular Carcinoma, ER(+) 95% / PR(+) 95% / Her2(-), Grade II  Radiation completed 10/01/17  CHIEF COMPLAINT: Here for follow-up and surveillance of her left breast cancer  Narrative:  The patient returns today for   2 month follow-up.  She denies pain or fatigue but continues to have a small open area  to her left chest wall. She denies excessive drainage.  Staff applies neosporin.  ALLERGIES:  is allergic to miconazole.  Meds: Current Outpatient Medications  Medication Sig Dispense Refill  . ADVAIR DISKUS 250-50 MCG/DOSE AEPB Inhale 1 puff into the lungs 2 (two) times daily.  0  . allopurinol (ZYLOPRIM) 300 MG tablet Take 300 mg by mouth daily.    . Amino Acids-Protein Hydrolys (FEEDING SUPPLEMENT, PRO-STAT SUGAR FREE 64,) LIQD Take 30 mLs by mouth 2 (two) times daily.    . Calcium Carbonate (CALTRATE 600 PO) Take 600 mg by mouth 2 (two) times daily.    . Cholecalciferol 50000 units TABS Take 50,000 Units by mouth every 30 (thirty) days. On the 17th for osteoporosis    . cyclobenzaprine (FLEXERIL) 10 MG tablet Take 10 mg by mouth 3 (three) times daily.     Marland Kitchen docusate sodium (COLACE) 100 MG capsule Take 100 mg by mouth 2 (two) times daily.    Marland Kitchen escitalopram (LEXAPRO) 10 MG tablet Take 10 mg by mouth daily.    Marland Kitchen esomeprazole (NEXIUM) 40 MG capsule Take 40 mg by mouth daily at 12 noon.    . famotidine (PEPCID) 20 MG tablet Take 20 mg by mouth 2 (two) times daily.    . ferrous sulfate 325 (65 FE) MG tablet Take 325 mg by mouth daily with  breakfast.    . furosemide (LASIX) 80 MG tablet Take 80 mg by mouth daily.    Marland Kitchen gabapentin (NEURONTIN) 300 MG capsule Take 300 mg by mouth 3 (three) times daily.    Marland Kitchen guaiFENesin (MUCINEX) 600 MG 12 hr tablet Take 600 mg by mouth 2 (two) times daily.    Marland Kitchen ipratropium-albuterol (DUONEB) 0.5-2.5 (3) MG/3ML SOLN Take 3 mLs by nebulization every 6 (six) hours as needed (cough, congestion, and wheezing).    Marland Kitchen letrozole (FEMARA) 2.5 MG tablet Take 1 tablet (2.5 mg total) by mouth daily. 90 tablet 3  . loratadine (CLARITIN) 10 MG tablet Take 10 mg by mouth daily.    . metFORMIN (GLUCOPHAGE) 500 MG tablet Take 500 mg by mouth 2 (two) times daily.     . metoprolol tartrate (LOPRESSOR) 25 MG tablet Take 25 mg by mouth 2 (two) times daily.    . Multiple Vitamins-Minerals (CERTAGEN PO) Take 1 tablet by mouth daily.    Marland Kitchen olopatadine (PATANOL) 0.1 % ophthalmic solution Place 1 drop into both eyes daily.     . ondansetron (ZOFRAN) 4 MG tablet Take 4 mg by mouth every 6 (six) hours as needed for nausea or vomiting.    Marland Kitchen oxyCODONE-acetaminophen (PERCOCET) 10-325 MG tablet Take one tablet by mouth three times daily for pain.  Do not exceed 4gm of Tylenol in 24 hours 90 tablet 0  . oxyCODONE-acetaminophen (PERCOCET) 10-325 MG tablet Take 1 tablet by mouth every 8 (eight) hours as needed for pain. 20 tablet 0  . polyethylene glycol (MIRALAX / GLYCOLAX) packet Take 17 g by mouth daily.    . potassium chloride SA (K-DUR,KLOR-CON) 20 MEQ tablet Take 20 mEq by mouth 2 (two) times daily.    Marland Kitchen Propylene Glycol (SYSTANE BALANCE OP) Apply 1 drop to eye 2 (two) times daily.    . simvastatin (ZOCOR) 10 MG tablet Take 10 mg by mouth at bedtime.    . valsartan (DIOVAN) 40 MG tablet Take 1 tablet (40 mg total) by mouth daily. 30 tablet 6  . vitamin C (ASCORBIC ACID) 500 MG tablet Take 500 mg by mouth 2 (two) times daily.    Marland Kitchen zinc oxide (BALMEX) 11.3 % CREA cream Apply 1 application topically daily as needed. Apply to buttocks      . acetaminophen (TYLENOL) 325 MG tablet Take 325 mg by mouth every 6 (six) hours as needed for moderate pain. Do not exceed 4 gms in 24 hours    . sennosides-docusate sodium (SENOKOT-S) 8.6-50 MG tablet Take 1 tablet by mouth at bedtime.     No current facility-administered medications for this encounter.     Physical Findings: The patient is in no acute distress. Patient is alert and oriented.  temperature is 98.2 F (36.8 C). Her blood pressure is 151/84 (abnormal) and her pulse is 90. Her oxygen saturation is 95%. .    Patient is in a wheel chair. Pleasant affect. No acute distress. CHEST WALL, LEFT: A 2cm area of persistent moist desquamation is appreciated.  This unhealed area is superficial without signs of infection. Much improved from previous exam  Lab Findings: Lab Results  Component Value Date   WBC 12.5 (H) 06/26/2017   HGB 11.5 (L) 06/26/2017   HCT 36.0 06/26/2017   MCV 98.6 06/26/2017   PLT 270 06/26/2017    Radiographic Findings: No results found.  Impression/Plan: healing desquamation    I recommended that her caregivers apply neosporin BID-  TID to the moist desquamation.  I wrote a note to her care facility and we gave her a 46mofollow up to I can check on how she's healing.   Advised care staff to fall if any worsening of skin. _____________________________________   SEppie Gibson MD  This document serves as a record of services personally performed by SEppie Gibson MD. It was created on his behalf by JLinward Natal a trained medical scribe. The creation of this record is based on the scribe's personal observations and the provider's statements to them. This document has been checked and approved by the attending provider.

## 2017-12-17 ENCOUNTER — Ambulatory Visit (INDEPENDENT_AMBULATORY_CARE_PROVIDER_SITE_OTHER): Payer: Medicare Other | Admitting: Cardiovascular Disease

## 2017-12-17 ENCOUNTER — Encounter: Payer: Self-pay | Admitting: Cardiovascular Disease

## 2017-12-17 VITALS — BP 134/78 | HR 71

## 2017-12-17 DIAGNOSIS — I10 Essential (primary) hypertension: Secondary | ICD-10-CM | POA: Diagnosis not present

## 2017-12-17 DIAGNOSIS — I5032 Chronic diastolic (congestive) heart failure: Secondary | ICD-10-CM

## 2017-12-17 DIAGNOSIS — E785 Hyperlipidemia, unspecified: Secondary | ICD-10-CM

## 2017-12-17 NOTE — Progress Notes (Signed)
Cardiology Office Note   Date:  12/17/2017   ID:  [name] [name], DOB 06-Jun-1946, MRN [id number]  PCP:  Patient, No Pcp Per  Cardiologist:   Skeet Latch, MD   Chief Complaint  Patient presents with  . Follow-up    6 months;      History of Present Illness: [name] [name] is a 72 y.o. female with hypertension, PAD, hyperlipidemia, OSA, chronic diastolic heart failure, breast cancer, and Cerebral palsy, who presents for follow up.  She was initially seen in 2016 for an evaluation of shortness of breath.  At that appointment she denied feeling short of breath but did have some lower extremity edema.  Her symptoms were thought to be more to be due to venous stasis than heart failure.  She was sent for labs that revealed a BNP of 24.  Since that appointment she was diagnosed with Stage III breast cancer and completed XRT 09/2017.  She did not receive chemotherapy 2/2 poor functional status and comorbidities.    She had a sleep study 10/2017 that revealed very mild sleep apnea.  A chin strap or CPAP was recommended.  She reports that she has been doing well.  She has no chest pain or edema.  She denies orthopnea or PND.  She gets short of breath when she tries to walk, but no longer walks much 2/2 leg pain and weakness.  She notes that she often worries about her brother who had 4 strokes.    Past Medical History:  Diagnosis Date  . Anemia   . Anxiety   . Bronchitis   . Cerebral palsy ([place])   . Chronic diastolic CHF (congestive heart failure) ([place]) 05/27/2014  . CKD (chronic kidney disease)   . Cough 05/27/2014   ?  ACE cough August, 2015?when d/c'd   . Depression   . DM type 2 with diabetic peripheral neuropathy ([place])   . Dysphagia   . Ejection fraction   . GERD (gastroesophageal reflux disease)   . Glaucoma   . Gout   . History of radiation therapy 08/18/17- 10/01/17   Left Chest wall and regional lymph nodes treated to 50 Gy in 25 fractions followed by a 10 Gy boost in 5 fractions  to the left chest wall scar.   . Hyperaldosteronism ([place])   . Hypercholesterolemia   . Hypertension   . Hypertensive heart disease   . Morbid obesity ([place])   . Neuropathy   . Osteoarthritis   . PAD (peripheral artery disease) ([place])     Past Surgical History:  Procedure Laterality Date  . BLADDER SURGERY    . CLOSED REDUCTION PROXIMAL FIBULAR FRACTURE Left   . LEG SURGERY Left    upper leg  . MASTECTOMY WITH AXILLARY LYMPH NODE DISSECTION Left 06/25/2017   Procedure: LEFT MODIFIED RADICAL MASTECTOMY WITH AXILLARY LYMPH NODE DISSECTION ERAS PATHWAY;  Surgeon: Donnie Mesa, MD;  Location: [facility];  Service: General;  Laterality: Left;     Current Outpatient Medications  Medication Sig Dispense Refill  . acetaminophen (TYLENOL) 325 MG tablet Take 325 mg by mouth every 6 (six) hours as needed for moderate pain. Do not exceed 4 gms in 24 hours    . ADVAIR DISKUS 250-50 MCG/DOSE AEPB Inhale 1 puff into the lungs 2 (two) times daily.  0  . allopurinol (ZYLOPRIM) 300 MG tablet Take 300 mg by mouth daily.    . Amino Acids-Protein Hydrolys (FEEDING SUPPLEMENT, PRO-STAT SUGAR FREE 64,) LIQD Take 30  mLs by mouth 2 (two) times daily.    . Calcium Carbonate (CALTRATE 600 PO) Take 600 mg by mouth 2 (two) times daily.    . Cholecalciferol 50000 units TABS Take 50,000 Units by mouth every 30 (thirty) days. On the 17th for osteoporosis    . cyclobenzaprine (FLEXERIL) 10 MG tablet Take 10 mg by mouth 3 (three) times daily.     Marland Kitchen docusate sodium (COLACE) 100 MG capsule Take 100 mg by mouth 2 (two) times daily.    Marland Kitchen escitalopram (LEXAPRO) 10 MG tablet Take 10 mg by mouth daily.    Marland Kitchen esomeprazole (NEXIUM) 40 MG capsule Take 40 mg by mouth daily at 12 noon.    . famotidine (PEPCID) 20 MG tablet Take 20 mg by mouth 2 (two) times daily.    . ferrous sulfate 325 (65 FE) MG tablet Take 325 mg by mouth daily with breakfast.    . furosemide (LASIX) 80 MG tablet Take 80 mg by mouth daily.    Marland Kitchen gabapentin  (NEURONTIN) 300 MG capsule Take 300 mg by mouth 3 (three) times daily.    Marland Kitchen guaiFENesin (MUCINEX) 600 MG 12 hr tablet Take 600 mg by mouth 2 (two) times daily.    Marland Kitchen ipratropium-albuterol (DUONEB) 0.5-2.5 (3) MG/3ML SOLN Take 3 mLs by nebulization every 6 (six) hours as needed (cough, congestion, and wheezing).    Marland Kitchen letrozole (FEMARA) 2.5 MG tablet Take 1 tablet (2.5 mg total) by mouth daily. 90 tablet 3  . loratadine (CLARITIN) 10 MG tablet Take 10 mg by mouth daily.    Marland Kitchen losartan (COZAAR) 50 MG tablet Take 50 mg by mouth daily.    . metFORMIN (GLUCOPHAGE) 500 MG tablet Take 500 mg by mouth 2 (two) times daily.     . metoprolol tartrate (LOPRESSOR) 25 MG tablet Take 25 mg by mouth 2 (two) times daily.    . Multiple Vitamins-Minerals (CERTAGEN PO) Take 1 tablet by mouth daily.    Marland Kitchen olopatadine (PATANOL) 0.1 % ophthalmic solution Place 1 drop into both eyes daily.     . ondansetron (ZOFRAN) 4 MG tablet Take 4 mg by mouth every 6 (six) hours as needed for nausea or vomiting.    Marland Kitchen oxyCODONE-acetaminophen (PERCOCET) 10-325 MG tablet Take one tablet by mouth three times daily for pain. Do not exceed 4gm of Tylenol in 24 hours 90 tablet 0  . oxyCODONE-acetaminophen (PERCOCET) 10-325 MG tablet Take 1 tablet by mouth every 8 (eight) hours as needed for pain. 20 tablet 0  . polyethylene glycol (MIRALAX / GLYCOLAX) packet Take 17 g by mouth daily.    . potassium chloride SA (K-DUR,KLOR-CON) 20 MEQ tablet Take 20 mEq by mouth 2 (two) times daily.    Marland Kitchen Propylene Glycol (SYSTANE BALANCE OP) Apply 1 drop to eye 2 (two) times daily.    . sennosides-docusate sodium (SENOKOT-S) 8.6-50 MG tablet Take 1 tablet by mouth at bedtime.    . simvastatin (ZOCOR) 10 MG tablet Take 10 mg by mouth at bedtime.    . vitamin C (ASCORBIC ACID) 500 MG tablet Take 500 mg by mouth 2 (two) times daily.    Marland Kitchen zinc oxide (BALMEX) 11.3 % CREA cream Apply 1 application topically daily as needed. Apply to buttocks     No current  facility-administered medications for this visit.     Allergies:   Miconazole    Social History:  The patient  reports that she quit smoking about 14 years ago. Her smoking use included cigarettes.  She has a 80.00 pack-year smoking history. she has never used smokeless tobacco. She reports that she does not drink alcohol or use drugs.   Family History:  The patient's family history includes Heart disease in her father and mother.    ROS:  Please see the history of present illness.   Otherwise, review of systems are positive for hard of hearing.  cough.   All other systems are reviewed and negative.    PHYSICAL EXAM: VS:  BP 134/78   Pulse 71  , BMI There is no height or weight on file to calculate BMI. GENERAL:  Chronically ill-appearing HEENT: Pupils equal round and reactive, fundi not visualized, oral mucosa unremarkable NECK:  No jugular venous distention, waveform within normal limits, carotid upstroke brisk and symmetric, no bruits, no thyromegaly LYMPHATICS:  No cervical adenopathy LUNGS:  Clear to auscultation bilaterally HEART:  RRR.  PMI not displaced or sustained,S1 and S2 within normal limits, no S3, no S4, no clicks, no rubs, no murmurs ABD:  Flat, positive bowel sounds normal in frequency in pitch, no bruits, no rebound, no guarding, no midline pulsatile mass, no hepatomegaly, no splenomegaly EXT:  2 plus pulses throughout, no edema, no cyanosis no clubbing SKIN:  No rashes no nodules NEURO:  Cranial nerves II through XII grossly intact, motor grossly intact throughout PSYCH:  Cognitively intact, oriented to person place and time   EKG:  EKG is ordered today. The ekg ordered 08/16/15 demonstrates sinus rhythm at 78 bpm.  Low voltage. 12/17/17: Sinus rhythm.  Rate 72 bpm.  Low voltage.   Echo 05/27/14: Study Conclusions  - Left ventricle: Technically difficult study. The cavity size was normal. Wall thickness was increased in a pattern of mild LVH. The estimated  ejection fraction was 60%. Regional wall motion abnormalities cannot be excluded. - Left atrium: The atrium was mildly dilated. - Right ventricle: The cavity size was mildly dilated. Systolic function was normal.  Recent Labs: 06/26/2017: BUN 14; Creatinine, Ser 0.74; Hemoglobin 11.5; Platelets 270; Potassium 4.2; Sodium 139   08/18/15: Sodium 139, potassium 4.3, BUN 21, creatinine 0.68 Total cholesterol 111, try glycerides 174, HDL 26, LDL 50 BNP 24.9  Lipid Panel No results found for: CHOL, TRIG, HDL, CHOLHDL, VLDL, LDLCALC, LDLDIRECT    Wt Readings from Last 3 Encounters:  11/19/17 207 lb (93.9 kg)  06/25/17 208 lb (94.3 kg)  06/13/17 208 lb (94.3 kg)      ASSESSMENT AND PLAN:  # Chronic diastolic heart failure: Stable.  She is euvolemic.  Continue lasix.    # Hypertension: Blood pressure is a little above goal.  Valsartan is on recall.  Switch to losartan 50mg  daily.  Check CMP in 1 week.   # Hyperlipidemia: Check fasting lipids in 1 weeks.  Continue simvastatin.   Lipids cmp losartan  Current medicines are reviewed at length with the patient today.  The patient does not have concerns regarding medicines.  The following changes have been made:  no change  Labs/ tests ordered today include:   Orders Placed This Encounter  Procedures  . Lipid panel  . Comprehensive metabolic panel  . EKG 12-Lead     Disposition:   FU with [name] Newson C. Oval Linsey, MD in 3 months     Signed, Skeet Latch, MD  12/17/2017 11:34 AM    [place]

## 2017-12-17 NOTE — Patient Instructions (Signed)
Medication Instructions:  STOP VALSARTAN   START LOSARTAN 50 MG DAILY   Labwork: FASTING LP/CMET IN 1 WEEK   Testing/Procedures: NONE  Follow-Up: Your physician recommends that you schedule a follow-up appointment in: 3 MONTH OV   Any Other Special Instructions Will Be Listed Below (If Applicable).  MONITOR BLOOD PRESSURES DAILY AND CALL THE OFFICE IF BLOOD PRESSURE REMAINING ABOVE 140/90   If you need a refill on your cardiac medications before your next appointment, please call your pharmacy.

## 2018-02-04 ENCOUNTER — Telehealth: Payer: Self-pay

## 2018-02-04 NOTE — Telephone Encounter (Signed)
Spoke with nurse at [facility] concerning patient's SCP visit on 02/10/18 at 2 pm.  Nurse made note.  Has number for call back with questions.

## 2018-02-10 ENCOUNTER — Inpatient Hospital Stay: Payer: Medicare Other | Attending: Adult Health | Admitting: Adult Health

## 2018-02-10 ENCOUNTER — Encounter: Payer: Self-pay | Admitting: Adult Health

## 2018-02-10 VITALS — BP 104/63 | HR 92 | Temp 98.4°F | Resp 16 | Ht 60.0 in

## 2018-02-10 DIAGNOSIS — Z79899 Other long term (current) drug therapy: Secondary | ICD-10-CM | POA: Diagnosis not present

## 2018-02-10 DIAGNOSIS — I5032 Chronic diastolic (congestive) heart failure: Secondary | ICD-10-CM | POA: Diagnosis not present

## 2018-02-10 DIAGNOSIS — C50512 Malignant neoplasm of lower-outer quadrant of left female breast: Secondary | ICD-10-CM | POA: Diagnosis present

## 2018-02-10 DIAGNOSIS — Z7984 Long term (current) use of oral hypoglycemic drugs: Secondary | ICD-10-CM | POA: Diagnosis not present

## 2018-02-10 DIAGNOSIS — I130 Hypertensive heart and chronic kidney disease with heart failure and stage 1 through stage 4 chronic kidney disease, or unspecified chronic kidney disease: Secondary | ICD-10-CM | POA: Diagnosis not present

## 2018-02-10 DIAGNOSIS — Z79811 Long term (current) use of aromatase inhibitors: Secondary | ICD-10-CM | POA: Insufficient documentation

## 2018-02-10 DIAGNOSIS — H409 Unspecified glaucoma: Secondary | ICD-10-CM | POA: Diagnosis not present

## 2018-02-10 DIAGNOSIS — Z9012 Acquired absence of left breast and nipple: Secondary | ICD-10-CM | POA: Diagnosis not present

## 2018-02-10 DIAGNOSIS — M1990 Unspecified osteoarthritis, unspecified site: Secondary | ICD-10-CM | POA: Diagnosis not present

## 2018-02-10 DIAGNOSIS — M109 Gout, unspecified: Secondary | ICD-10-CM | POA: Diagnosis not present

## 2018-02-10 DIAGNOSIS — N189 Chronic kidney disease, unspecified: Secondary | ICD-10-CM | POA: Diagnosis not present

## 2018-02-10 DIAGNOSIS — Z923 Personal history of irradiation: Secondary | ICD-10-CM | POA: Diagnosis not present

## 2018-02-10 DIAGNOSIS — K219 Gastro-esophageal reflux disease without esophagitis: Secondary | ICD-10-CM | POA: Diagnosis not present

## 2018-02-10 DIAGNOSIS — E7800 Pure hypercholesterolemia, unspecified: Secondary | ICD-10-CM | POA: Diagnosis not present

## 2018-02-10 DIAGNOSIS — Z87891 Personal history of nicotine dependence: Secondary | ICD-10-CM | POA: Diagnosis not present

## 2018-02-10 DIAGNOSIS — Z170 Estrogen receptor positive status [ER+]: Secondary | ICD-10-CM | POA: Diagnosis not present

## 2018-02-10 DIAGNOSIS — E2839 Other primary ovarian failure: Secondary | ICD-10-CM

## 2018-02-10 DIAGNOSIS — Z1239 Encounter for other screening for malignant neoplasm of breast: Secondary | ICD-10-CM

## 2018-02-10 NOTE — Progress Notes (Signed)
CLINIC:  Survivorship   REASON FOR VISIT:  Routine follow-up post-treatment for a recent history of breast cancer.  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%, PR 95%, Ki-67 25%, HER-2 negative ratio 0.97, grade 2; lymph node biopsy positive for metastatic cancer, ER 95%, PR 95%, Ki-67 40%, HER-2 negative ratio 1.2; left breast mass 7 irregular 9 cm with nipple retraction inflammatory breast cancer, at least 3 enlarged abnormal lymph nodes, T4d N1 stage IIIa (AJCC 8)      06/25/2017 Surgery    Left breast modified radical mastectomy: Invasive lobular carcinoma, 10.5 cm, grade 2, tumor involves skin and nipple, 12/13 lymph nodes positive, involves skeletal muscle and adipose tissue, ER 95%, PR 95%, HER-2 negative, Ki-67 25% to 40% T4b N3a stage IIIB AJCC 8      08/19/2017 - 10/01/2017 Radiation Therapy    Adj XRT      10/2017 -  Anti-estrogen oral therapy    Letrozole daily       INTERVAL HISTORY:  [name] [name] presents to the [facility] today for our initial meeting to review her survivorship care plan detailing her treatment course for breast cancer, as well as monitoring long-term side effects of that treatment, education regarding health maintenance, screening, and overall wellness and health promotion.     Overall, [name] [name] reports feeling quite well.  She is taking Letrozole daily and reports no side effects such as joint aches, hot flashes, dryness.  She is with her health aide today.    REVIEW OF SYSTEMS:  Review of Systems  Constitutional: Negative for appetite change, chills, fatigue, fever and unexpected weight change.  HENT:   Negative for hearing loss, lump/mass, mouth sores and trouble swallowing.   Eyes: Negative for eye problems and icterus.  Respiratory: Negative for chest  tightness, cough and shortness of breath.   Cardiovascular: Negative for chest pain, leg swelling and palpitations.  Gastrointestinal: Negative for abdominal distention, abdominal pain, constipation, diarrhea, nausea and vomiting.  Endocrine: Negative for hot flashes.  Skin: Negative for itching and rash.  Neurological: Negative for dizziness, extremity weakness, headaches and numbness.  Hematological: Negative for adenopathy. Does not bruise/bleed easily.  Psychiatric/Behavioral: Negative for depression. The patient is not nervous/anxious.   Breast: Denies any new nodularity, masses, tenderness, nipple changes, or nipple discharge.      ONCOLOGY TREATMENT TEAM:  1. Surgeon:  Dr. Georgette Dover at [facility] 2. Medical Oncologist: Dr. Lindi Adie  3. Radiation Oncologist: Dr. Isidore Moos    PAST MEDICAL/SURGICAL HISTORY:  Past Medical History:  Diagnosis Date  . Anemia   . Anxiety   . Bronchitis   . Cerebral palsy ([place])   . Chronic diastolic CHF (congestive heart failure) ([place]) 05/27/2014  . CKD (chronic kidney disease)   . Cough 05/27/2014   ?  ACE cough August, 2015?when d/c'd   . Depression   . DM type 2 with diabetic peripheral neuropathy ([place])   . Dysphagia   . Ejection fraction   . GERD (gastroesophageal reflux disease)   . Glaucoma   . Gout   . History of radiation therapy 08/18/17- 10/01/17   Left Chest wall and regional lymph nodes treated to 50 Gy in 25 fractions followed by a 10 Gy boost in 5 fractions to the left chest wall scar.   . Hyperaldosteronism ([place])   .  Hypercholesterolemia   . Hypertension   . Hypertensive heart disease   . Morbid obesity ([place])   . Neuropathy   . Osteoarthritis   . PAD (peripheral artery disease) ([place])    Past Surgical History:  Procedure Laterality Date  . BLADDER SURGERY    . CLOSED REDUCTION PROXIMAL FIBULAR FRACTURE Left   . LEG SURGERY Left    upper leg  . MASTECTOMY WITH AXILLARY LYMPH NODE DISSECTION Left 06/25/2017    Procedure: LEFT MODIFIED RADICAL MASTECTOMY WITH AXILLARY LYMPH NODE DISSECTION ERAS PATHWAY;  Surgeon: Donnie Mesa, MD;  Location: [facility];  Service: General;  Laterality: Left;     ALLERGIES:  Allergies  Allergen Reactions  . Miconazole     Documented on MAR     CURRENT MEDICATIONS:  Outpatient Encounter Medications as of 02/10/2018  Medication Sig Note  . acetaminophen (TYLENOL) 325 MG tablet Take 325 mg by mouth every 6 (six) hours as needed for moderate pain. Do not exceed 4 gms in 24 hours   . ADVAIR DISKUS 250-50 MCG/DOSE AEPB Inhale 1 puff into the lungs 2 (two) times daily.   Marland Kitchen allopurinol (ZYLOPRIM) 300 MG tablet Take 300 mg by mouth daily.   . Amino Acids-Protein Hydrolys (FEEDING SUPPLEMENT, PRO-STAT SUGAR FREE 64,) LIQD Take 30 mLs by mouth 2 (two) times daily.   . Calcium Carbonate (CALTRATE 600 PO) Take 600 mg by mouth 2 (two) times daily.   . Cholecalciferol 50000 units TABS Take 50,000 Units by mouth every 30 (thirty) days. On the 17th for osteoporosis   . cyclobenzaprine (FLEXERIL) 10 MG tablet Take 10 mg by mouth 3 (three) times daily.    Marland Kitchen docusate sodium (COLACE) 100 MG capsule Take 100 mg by mouth 2 (two) times daily.   Marland Kitchen escitalopram (LEXAPRO) 10 MG tablet Take 10 mg by mouth daily.   Marland Kitchen esomeprazole (NEXIUM) 40 MG capsule Take 40 mg by mouth daily at 12 noon.   . famotidine (PEPCID) 20 MG tablet Take 20 mg by mouth 2 (two) times daily.   . ferrous sulfate 325 (65 FE) MG tablet Take 325 mg by mouth daily with breakfast.   . furosemide (LASIX) 80 MG tablet Take 80 mg by mouth daily.   Marland Kitchen gabapentin (NEURONTIN) 300 MG capsule Take 300 mg by mouth 3 (three) times daily.   Marland Kitchen guaiFENesin (MUCINEX) 600 MG 12 hr tablet Take 600 mg by mouth 2 (two) times daily.   Marland Kitchen ipratropium-albuterol (DUONEB) 0.5-2.5 (3) MG/3ML SOLN Take 3 mLs by nebulization every 6 (six) hours as needed (cough, congestion, and wheezing).   Marland Kitchen letrozole (FEMARA) 2.5 MG tablet Take 1 tablet (2.5 mg  total) by mouth daily.   Marland Kitchen loratadine (CLARITIN) 10 MG tablet Take 10 mg by mouth daily.   Marland Kitchen losartan (COZAAR) 50 MG tablet Take 50 mg by mouth daily.   . metFORMIN (GLUCOPHAGE) 500 MG tablet Take 500 mg by mouth 2 (two) times daily.    . metoprolol tartrate (LOPRESSOR) 25 MG tablet Take 25 mg by mouth 2 (two) times daily.   . Multiple Vitamins-Minerals (CERTAGEN PO) Take 1 tablet by mouth daily.   Marland Kitchen olopatadine (PATANOL) 0.1 % ophthalmic solution Place 1 drop into both eyes daily.    Marland Kitchen oxyCODONE-acetaminophen (PERCOCET) 10-325 MG tablet Take one tablet by mouth three times daily for pain. Do not exceed 4gm of Tylenol in 24 hours   . polyethylene glycol (MIRALAX / GLYCOLAX) packet Take 17 g by mouth daily.   . potassium  chloride SA (K-DUR,KLOR-CON) 20 MEQ tablet Take 20 mEq by mouth 2 (two) times daily.   Marland Kitchen Propylene Glycol (SYSTANE BALANCE OP) Apply 1 drop to eye 2 (two) times daily.   . simvastatin (ZOCOR) 10 MG tablet Take 10 mg by mouth at bedtime.   . vitamin C (ASCORBIC ACID) 500 MG tablet Take 500 mg by mouth 2 (two) times daily.   Marland Kitchen zinc oxide (BALMEX) 11.3 % CREA cream Apply 1 application topically daily as needed. Apply to buttocks 06/24/2017: No indication documented   . ondansetron (ZOFRAN) 4 MG tablet Take 4 mg by mouth every 6 (six) hours as needed for nausea or vomiting.   . sennosides-docusate sodium (SENOKOT-S) 8.6-50 MG tablet Take 1 tablet by mouth at bedtime. 06/25/2017: Nurse states no longer on MAR  . [DISCONTINUED] oxyCODONE-acetaminophen (PERCOCET) 10-325 MG tablet Take 1 tablet by mouth every 8 (eight) hours as needed for pain.    No facility-administered encounter medications on file as of 02/10/2018.      ONCOLOGIC FAMILY HISTORY:  Family History  Problem Relation Age of Onset  . Heart disease Mother   . Heart disease Father        SOCIAL HISTORY:  Social History   Socioeconomic History  . Marital status: Single    Spouse name: Not on file  . Number of  children: Not on file  . Years of education: Not on file  . Highest education level: Not on file  Occupational History  . Not on file  Social Needs  . Financial resource strain: Not on file  . Food insecurity:    Worry: Not on file    Inability: Not on file  . Transportation needs:    Medical: Not on file    Non-medical: Not on file  Tobacco Use  . Smoking status: Former Smoker    Packs/day: 2.00    Years: 40.00    Pack years: 80.00    Types: Cigarettes    Last attempt to quit: 10/22/2003    Years since quitting: 14.3  . Smokeless tobacco: Never Used  Substance and Sexual Activity  . Alcohol use: No  . Drug use: No  . Sexual activity: Never  Lifestyle  . Physical activity:    Days per week: Not on file    Minutes per session: Not on file  . Stress: Not on file  Relationships  . Social connections:    Talks on phone: Not on file    Gets together: Not on file    Attends religious service: Not on file    Active member of club or organization: Not on file    Attends meetings of clubs or organizations: Not on file    Relationship status: Not on file  . Intimate partner violence:    Fear of current or ex partner: Not on file    Emotionally abused: Not on file    Physically abused: Not on file    Forced sexual activity: Not on file  Other Topics Concern  . Not on file  Social History Narrative  . Not on file      PHYSICAL EXAMINATION:  Vital Signs:   Vitals:   02/10/18 1418  BP: 104/63  Pulse: 92  Resp: 16  Temp: 98.4 F (36.9 C)  SpO2: 94%    General: Well-nourished, well-appearing female in no acute distress.  She is accompanied in clinic by a nursing tech from [place] today.  She is in a wheelchair due to her limitations  from her cerebral palsy.  She is unable to get on the exam table today, so her physical exam is limited by this. HEENT: Head is normocephalic.  Pupils equal and reactive to light. Conjunctivae clear without exudate.  Sclerae anicteric.  Oral mucosa is pink, moist.  Oropharynx is pink without lesions or erythema.  Lymph: No cervical, supraclavicular, infraclavicular, or axillary lymphadenopathy noted on palpation.  Cardiovascular: Regular rate and rhythm.Marland Kitchen Respiratory: Clear to auscultation bilaterally. Chest expansion symmetric; breathing non-labored.  Breasts: s/p left mastectomy, no nodularity, mass, or thickening noted, right breast without nodules, masses, skin or nipple changes.  Benign bilateral breast exam GI: Abdomen soft and round; non-tender, non-distended. Bowel sounds normoactive.  GU: Deferred.  Neuro: No focal deficits. Cognitively impaired. Psych: Mood and affect normal and appropriate for situation Extremities: No edema. Skin: Warm and dry.  LABORATORY DATA:  None for this visit.  DIAGNOSTIC IMAGING:  None for this visit.      ASSESSMENT AND PLAN:  Ms.. [name] is a pleasant 72 y.o. 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  She presents to the Survivorship Clinic for our initial meeting and routine follow-up post-completion of treatment for breast cancer.    1. Stage IIIA left breast cancer:  [name] [name] is continuing to recover from definitive treatment for breast cancer. She will follow-up with her medical oncologist, Dr. Lindi Adie in 6 months with history and physical exam per surveillance protocol.  She will continue her anti-estrogen therapy with Letrozole. Thus far, she is tolerating the Letrozole well, with minimal side effects. Today, a comprehensive survivorship care plan and treatment summary was given to the patient today detailing her breast cancer diagnosis, treatment course, potential late/long-term effects of treatment, appropriate follow-up care with recommendations for the future, and patient education resources.     2. Bone health:  Given [name] [name]'s age/history of breast cancer and her current treatment regimen including anti-estrogen therapy with Letrozole, she is at risk for bone demineralization.  She is due for DEXA, and I have ordered this to be done when she has her right breast screening mammogram done in July, 2019.  She was given education on specific activities to promote bone health.  3. Cancer screening:  Due to [name] [name]'s history and her age, she should receive screening for skin cancers, colon cancer, and gynecologic cancers.  The information and recommendations are listed on the patient's comprehensive care plan/treatment summary and were reviewed in detail with the patient and her caregiver.    4. Health maintenance and wellness promotion: [name] [name] was encouraged to consume 5-7 servings of fruits and vegetables per day. We reviewed the "Nutrition Rainbow" handout, as well as the handout "Take Control of Your Health and Reduce Your Cancer Risk" from the [employer].  She was also encouraged to engage in moderate to vigorous exercise for 30 minutes per day most days of the week.        Dispo:   -Return to cancer center in 6 months for f/u with Dr. Lindi Adie  -Mammogram due in 04/2018 -Bone density due in 04/2018 -She is welcome to return back to the Survivorship Clinic at any time; no additional follow-up needed at this time.  -Consider referral back to survivorship as a long-term survivor for continued surveillance  A total of (30) minutes of face-to-face time was spent with this patient with greater than  50% of that time in counseling and care-coordination.   Gardenia Phlegm, NP Survivorship Program [place] [phone]   Note: PRIMARY CARE PROVIDER Patient, No Pcp Per None None

## 2018-02-11 ENCOUNTER — Telehealth: Payer: Self-pay

## 2018-02-11 NOTE — Telephone Encounter (Signed)
Tried calling patient with 6 month Appointment date and time. Per 4/23 los. Will mail calender of this appoiontmnent.

## 2018-02-13 ENCOUNTER — Ambulatory Visit: Payer: Self-pay | Admitting: Radiation Oncology

## 2018-02-18 NOTE — Progress Notes (Addendum)
Ms. Zirbel presents for follow up after completion of radiation 10/01/17 to her Left Breast. She presents for a skin check today. She saw Wilber Bihari in survivorship on 02/10/18 and will see Dr. Lindi Adie next 08/12/18. She continues to take Letrozole without adverse symptoms.   Denies having pain to left breast, skin with scalped area one half dime size no drainage in the past three weeks. Saw Dr. Gershon Crane about a month ago for her breast concern he felt like it was okay with the scalping since there was no drainage and would see her again in 6 months.  The area is cleaned daily at the nursing home with soap and water and kept dry. Gentian violet applied to left breast scalped area telfa applied. Instructions given by Dr. Isidore Moos to staff member from nursing facility on usage of the gentian violet. Wt Readings from Last 3 Encounters:  11/19/17 207 lb (93.9 kg)  06/25/17 208 lb (94.3 kg)  06/13/17 208 lb (94.3 kg)  BP 123/83 (BP Location: Right Arm, Patient Position: Sitting, Cuff Size: Large)   Pulse 93   Temp 98.5 F (36.9 C) (Oral)

## 2018-02-20 ENCOUNTER — Ambulatory Visit
Admission: RE | Admit: 2018-02-20 | Discharge: 2018-02-20 | Disposition: A | Discharge status: Home / Self Care | Payer: Medicare Other | Source: Ambulatory Visit | Attending: Radiation Oncology | Admitting: Radiation Oncology

## 2018-02-20 ENCOUNTER — Encounter: Payer: Self-pay | Admitting: Radiation Oncology

## 2018-02-20 ENCOUNTER — Other Ambulatory Visit: Payer: Self-pay

## 2018-02-20 VITALS — BP 123/83 | HR 93 | Temp 98.5°F | Resp 18 | Ht <= 58 in | Wt 211.0 lb

## 2018-02-20 DIAGNOSIS — Z7984 Long term (current) use of oral hypoglycemic drugs: Secondary | ICD-10-CM | POA: Diagnosis not present

## 2018-02-20 DIAGNOSIS — Z79899 Other long term (current) drug therapy: Secondary | ICD-10-CM | POA: Diagnosis not present

## 2018-02-20 DIAGNOSIS — C50512 Malignant neoplasm of lower-outer quadrant of left female breast: Secondary | ICD-10-CM | POA: Diagnosis not present

## 2018-02-20 DIAGNOSIS — Z170 Estrogen receptor positive status [ER+]: Secondary | ICD-10-CM | POA: Insufficient documentation

## 2018-02-20 DIAGNOSIS — Z923 Personal history of irradiation: Secondary | ICD-10-CM | POA: Insufficient documentation

## 2018-02-20 NOTE — Progress Notes (Signed)
Radiation Oncology         [phone] ________________________________  Name: [name] [name] MRN: [id number]  Date: 02/20/2018  DOB: Feb 14, 1946  Follow-Up Visit Note  Outpatient  CC: Patient, No Pcp Per  Nicholas Lose, MD  Diagnosis and Prior Radiotherapy:    ICD-10-CM   1. Malignant neoplasm of lower-outer quadrant of left breast of female, estrogen receptor positive ([place]) C50.512    Z17.0    T4bN3a Stage IIIb  Left Breast LOQ Invasive Lobular Carcinoma, ER(+) 95% / PR(+) 95% / Her2(-), Grade II  Radiation treatment dates:   08/18/2017 - 10/01/2017 Site/dose:   The left chest wall and regional lymph nodes were treated to 50 Gy in 25 fractions, followed by a 10 Gy boost in 5 fractions to the left chest wall scar.  CHIEF COMPLAINT: Here for follow-up and surveillance of her left breast cancer  Narrative:  The patient returns today for routine follow up of radiation completed 5 months ago to her left chest wall. She presents for a skin check today. She saw Wilber Bihari in survivorship on 02/10/2018 and will see Dr. Lindi Adie next 08/12/2018. She continues to take Letrozole without adverse symptoms.   She denies having pain to left chest wall area. She denies any drainage. She saw Dr. Georgette Dover about a month ago for her breast concern, and he felt like it was okay and would see her again in 6 months. The area is cleaned daily at the nursing home with soap and water and kept dry.   ALLERGIES:  is allergic to miconazole.  Meds: Current Outpatient Medications  Medication Sig Dispense Refill  . acetaminophen (TYLENOL) 325 MG tablet Take 325 mg by mouth every 6 (six) hours as needed for moderate pain. Do not exceed 4 gms in 24 hours    . ADVAIR DISKUS 250-50 MCG/DOSE AEPB Inhale 1 puff into the lungs 2 (two) times daily.  0  . allopurinol (ZYLOPRIM) 300 MG tablet Take 300 mg by mouth daily.    . Amino Acids-Protein Hydrolys (FEEDING SUPPLEMENT, PRO-STAT SUGAR FREE 64,) LIQD Take 30 mLs by  mouth 2 (two) times daily.    . Calcium Carbonate (CALTRATE 600 PO) Take 600 mg by mouth 2 (two) times daily.    . Cholecalciferol 50000 units TABS Take 50,000 Units by mouth every 30 (thirty) days. On the 17th for osteoporosis    . cyclobenzaprine (FLEXERIL) 10 MG tablet Take 10 mg by mouth 3 (three) times daily.     Marland Kitchen docusate sodium (COLACE) 100 MG capsule Take 100 mg by mouth 2 (two) times daily.    Marland Kitchen escitalopram (LEXAPRO) 10 MG tablet Take 10 mg by mouth daily.    Marland Kitchen esomeprazole (NEXIUM) 40 MG capsule Take 40 mg by mouth daily at 12 noon.    . famotidine (PEPCID) 20 MG tablet Take 20 mg by mouth 2 (two) times daily.    . ferrous sulfate 325 (65 FE) MG tablet Take 325 mg by mouth daily with breakfast.    . furosemide (LASIX) 80 MG tablet Take 80 mg by mouth daily.    Marland Kitchen gabapentin (NEURONTIN) 300 MG capsule Take 300 mg by mouth 3 (three) times daily.    Marland Kitchen guaiFENesin (MUCINEX) 600 MG 12 hr tablet Take 600 mg by mouth 2 (two) times daily.    Marland Kitchen ipratropium-albuterol (DUONEB) 0.5-2.5 (3) MG/3ML SOLN Take 3 mLs by nebulization every 6 (six) hours as needed (cough, congestion, and wheezing).    Marland Kitchen letrozole (FEMARA) 2.5 MG  tablet Take 1 tablet (2.5 mg total) by mouth daily. 90 tablet 3  . loratadine (CLARITIN) 10 MG tablet Take 10 mg by mouth daily.    Marland Kitchen losartan (COZAAR) 50 MG tablet Take 50 mg by mouth daily.    . metFORMIN (GLUCOPHAGE) 500 MG tablet Take 500 mg by mouth 2 (two) times daily.     . metoprolol tartrate (LOPRESSOR) 25 MG tablet Take 25 mg by mouth 2 (two) times daily.    . Multiple Vitamins-Minerals (CERTAGEN PO) Take 1 tablet by mouth daily.    Marland Kitchen olopatadine (PATANOL) 0.1 % ophthalmic solution Place 1 drop into both eyes daily.     . ondansetron (ZOFRAN) 4 MG tablet Take 4 mg by mouth every 6 (six) hours as needed for nausea or vomiting.    . polyethylene glycol (MIRALAX / GLYCOLAX) packet Take 17 g by mouth daily.    . potassium chloride SA (K-DUR,KLOR-CON) 20 MEQ tablet Take 20  mEq by mouth 2 (two) times daily.    Marland Kitchen Propylene Glycol (SYSTANE BALANCE OP) Apply 1 drop to eye 2 (two) times daily.    . sennosides-docusate sodium (SENOKOT-S) 8.6-50 MG tablet Take 1 tablet by mouth at bedtime.    Marland Kitchen oxyCODONE-acetaminophen (PERCOCET) 10-325 MG tablet Take one tablet by mouth three times daily for pain. Do not exceed 4gm of Tylenol in 24 hours (Patient not taking: Reported on 02/20/2018) 90 tablet 0  . simvastatin (ZOCOR) 10 MG tablet Take 10 mg by mouth at bedtime.    . vitamin C (ASCORBIC ACID) 500 MG tablet Take 500 mg by mouth 2 (two) times daily.    Marland Kitchen zinc oxide (BALMEX) 11.3 % CREA cream Apply 1 application topically daily as needed. Apply to buttocks     No current facility-administered medications for this encounter.     Physical Findings: The patient is in no acute distress. Patient is alert and oriented.  height is 5" (0.127 m) (abnormal) and weight is 211 lb (95.7 kg). Her oral temperature is 98.5 F (36.9 C). Her blood pressure is 123/83 and her pulse is 93. Her respiration is 18 and oxygen saturation is 100%.   Chest Wall: Her skin has healed nicely over the left chest wall with some residual mild hyperpigmentation. Skin is intact with exception to an area that is 1 cm in dimension over the central/lateral mastectomy scar that is still slightly open and oozing whitish fluid.   Lab Findings: Lab Results  Component Value Date   WBC 12.5 (H) 06/26/2017   HGB 11.5 (L) 06/26/2017   HCT 36.0 06/26/2017   MCV 98.6 06/26/2017   PLT 270 06/26/2017    Radiographic Findings: No results found.  Impression/Plan: Healing from radiotherapy. Superficial open area on scar   I recommended that she apply Gentian violet to the open area twice per day and after bathing. Her skin care caregiver who is present today knows how to use the Gentian violet and they were instructed by nursing about how to apply. If this area does not dry up in the next month, they know to call us.     She will followup with medical oncology and surgical oncology. I will see her back on an as-needed basis. I have encouraged her to call if she has any issues or concerns in the future. I wished her the very best.  _____________________________________   Eppie Gibson, MD  This document serves as a record of services personally performed by Eppie Gibson, MD. It was created  on her behalf by Rae Lips, a trained medical scribe. The creation of this record is based on the scribe's personal observations and the provider's statements to them. This document has been checked and approved by the attending provider.

## 2018-03-18 ENCOUNTER — Encounter: Payer: Self-pay | Admitting: Cardiovascular Disease

## 2018-03-18 ENCOUNTER — Ambulatory Visit (INDEPENDENT_AMBULATORY_CARE_PROVIDER_SITE_OTHER): Payer: Medicare Other | Admitting: Cardiovascular Disease

## 2018-03-18 VITALS — BP 113/82 | HR 77

## 2018-03-18 DIAGNOSIS — I5032 Chronic diastolic (congestive) heart failure: Secondary | ICD-10-CM

## 2018-03-18 DIAGNOSIS — E785 Hyperlipidemia, unspecified: Secondary | ICD-10-CM

## 2018-03-18 DIAGNOSIS — I10 Essential (primary) hypertension: Secondary | ICD-10-CM | POA: Diagnosis not present

## 2018-03-18 DIAGNOSIS — Z5181 Encounter for therapeutic drug level monitoring: Secondary | ICD-10-CM | POA: Diagnosis not present

## 2018-03-18 NOTE — Progress Notes (Signed)
Cardiology Office Note   Date:  03/18/2018   ID:  [name] [name], DOB 12-26-1945, MRN [id number]  PCP:  [name] [name], No Pcp Per  Cardiologist:   Skeet Latch, MD   No chief complaint on file.    History of Present Illness: [name] [name] is a 72 y.o. female with hypertension, PAD, hyperlipidemia, OSA, chronic diastolic heart failure, breast cancer, and Cerebral palsy, who presents for follow up.  She was initially seen in 2016 for an evaluation of shortness of breath.  At that appointment she denied feeling short of breath but did have some lower extremity edema.  Her symptoms were thought to be more to be due to venous stasis than heart failure.  She was sent for labs that revealed a BNP of 24.  She was diagnosed with Stage III breast cancer and completed XRT 09/2017.  She did not receive chemotherapy 2/2 poor functional status and comorbidities.    She had a sleep study 10/2017 that revealed very mild sleep apnea.  A chin strap or CPAP was recommended.  She reports that she has been doing well.  She has no chest pain or edema.  She denies orthopnea or PND.  She gets short of breath when she tries to walk, but no longer walks much 2/2 leg pain and weakness.  She notes that she often worries about her brother who had 4 strokes.   At her last appointment valsartan was switched to losartan due to the national recall.  She reports that she has been doing well.  She has no chest pain or shortness of breath.  She does arm exercises at the facility.  She is unable to ambulate.  She has not noted any lower extremity edema but has had left upper extremity swelling since treatment for her breast cancer.  She denies orthopnea or PND.  She has no exertional symptoms.   Past Medical History:  Diagnosis Date  . Anemia   . Anxiety   . Bronchitis   . Cerebral palsy ([place])   . Chronic diastolic CHF (congestive heart failure) ([place]) 05/27/2014  . CKD (chronic kidney disease)   . Cough 05/27/2014   ?  ACE cough  August, 2015?when d/c'd   . Depression   . DM type 2 with diabetic peripheral neuropathy ([place])   . Dysphagia   . Ejection fraction   . GERD (gastroesophageal reflux disease)   . Glaucoma   . Gout   . History of radiation therapy 08/18/17- 10/01/17   Left Chest wall and regional lymph nodes treated to 50 Gy in 25 fractions followed by a 10 Gy boost in 5 fractions to the left chest wall scar.   . Hyperaldosteronism ([place])   . Hypercholesterolemia   . Hypertension   . Hypertensive heart disease   . Morbid obesity ([place])   . Neuropathy   . Osteoarthritis   . PAD (peripheral artery disease) ([place])     Past Surgical History:  Procedure Laterality Date  . BLADDER SURGERY    . CLOSED REDUCTION PROXIMAL FIBULAR FRACTURE Left   . LEG SURGERY Left    upper leg  . MASTECTOMY WITH AXILLARY LYMPH NODE DISSECTION Left 06/25/2017   Procedure: LEFT MODIFIED RADICAL MASTECTOMY WITH AXILLARY LYMPH NODE DISSECTION ERAS PATHWAY;  Surgeon: Donnie Mesa, MD;  Location: [facility];  Service: General;  Laterality: Left;     Current Outpatient Medications  Medication Sig Dispense Refill  . acetaminophen (TYLENOL) 325 MG tablet Take 325 mg by  mouth every 6 (six) hours as needed for moderate pain. Do not exceed 4 gms in 24 hours    . ADVAIR DISKUS 250-50 MCG/DOSE AEPB Inhale 1 puff into the lungs 2 (two) times daily.  0  . allopurinol (ZYLOPRIM) 300 MG tablet Take 300 mg by mouth daily.    . Amino Acids-Protein Hydrolys (FEEDING SUPPLEMENT, PRO-STAT SUGAR FREE 64,) LIQD Take 30 mLs by mouth 2 (two) times daily.    . Calcium Carbonate (CALTRATE 600 PO) Take 600 mg by mouth 2 (two) times daily.    . Cholecalciferol 50000 units TABS Take 50,000 Units by mouth every 30 (thirty) days. On the 17th for osteoporosis    . cyclobenzaprine (FLEXERIL) 10 MG tablet Take 10 mg by mouth 3 (three) times daily.     Marland Kitchen docusate sodium (COLACE) 100 MG capsule Take 100 mg by mouth 2 (two) times daily.    Marland Kitchen escitalopram (LEXAPRO) 10  MG tablet Take 10 mg by mouth daily.    Marland Kitchen esomeprazole (NEXIUM) 40 MG capsule Take 40 mg by mouth daily at 12 noon.    . famotidine (PEPCID) 20 MG tablet Take 20 mg by mouth 2 (two) times daily.    . ferrous sulfate 325 (65 FE) MG tablet Take 325 mg by mouth daily with breakfast.    . furosemide (LASIX) 80 MG tablet Take 80 mg by mouth daily.    Marland Kitchen gabapentin (NEURONTIN) 300 MG capsule Take 300 mg by mouth 3 (three) times daily.    Marland Kitchen guaiFENesin (MUCINEX) 600 MG 12 hr tablet Take 600 mg by mouth 2 (two) times daily.    Marland Kitchen ipratropium-albuterol (DUONEB) 0.5-2.5 (3) MG/3ML SOLN Take 3 mLs by nebulization every 6 (six) hours as needed (cough, congestion, and wheezing).    Marland Kitchen letrozole (FEMARA) 2.5 MG tablet Take 1 tablet (2.5 mg total) by mouth daily. 90 tablet 3  . loratadine (CLARITIN) 10 MG tablet Take 10 mg by mouth daily.    Marland Kitchen losartan (COZAAR) 50 MG tablet Take 50 mg by mouth daily.    . metFORMIN (GLUCOPHAGE) 500 MG tablet Take 500 mg by mouth 2 (two) times daily.     . metoprolol tartrate (LOPRESSOR) 25 MG tablet Take 25 mg by mouth 2 (two) times daily.    . Multiple Vitamins-Minerals (CERTAGEN PO) Take 1 tablet by mouth daily.    Marland Kitchen olopatadine (PATANOL) 0.1 % ophthalmic solution Place 1 drop into both eyes daily.     . ondansetron (ZOFRAN) 4 MG tablet Take 4 mg by mouth every 6 (six) hours as needed for nausea or vomiting.    Marland Kitchen oxyCODONE-acetaminophen (PERCOCET) 10-325 MG tablet Take one tablet by mouth three times daily for pain. Do not exceed 4gm of Tylenol in 24 hours 90 tablet 0  . polyethylene glycol (MIRALAX / GLYCOLAX) packet Take 17 g by mouth daily.    . potassium chloride SA (K-DUR,KLOR-CON) 20 MEQ tablet Take 20 mEq by mouth 2 (two) times daily.    Marland Kitchen Propylene Glycol (SYSTANE BALANCE OP) Apply 1 drop to eye 2 (two) times daily.    . sennosides-docusate sodium (SENOKOT-S) 8.6-50 MG tablet Take 1 tablet by mouth at bedtime.    . simvastatin (ZOCOR) 10 MG tablet Take 10 mg by mouth at  bedtime.    . vitamin C (ASCORBIC ACID) 500 MG tablet Take 500 mg by mouth 2 (two) times daily.    Marland Kitchen zinc oxide (BALMEX) 11.3 % CREA cream Apply 1 application topically daily as needed.  Apply to buttocks     No current facility-administered medications for this visit.     Allergies:   Miconazole    Social History:  The [name] [name]  reports that she quit smoking about 14 years ago. Her smoking use included cigarettes. She has a 80.00 pack-year smoking history. She has never used smokeless tobacco. She reports that she does not drink alcohol or use drugs.   Family History:  The [name] [name]'s family history includes Heart disease in her father and mother.    ROS:  Please see the history of present illness.   Otherwise, review of systems are positive for hard of hearing.  cough.   All other systems are reviewed and negative.    PHYSICAL EXAM: VS:  BP 113/82   Pulse 77  , BMI There is no height or weight on file to calculate BMI. GENERAL:  Chronically ill-appearing.  Morbidly obese.  HEENT: Pupils equal round and reactive, fundi not visualized, oral mucosa unremarkable NECK:  No jugular venous distention, waveform within normal limits, carotid upstroke brisk and symmetric, no bruits LUNGS:  Clear to auscultation bilaterally HEART:  RRR.  PMI not displaced or sustained,S1 and S2 within normal limits, no S3, no S4, no clicks, no rubs, no murmurs ABD:  Flat, positive bowel sounds normal in frequency in pitch, no bruits, no rebound, no guarding, no midline pulsatile mass, no hepatomegaly, no splenomegaly EXT:  2 plus pulses throughout, trace LE edema, no cyanosis no clubbing.  L UE Lymphedema SKIN:  No rashes no nodules NEURO:  Cranial nerves II through XII grossly intact, motor grossly intact throughout PSYCH:  Cognitively intact, oriented to person place and time   EKG:  EKG is not ordered today. The ekg ordered 08/16/15 demonstrates sinus rhythm at 78 bpm.  Low voltage. 12/17/17: Sinus rhythm.   Rate 72 bpm.  Low voltage.   Echo 08/2017: Study Conclusions  - Left ventricle: There was mild focal basal hypertrophy of the   septum. Systolic function was normal. The estimated ejection   fraction was in the range of 55% to 60%. - Mitral valve: Calcified annulus. - Pulmonary arteries: PA peak pressure: 33 mm Hg (S).  Impressions:  - Extremely limited due to poor sound wave transmission; no apical   or subcostal views; normal LV systolic function; focal wall   motion abnormality cannot be excluded; no pericardial effusion;   doppler suboptimal.  Recent Labs: 06/26/2017: BUN 14; Creatinine, Ser 0.74; Hemoglobin 11.5; Platelets 270; Potassium 4.2; Sodium 139   08/18/15: Sodium 139, potassium 4.3, BUN 21, creatinine 0.68 Total cholesterol 111, try glycerides 174, HDL 26, LDL 50 BNP 24.9  Lipid Panel No results found for: CHOL, TRIG, HDL, CHOLHDL, VLDL, LDLCALC, LDLDIRECT    Wt Readings from Last 3 Encounters:  02/20/18 211 lb (95.7 kg)  11/19/17 207 lb (93.9 kg)  06/25/17 208 lb (94.3 kg)      ASSESSMENT AND PLAN:  # Chronic diastolic heart failure: Stable.  She is euvolemic.  Continue lasix.  Continue losartan and metoprolol.  # Hypertension: Blood pressure is well-controlled.  Continue losartan and metoprolol.  # Hyperlipidemia: Check fasting lipids and CMP at her facility.   Current medicines are reviewed at length with the [name] [name] today.  The [name] [name] does not have concerns regarding medicines.  The following changes have been made:  no change  Labs/ tests ordered today include:   Orders Placed This Encounter  Procedures  . Lipid panel  . Comprehensive metabolic panel  Disposition:   FU with Chabely Norby C. Oval Linsey, MD in 6 months     Signed, Skeet Latch, MD  03/18/2018 10:24 AM    [place]

## 2018-03-18 NOTE — Patient Instructions (Addendum)
Medication Instructions:  Your physician recommends that you continue on your current medications as directed. Please refer to the Current Medication list given to you today.  Labwork: FASTING LP/CMET AT FACILITY SOON   Testing/Procedures: NONE  Follow-Up: Your physician wants you to follow-up in: [place] will receive a reminder letter in the mail two months in advance. If you don't receive a letter, please call our office to schedule the follow-up appointment.  If you need a refill on your cardiac medications before your next appointment, please call your pharmacy.

## 2018-05-11 ENCOUNTER — Ambulatory Visit
Admission: RE | Admit: 2018-05-11 | Discharge: 2018-05-11 | Disposition: A | Discharge status: Home / Self Care | Payer: Medicare Other | Source: Ambulatory Visit | Attending: Adult Health | Admitting: Adult Health

## 2018-05-11 DIAGNOSIS — Z1239 Encounter for other screening for malignant neoplasm of breast: Secondary | ICD-10-CM

## 2018-08-11 ENCOUNTER — Encounter: Payer: Self-pay | Admitting: Pulmonary Disease

## 2018-08-11 ENCOUNTER — Institutional Professional Consult (permissible substitution): Payer: Medicare Other | Admitting: Pulmonary Disease

## 2018-08-11 ENCOUNTER — Ambulatory Visit (INDEPENDENT_AMBULATORY_CARE_PROVIDER_SITE_OTHER): Payer: Medicare Other | Admitting: Pulmonary Disease

## 2018-08-11 VITALS — BP 118/76 | HR 95 | Wt 208.0 lb

## 2018-08-11 DIAGNOSIS — J42 Unspecified chronic bronchitis: Secondary | ICD-10-CM

## 2018-08-11 NOTE — Patient Instructions (Signed)
Orders provided to SNF as follows:  Suspected COPD Known Tobacco abuse --START Spiriva 2.5 mcg 2 inhalations daily --CONTINUE Advair 250-18mcg 1 puff twice daily --CONTINUE albuterol every 4-6 hours as needed for shortness of breath or wheezing.   Follow-up in 6 months   If you experience worsening shortness of breath or wheezing that does not improve, please seek medical attention immediately.  Thank you for choosing [facility] as your health care provider!

## 2018-08-11 NOTE — Progress Notes (Signed)
Synopsis: Referred in 08/11/18 for COPD  Subjective:   PATIENT ID: [name] [name] GENDER: female DOB: 09-Sep-1946, MRN: [id number]   .   Chief Complaint  Patient presents with  . Consult    COPD Consult, increased SOB, wheezing, denies cough, denies chest pain but states her chest feels tight.   [name] [name] is a 72 year old bed confined female pertinent history of cerebral palsy, breast cancer, diastolic heart failure, stage III CKD presents as a new consult for management of COPD. Patient is poor historian and has baseline difficulty communicating.   Reviewed fax from [facility] who referred patient to our clinic for COPD. She reports worsening shortness of breath, productive cough and wheezing. Does not know how long. Patient only able to say symptoms have been occurring for long time.  On Advair 250-50 mcg 1 puff twice a day. Unable to state whether symptoms improve with inhaler. Does not know if anything worsens symptoms.   Split-night study 11/19/2017 demonstrates mild OSA AHI 7.7/h sleep time was 15.5 minutes. However patient does not thinks she wears CPAP.  Remote smoker.  I have personally reviewed patient's past medical/family/social history, allergies, current medications.  Past Medical History:  Diagnosis Date  . Anemia   . Anxiety   . Bronchitis   . Cerebral palsy ([place])   . Chronic diastolic CHF (congestive heart failure) ([place]) 05/27/2014  . CKD (chronic kidney disease)   . Cough 05/27/2014   ?  ACE cough August, 2015?when d/c'd   . Depression   . DM type 2 with diabetic peripheral neuropathy ([place])   . Dysphagia   . Ejection fraction   . GERD (gastroesophageal reflux disease)   . Glaucoma   . Gout   . History of radiation therapy 08/18/17- 10/01/17   Left Chest wall and regional lymph nodes treated to 50 Gy in 25 fractions followed by a 10 Gy boost in 5 fractions to the left chest wall scar.   . Hyperaldosteronism ([place])   .  Hypercholesterolemia   . Hypertension   . Hypertensive heart disease   . Morbid obesity ([place])   . Neuropathy   . Osteoarthritis   . PAD (peripheral artery disease) ([facility])      Family History  Problem Relation Age of Onset  . Heart disease Mother   . Heart disease Father      Social History   Socioeconomic History  . Marital status: Single    Spouse name: Not on file  . Number of children: Not on file  . Years of education: Not on file  . Highest education level: Not on file  Occupational History  . Not on file  Social Needs  . Financial resource strain: Not on file  . Food insecurity:    Worry: Not on file    Inability: Not on file  . Transportation needs:    Medical: Not on file    Non-medical: Not on file  Tobacco Use  . Smoking status: Former Smoker    Packs/day: 2.00    Years: 40.00    Pack years: 80.00    Types: Cigarettes    Last attempt to quit: 10/22/2003    Years since quitting: 14.8  . Smokeless tobacco: Never Used  Substance and Sexual Activity  . Alcohol use: No  . Drug use: No  . Sexual activity: Never  Lifestyle  . Physical activity:    Days per week: Not on file    Minutes per  session: Not on file  . Stress: Not on file  Relationships  . Social connections:    Talks on phone: Not on file    Gets together: Not on file    Attends religious service: Not on file    Active member of club or organization: Not on file    Attends meetings of clubs or organizations: Not on file    Relationship status: Not on file  . Intimate partner violence:    Fear of current or ex partner: Not on file    Emotionally abused: Not on file    Physically abused: Not on file    Forced sexual activity: Not on file  Other Topics Concern  . Not on file  Social History Narrative  . Not on file     Allergies  Allergen Reactions  . Miconazole     Documented on [facility]     Outpatient Medications Prior to Visit  Medication Sig Dispense Refill  . acetaminophen  (TYLENOL) 325 MG tablet Take 325 mg by mouth every 6 (six) hours as needed for moderate pain. Do not exceed 4 gms in 24 hours    . ADVAIR DISKUS 250-50 MCG/DOSE AEPB Inhale 1 puff into the lungs 2 (two) times daily.  0  . allopurinol (ZYLOPRIM) 300 MG tablet Take 300 mg by mouth daily.    . Amino Acids-Protein Hydrolys (FEEDING SUPPLEMENT, PRO-STAT SUGAR FREE 64,) LIQD Take 30 mLs by mouth 2 (two) times daily.    . Calcium Carbonate (CALTRATE 600 PO) Take 600 mg by mouth 2 (two) times daily.    . Cholecalciferol 50000 units TABS Take 50,000 Units by mouth every 30 (thirty) days. On the 17th for osteoporosis    . cyclobenzaprine (FLEXERIL) 10 MG tablet Take 10 mg by mouth 3 (three) times daily.     Marland Kitchen docusate sodium (COLACE) 100 MG capsule Take 100 mg by mouth 2 (two) times daily.    Marland Kitchen escitalopram (LEXAPRO) 10 MG tablet Take 10 mg by mouth daily.    Marland Kitchen esomeprazole (NEXIUM) 40 MG capsule Take 40 mg by mouth daily at 12 noon.    . famotidine (PEPCID) 20 MG tablet Take 20 mg by mouth 2 (two) times daily.    . ferrous sulfate 325 (65 FE) MG tablet Take 325 mg by mouth daily with breakfast.    . furosemide (LASIX) 80 MG tablet Take 80 mg by mouth daily.    Marland Kitchen gabapentin (NEURONTIN) 300 MG capsule Take 300 mg by mouth 3 (three) times daily.    Marland Kitchen guaiFENesin (MUCINEX) 600 MG 12 hr tablet Take 600 mg by mouth 2 (two) times daily.    Marland Kitchen ipratropium-albuterol (DUONEB) 0.5-2.5 (3) MG/3ML SOLN Take 3 mLs by nebulization every 6 (six) hours as needed (cough, congestion, and wheezing).    Marland Kitchen letrozole (FEMARA) 2.5 MG tablet Take 1 tablet (2.5 mg total) by mouth daily. 90 tablet 3  . loratadine (CLARITIN) 10 MG tablet Take 10 mg by mouth daily.    Marland Kitchen losartan (COZAAR) 50 MG tablet Take 50 mg by mouth daily.    . metFORMIN (GLUCOPHAGE) 500 MG tablet Take 500 mg by mouth 2 (two) times daily.     . metoprolol tartrate (LOPRESSOR) 25 MG tablet Take 25 mg by mouth 2 (two) times daily.    . Multiple Vitamins-Minerals  (CERTAGEN PO) Take 1 tablet by mouth daily.    Marland Kitchen olopatadine (PATANOL) 0.1 % ophthalmic solution Place 1 drop into both eyes daily.     Marland Kitchen  ondansetron (ZOFRAN) 4 MG tablet Take 4 mg by mouth every 6 (six) hours as needed for nausea or vomiting.    Marland Kitchen oxyCODONE-acetaminophen (PERCOCET) 10-325 MG tablet Take one tablet by mouth three times daily for pain. Do not exceed 4gm of Tylenol in 24 hours 90 tablet 0  . polyethylene glycol (MIRALAX / GLYCOLAX) packet Take 17 g by mouth daily.    . potassium chloride SA (K-DUR,KLOR-CON) 20 MEQ tablet Take 20 mEq by mouth 2 (two) times daily.    Marland Kitchen Propylene Glycol (SYSTANE BALANCE OP) Apply 1 drop to eye 2 (two) times daily.    . sennosides-docusate sodium (SENOKOT-S) 8.6-50 MG tablet Take 1 tablet by mouth at bedtime.    . simvastatin (ZOCOR) 10 MG tablet Take 10 mg by mouth at bedtime.    . vitamin C (ASCORBIC ACID) 500 MG tablet Take 500 mg by mouth 2 (two) times daily.    Marland Kitchen zinc oxide (BALMEX) 11.3 % CREA cream Apply 1 application topically daily as needed. Apply to buttocks     No facility-administered medications prior to visit.     Review of Systems  Constitutional: Negative for chills and fever.  HENT: Negative for congestion.   Respiratory: Positive for cough, sputum production, shortness of breath and wheezing. Negative for hemoptysis.   Cardiovascular: Negative for chest pain and leg swelling.  Gastrointestinal: Negative for heartburn and nausea.  Musculoskeletal: Negative for myalgias.  Skin: Negative for rash.  Neurological: Negative for dizziness and headaches.  Endo/Heme/Allergies: Does not bruise/bleed easily.  Psychiatric/Behavioral: The patient is not nervous/anxious.   All other systems reviewed and are negative.     Objective:  Physical Exam   Vitals:   08/11/18 1105  BP: 118/76  Pulse: 95  SpO2: 92%  Weight: 208 lb (94.3 kg)   Gen: follows commands, no acute distress HENT: NCAT, OP clear, neck supple without  masses Eyes: PERRL, EOMi PULM: Decreased air movement bilateral, mild expiratory wheeze in lower bases CV: RRR, no mgr, no JVD GI: BS+, soft, nontender, no hsm Derm: no rash or skin breakdown MSK: normal bulk and tone Neuro: A&Ox4, CN II-XII intact, strength 5/5 in all 4 extremities   CBC    Component Value Date/Time   WBC 12.5 (H) 06/26/2017 0529   RBC 3.65 (L) 06/26/2017 0529   HGB 11.5 (L) 06/26/2017 0529   HCT 36.0 06/26/2017 0529   PLT 270 06/26/2017 0529   MCV 98.6 06/26/2017 0529   MCH 31.5 06/26/2017 0529   MCHC 31.9 06/26/2017 0529   RDW 14.9 06/26/2017 0529   LYMPHSABS 2.3 01/05/2014 1720   MONOABS 1.1 (H) 01/05/2014 1720   EOSABS 0.7 01/05/2014 1720   BASOSABS 0.0 01/05/2014 1720     Chest imaging: CT chest 06/20/2017: Respiratory motion present.  Basilar atelectasis otherwise unremarkable.  No evidence of reticular changes.  PFT: None on file  I have personally reviewed the above labs, images and tests noted above.    Assessment & Plan:   Chronic bronchitis, unspecified chronic bronchitis type [facility])  Discussion: 72 year old female with cerebral palsy and history of tobacco abuse who presents as new consult for management of suspected COPD.  Unable to obtain clear history as patient has poor historian.  Office attempted to contact nursing facility and brother of patient to clarify medical concerns.  Patient would be poor candidate for pulmonary function tests to confirm obstructive lung disease.  We will plan to treat for presumed COPD with bronchodilators.  Suspected COPD Known Tobacco abuse --  START Spiriva 2.5 mcg 2 inhalations daily --CONTINUE Advair 250-20mcg 1 puff twice daily --CONTINUE albuterol every 4-6 hours as needed for shortness of breath or wheezing.   Follow-up in 6 months  Thank you for choosing [facility] for your health needs!   Chi Rodman Pickle, MD [facility] 08/11/2018 2:00 PM  Personal pager:  [phone] If unanswered, please page [facility] On-call: [phone]    Current Outpatient Medications:  .  acetaminophen (TYLENOL) 325 MG tablet, Take 325 mg by mouth every 6 (six) hours as needed for moderate pain. Do not exceed 4 gms in 24 hours, Disp: , Rfl:  .  ADVAIR DISKUS 250-50 MCG/DOSE AEPB, Inhale 1 puff into the lungs 2 (two) times daily., Disp: , Rfl: 0 .  allopurinol (ZYLOPRIM) 300 MG tablet, Take 300 mg by mouth daily., Disp: , Rfl:  .  Amino Acids-Protein Hydrolys (FEEDING SUPPLEMENT, PRO-STAT SUGAR FREE 64,) LIQD, Take 30 mLs by mouth 2 (two) times daily., Disp: , Rfl:  .  Calcium Carbonate (CALTRATE 600 PO), Take 600 mg by mouth 2 (two) times daily., Disp: , Rfl:  .  Cholecalciferol 50000 units TABS, Take 50,000 Units by mouth every 30 (thirty) days. On the 17th for osteoporosis, Disp: , Rfl:  .  cyclobenzaprine (FLEXERIL) 10 MG tablet, Take 10 mg by mouth 3 (three) times daily. , Disp: , Rfl:  .  docusate sodium (COLACE) 100 MG capsule, Take 100 mg by mouth 2 (two) times daily., Disp: , Rfl:  .  escitalopram (LEXAPRO) 10 MG tablet, Take 10 mg by mouth daily., Disp: , Rfl:  .  esomeprazole (NEXIUM) 40 MG capsule, Take 40 mg by mouth daily at 12 noon., Disp: , Rfl:  .  famotidine (PEPCID) 20 MG tablet, Take 20 mg by mouth 2 (two) times daily., Disp: , Rfl:  .  ferrous sulfate 325 (65 FE) MG tablet, Take 325 mg by mouth daily with breakfast., Disp: , Rfl:  .  furosemide (LASIX) 80 MG tablet, Take 80 mg by mouth daily., Disp: , Rfl:  .  gabapentin (NEURONTIN) 300 MG capsule, Take 300 mg by mouth 3 (three) times daily., Disp: , Rfl:  .  guaiFENesin (MUCINEX) 600 MG 12 hr tablet, Take 600 mg by mouth 2 (two) times daily., Disp: , Rfl:  .  ipratropium-albuterol (DUONEB) 0.5-2.5 (3) MG/3ML SOLN, Take 3 mLs by nebulization every 6 (six) hours as needed (cough, congestion, and wheezing)., Disp: , Rfl:  .  letrozole (FEMARA) 2.5 MG tablet, Take 1 tablet (2.5 mg total) by mouth daily.,  Disp: 90 tablet, Rfl: 3 .  loratadine (CLARITIN) 10 MG tablet, Take 10 mg by mouth daily., Disp: , Rfl:  .  losartan (COZAAR) 50 MG tablet, Take 50 mg by mouth daily., Disp: , Rfl:  .  metFORMIN (GLUCOPHAGE) 500 MG tablet, Take 500 mg by mouth 2 (two) times daily. , Disp: , Rfl:  .  metoprolol tartrate (LOPRESSOR) 25 MG tablet, Take 25 mg by mouth 2 (two) times daily., Disp: , Rfl:  .  Multiple Vitamins-Minerals (CERTAGEN PO), Take 1 tablet by mouth daily., Disp: , Rfl:  .  olopatadine (PATANOL) 0.1 % ophthalmic solution, Place 1 drop into both eyes daily. , Disp: , Rfl:  .  ondansetron (ZOFRAN) 4 MG tablet, Take 4 mg by mouth every 6 (six) hours as needed for nausea or vomiting., Disp: , Rfl:  .  oxyCODONE-acetaminophen (PERCOCET) 10-325 MG tablet, Take one tablet by mouth three times daily for pain. Do  not exceed 4gm of Tylenol in 24 hours, Disp: 90 tablet, Rfl: 0 .  polyethylene glycol (MIRALAX / GLYCOLAX) packet, Take 17 g by mouth daily., Disp: , Rfl:  .  potassium chloride SA (K-DUR,KLOR-CON) 20 MEQ tablet, Take 20 mEq by mouth 2 (two) times daily., Disp: , Rfl:  .  Propylene Glycol (SYSTANE BALANCE OP), Apply 1 drop to eye 2 (two) times daily., Disp: , Rfl:  .  sennosides-docusate sodium (SENOKOT-S) 8.6-50 MG tablet, Take 1 tablet by mouth at bedtime., Disp: , Rfl:  .  simvastatin (ZOCOR) 10 MG tablet, Take 10 mg by mouth at bedtime., Disp: , Rfl:  .  vitamin C (ASCORBIC ACID) 500 MG tablet, Take 500 mg by mouth 2 (two) times daily., Disp: , Rfl:  .  zinc oxide (BALMEX) 11.3 % CREA cream, Apply 1 application topically daily as needed. Apply to buttocks, Disp: , Rfl:

## 2018-08-11 NOTE — Progress Notes (Signed)
Several attempts made to contact facility to get more information regarding patient. Letter sent with patient requesting that patient have an advocate for future appointments.

## 2018-08-12 ENCOUNTER — Inpatient Hospital Stay: Payer: Medicare Other | Attending: Hematology and Oncology | Admitting: Hematology and Oncology

## 2018-08-12 ENCOUNTER — Telehealth: Payer: Self-pay | Admitting: Hematology and Oncology

## 2018-08-12 ENCOUNTER — Institutional Professional Consult (permissible substitution): Payer: Medicare Other | Admitting: Pulmonary Disease

## 2018-08-12 DIAGNOSIS — Z79811 Long term (current) use of aromatase inhibitors: Secondary | ICD-10-CM | POA: Insufficient documentation

## 2018-08-12 DIAGNOSIS — Z9012 Acquired absence of left breast and nipple: Secondary | ICD-10-CM | POA: Insufficient documentation

## 2018-08-12 DIAGNOSIS — C50512 Malignant neoplasm of lower-outer quadrant of left female breast: Secondary | ICD-10-CM | POA: Diagnosis present

## 2018-08-12 DIAGNOSIS — Z170 Estrogen receptor positive status [ER+]: Secondary | ICD-10-CM | POA: Diagnosis not present

## 2018-08-12 DIAGNOSIS — Z993 Dependence on wheelchair: Secondary | ICD-10-CM | POA: Diagnosis not present

## 2018-08-12 DIAGNOSIS — Z923 Personal history of irradiation: Secondary | ICD-10-CM | POA: Diagnosis not present

## 2018-08-12 NOTE — Telephone Encounter (Signed)
Gave patient avs and calendar.   °

## 2018-08-12 NOTE — Assessment & Plan Note (Signed)
06/25/2017:Left breast modified radical mastectomy: Invasive lobular carcinoma, 10.5 cm, grade 2, tumor involves skin and nipple, 12/13 lymph nodes positive, involves skeletal muscle and adipose tissue, ER 95%, PR 95%, HER-2 negative, Ki-67 25% to 40% T4b N3a stage IIIB AJCC 8  Treatment plan: 1.Ideally patient needs chemotherapy but she is in no shape to receive chemotherapy. Her performance status is poor and she has many comorbidities that makes chemotherapy not an option. 2.adjuvant radiation therapy 08/19/2017-10/01/2017  3.Adjuvant antiestrogen therapy with letrozole 2.5 mg daily started 11/04/2017 --------------------------------------------------------------------------------------------------------------------------------------------- Letrozole toxicities:  Breast cancer surveillance: 1.  Mammogram 05/12/2018: Benign, breast density category C 2. breast exam July 2019: Benign Return to clinic in 1 year for follow-up

## 2018-08-12 NOTE — Progress Notes (Signed)
Patient Care Team: Patient, No Pcp Per as PCP - General (General Practice) Nicholas Lose, MD as Consulting Physician (Hematology and Oncology) Donnie Mesa, MD as Consulting Physician (General Surgery) Eppie Gibson, MD as Attending Physician (Radiation Oncology) Gardenia Phlegm, NP as Nurse Practitioner (Hematology and Oncology)  DIAGNOSIS:  Encounter Diagnosis  Name Primary?  . Malignant neoplasm of lower-outer quadrant of left breast of female, estrogen receptor positive ([place])     SUMMARY OF ONCOLOGIC HISTORY:   Malignant neoplasm of lower-outer quadrant of left breast of female, estrogen receptor positive ([place])   05/08/2017 Initial Diagnosis    Left breast biopsy 3:30 position: Invasive lobular carcinoma ER 95%, PR 95%, Ki-67 25%, HER-2 negative ratio 0.97, grade 2; lymph node biopsy positive for metastatic cancer, ER 95%, PR 95%, Ki-67 40%, HER-2 negative ratio 1.2; left breast mass 7 irregular 9 cm with nipple retraction inflammatory breast cancer, at least 3 enlarged abnormal lymph nodes, T4d N1 stage IIIa (AJCC 8)    06/25/2017 Surgery    Left breast modified radical mastectomy: Invasive lobular carcinoma, 10.5 cm, grade 2, tumor involves skin and nipple, 12/13 lymph nodes positive, involves skeletal muscle and adipose tissue, ER 95%, PR 95%, HER-2 negative, Ki-67 25% to 40% T4b N3a stage IIIB AJCC 8    08/19/2017 - 10/01/2017 Radiation Therapy    Adj XRT    10/2017 -  Anti-estrogen oral therapy    Letrozole daily     CHIEF COMPLIANT: Follow-up on letrozole therapy  INTERVAL HISTORY: [name] [name] is a 72 year old with above-mentioned his left breast cancer treated with mastectomy radiation is currently on letrozole therapy.  Unfortunately the list of medication that she provided does not have letrozole on them.  She does not know if she is getting letrozole.  The nursing aide who came along with her does not know either.  She denies any pain or discomfort in the  breast.  REVIEW OF SYSTEMS:   Constitutional: Denies fevers, chills or abnormal weight loss Eyes: Denies blurriness of vision Ears, nose, mouth, throat, and face: Denies mucositis or sore throat Respiratory: Denies cough, dyspnea or wheezes Cardiovascular: Denies palpitation, chest discomfort Gastrointestinal:  Denies nausea, heartburn or change in bowel habits Skin: Denies abnormal skin rashes Lymphatics: Denies new lymphadenopathy or easy bruising Neurological:Denies numbness, tingling or new weaknesses Behavioral/Psych: Mood is stable, no new changes  Extremities: No lower extremity edema Breast: Left mastectomy All other systems were reviewed with the patient and are negative.  I have reviewed the past medical history, past surgical history, social history and family history with the patient and they are unchanged from previous note.  ALLERGIES:  is allergic to miconazole.  MEDICATIONS:  Current Outpatient Medications  Medication Sig Dispense Refill  . acetaminophen (TYLENOL) 325 MG tablet Take 325 mg by mouth every 6 (six) hours as needed for moderate pain. Do not exceed 4 gms in 24 hours    . ADVAIR DISKUS 250-50 MCG/DOSE AEPB Inhale 1 puff into the lungs 2 (two) times daily.  0  . allopurinol (ZYLOPRIM) 300 MG tablet Take 300 mg by mouth daily.    . Amino Acids-Protein Hydrolys (FEEDING SUPPLEMENT, PRO-STAT SUGAR FREE 64,) LIQD Take 30 mLs by mouth 2 (two) times daily.    . Calcium Carbonate (CALTRATE 600 PO) Take 600 mg by mouth 2 (two) times daily.    . Cholecalciferol 50000 units TABS Take 50,000 Units by mouth every 30 (thirty) days. On the 17th for osteoporosis    . cyclobenzaprine (  FLEXERIL) 10 MG tablet Take 10 mg by mouth 3 (three) times daily.     Marland Kitchen docusate sodium (COLACE) 100 MG capsule Take 100 mg by mouth 2 (two) times daily.    Marland Kitchen escitalopram (LEXAPRO) 10 MG tablet Take 10 mg by mouth daily.    Marland Kitchen esomeprazole (NEXIUM) 40 MG capsule Take 40 mg by mouth daily at 12  noon.    . famotidine (PEPCID) 20 MG tablet Take 20 mg by mouth 2 (two) times daily.    . ferrous sulfate 325 (65 FE) MG tablet Take 325 mg by mouth daily with breakfast.    . furosemide (LASIX) 80 MG tablet Take 80 mg by mouth daily.    Marland Kitchen gabapentin (NEURONTIN) 300 MG capsule Take 300 mg by mouth 3 (three) times daily.    Marland Kitchen guaiFENesin (MUCINEX) 600 MG 12 hr tablet Take 600 mg by mouth 2 (two) times daily.    Marland Kitchen ipratropium-albuterol (DUONEB) 0.5-2.5 (3) MG/3ML SOLN Take 3 mLs by nebulization every 6 (six) hours as needed (cough, congestion, and wheezing).    Marland Kitchen letrozole (FEMARA) 2.5 MG tablet Take 1 tablet (2.5 mg total) by mouth daily. 90 tablet 3  . loratadine (CLARITIN) 10 MG tablet Take 10 mg by mouth daily.    Marland Kitchen losartan (COZAAR) 50 MG tablet Take 50 mg by mouth daily.    . metFORMIN (GLUCOPHAGE) 500 MG tablet Take 500 mg by mouth 2 (two) times daily.     . metoprolol tartrate (LOPRESSOR) 25 MG tablet Take 25 mg by mouth 2 (two) times daily.    . Multiple Vitamins-Minerals (CERTAGEN PO) Take 1 tablet by mouth daily.    Marland Kitchen olopatadine (PATANOL) 0.1 % ophthalmic solution Place 1 drop into both eyes daily.     . ondansetron (ZOFRAN) 4 MG tablet Take 4 mg by mouth every 6 (six) hours as needed for nausea or vomiting.    Marland Kitchen oxyCODONE-acetaminophen (PERCOCET) 10-325 MG tablet Take one tablet by mouth three times daily for pain. Do not exceed 4gm of Tylenol in 24 hours 90 tablet 0  . polyethylene glycol (MIRALAX / GLYCOLAX) packet Take 17 g by mouth daily.    . potassium chloride SA (K-DUR,KLOR-CON) 20 MEQ tablet Take 20 mEq by mouth 2 (two) times daily.    Marland Kitchen Propylene Glycol (SYSTANE BALANCE OP) Apply 1 drop to eye 2 (two) times daily.    . sennosides-docusate sodium (SENOKOT-S) 8.6-50 MG tablet Take 1 tablet by mouth at bedtime.    . simvastatin (ZOCOR) 10 MG tablet Take 10 mg by mouth at bedtime.    . vitamin C (ASCORBIC ACID) 500 MG tablet Take 500 mg by mouth 2 (two) times daily.    Marland Kitchen zinc  oxide (BALMEX) 11.3 % CREA cream Apply 1 application topically daily as needed. Apply to buttocks     No current facility-administered medications for this visit.     PHYSICAL EXAMINATION: ECOG PERFORMANCE STATUS: 1 - Symptomatic but completely ambulatory  Vitals:   08/12/18 1014  BP: 101/74  Pulse: 84  Resp: 18  Temp: 98.3 F (36.8 C)  SpO2: 95%   Filed Weights    GENERAL:alert, no distress and comfortable SKIN: skin color, texture, turgor are normal, no rashes or significant lesions EYES: normal, Conjunctiva are pink and non-injected, sclera clear OROPHARYNX:no exudate, no erythema and lips, buccal mucosa, and tongue normal  NECK: supple, thyroid normal size, non-tender, without nodularity LYMPH:  no palpable lymphadenopathy in the cervical, axillary or inguinal LUNGS: clear to  auscultation and percussion with normal breathing effort HEART: regular rate & rhythm and no murmurs and no lower extremity edema ABDOMEN:abdomen soft, non-tender and normal bowel sounds MUSCULOSKELETAL:no cyanosis of digits and no clubbing  NEURO: alert & oriented x 3 with fluent speech, no focal motor/sensory deficits EXTREMITIES: No lower extremity edema   LABORATORY DATA:  I have reviewed the data as listed CMP Latest Ref Rng & Units 06/26/2017 06/25/2017 06/25/2017  Glucose 65 - 99 mg/dL 131(H) - 143(H)  BUN 6 - 20 mg/dL 14 - 16  Creatinine 0.44 - 1.00 mg/dL 0.74 0.72 0.80  Sodium 135 - 145 mmol/L 139 - 140  Potassium 3.5 - 5.1 mmol/L 4.2 - 3.6  Chloride 101 - 111 mmol/L 104 - 99(L)  CO2 22 - 32 mmol/L 27 - 30  Calcium 8.9 - 10.3 mg/dL 8.5(L) - 9.5  Total Protein 6.0 - 8.3 g/dL - - -  Total Bilirubin 0.3 - 1.2 mg/dL - - -  Alkaline Phos 39 - 117 U/L - - -  AST 0 - 37 U/L - - -  ALT 0 - 35 U/L - - -    Lab Results  Component Value Date   WBC 12.5 (H) 06/26/2017   HGB 11.5 (L) 06/26/2017   HCT 36.0 06/26/2017   MCV 98.6 06/26/2017   PLT 270 06/26/2017   NEUTROABS 4.6 01/05/2014     ASSESSMENT & PLAN:  Malignant neoplasm of lower-outer quadrant of left breast of female, estrogen receptor positive ([place]) 06/25/2017:Left breast modified radical mastectomy: Invasive lobular carcinoma, 10.5 cm, grade 2, tumor involves skin and nipple, 12/13 lymph nodes positive, involves skeletal muscle and adipose tissue, ER 95%, PR 95%, HER-2 negative, Ki-67 25% to 40% T4b N3a stage IIIB AJCC 8  Treatment plan: 1.Ideally patient needs chemotherapy but she is in no shape to receive chemotherapy. Her performance status is poor and she has many comorbidities that makes chemotherapy not an option. 2.adjuvant radiation therapy 08/19/2017-10/01/2017  3.Adjuvant antiestrogen therapy with letrozole 2.5 mg daily started 11/04/2017 --------------------------------------------------------------------------------------------------------------------------------------------- Letrozole toxicities: Patient is wheelchair-bound and lives in the nursing home and she does not remember if she is getting the medication.  I reviewed her list of medications and letrozole is not in those list. We will call the nursing facility and talk to them about it.  Breast cancer surveillance: 1.  Mammogram 05/12/2018: Benign, breast density category C 2. breast exam July 2019: Benign Return to clinic in 1 year for follow-up    No orders of the defined types were placed in this encounter.  The patient has a good understanding of the overall plan. she agrees with it. she will call with any problems that may develop before the next visit here.   Harriette Ohara, MD 08/12/18

## 2018-09-22 ENCOUNTER — Ambulatory Visit (INDEPENDENT_AMBULATORY_CARE_PROVIDER_SITE_OTHER): Payer: Medicare Other | Admitting: Cardiovascular Disease

## 2018-09-22 ENCOUNTER — Ambulatory Visit (HOSPITAL_COMMUNITY)
Admission: RE | Admit: 2018-09-22 | Discharge: 2018-09-22 | Disposition: A | Discharge status: Home / Self Care | Payer: Medicare Other | Source: Ambulatory Visit | Attending: Cardiovascular Disease | Admitting: Cardiovascular Disease

## 2018-09-22 ENCOUNTER — Encounter (HOSPITAL_COMMUNITY): Payer: Self-pay

## 2018-09-22 ENCOUNTER — Other Ambulatory Visit: Payer: Self-pay | Admitting: *Deleted

## 2018-09-22 ENCOUNTER — Encounter: Payer: Self-pay | Admitting: Cardiovascular Disease

## 2018-09-22 VITALS — BP 98/72 | HR 74 | Ht 60.0 in

## 2018-09-22 DIAGNOSIS — M7989 Other specified soft tissue disorders: Secondary | ICD-10-CM | POA: Diagnosis not present

## 2018-09-22 DIAGNOSIS — E785 Hyperlipidemia, unspecified: Secondary | ICD-10-CM

## 2018-09-22 DIAGNOSIS — R059 Cough, unspecified: Secondary | ICD-10-CM

## 2018-09-22 DIAGNOSIS — I10 Essential (primary) hypertension: Secondary | ICD-10-CM

## 2018-09-22 DIAGNOSIS — R062 Wheezing: Secondary | ICD-10-CM | POA: Diagnosis not present

## 2018-09-22 DIAGNOSIS — Z5181 Encounter for therapeutic drug level monitoring: Secondary | ICD-10-CM | POA: Diagnosis not present

## 2018-09-22 DIAGNOSIS — R05 Cough: Secondary | ICD-10-CM

## 2018-09-22 NOTE — Patient Instructions (Signed)
Medication Instructions:  DECREASE YOUR LOSARTAN TO 25 MG DAILY  If you need a refill on your cardiac medications before your next appointment, please call your pharmacy.   Lab work: FASTING LP/HFP/CBC SOON  If you have labs (blood work) drawn today and your tests are completely normal, you will receive your results only by: Marland Kitchen MyChart Message (if you have MyChart) OR . A paper copy in the mail If you have any lab test that is abnormal or we need to change your treatment, we will call you to review the results.  Testing/Procedures: Your physician has requested that you have a lower or upper extremity venous duplex. This test is an ultrasound of the veins in the legs or arms. It looks at venous blood flow that carries blood from the heart to the legs or arms. Allow one hour for a Lower Venous exam. Allow thirty minutes for an Upper Venous exam. There are no restrictions or special instructions. LEFT LEG  A chest x-ray takes a picture of the organs and structures inside the chest, including the heart, lungs, and blood vessels. This test can show several things, including, whether the heart is enlarges; whether fluid is building up in the lungs; and whether pacemaker / defibrillator leads are still in place. [facility]   Follow-Up: At [facility], you and your health needs are our priority.  As part of our continuing mission to provide you with exceptional heart care, we have created designated Provider Care Teams.  These Care Teams include your primary Cardiologist (physician) and Advanced Practice Providers (APPs -  Physician Assistants and Nurse Practitioners) who all work together to provide you with the care you need, when you need it. You will need a follow up appointment in 3 months.  Please call our office 2 months in advance to schedule this appointment.  You may see DR [facility] or one of the following Advanced Practice Providers on your designated Care Team:   Kerin Ransom,  PA-C Roby [place]fts, [place] . Sande Rives, PA-C

## 2018-09-22 NOTE — Progress Notes (Signed)
Cardiology Office Note   Date:  09/22/2018   ID:  [name] [name], DOB 01/29/1946, MRN [id number]  PCP:  Patient, No Pcp Per  Cardiologist:   Skeet Latch, MD   No chief complaint on file.    History of Present Illness: [name] [name] is a 72 y.o. female with hypertension, PAD, hyperlipidemia, breast cancer, OSA, chronic diastolic heart failure, breast cancer, and Cerebral palsy, who presents for follow up.  She was initially seen in 2016 for an evaluation of shortness of breath.  At that appointment she denied feeling short of breath but did have some lower extremity edema.  Her symptoms were thought to be more to be due to venous stasis than heart failure.  She was sent for labs that revealed a BNP of 24.  She was diagnosed with Stage III breast cancer and completed XRT 09/2017.  She did not receive chemotherapy 2/2 poor functional status and comorbidities.    She had a sleep study 10/2017 that revealed very mild sleep apnea.  A chin strap or CPAP was recommended.  She reports that she has been doing well.  She has no chest pain or edema.  She denies orthopnea or PND.  She gets short of breath when she tries to walk, but no longer walks much 2/2 leg pain and weakness.  She notes that she often worries about her brother who had 4 strokes.   Valsartan was switched to losartan due to the national recall. She continues to live in a SNF.  She is not ambulatory.  She has been feeling well but notes that she spends most of her day in bed.  She hasn't noted any chest pain, shortness of breath or edema.  She denies palpitations.  For the last couple days she has been wheezing and has a cough.  She denies fever or chills.  She notes that food sometimes goes down wrong when she eats.    Past Medical History:  Diagnosis Date  . Anemia   . Anxiety   . Bronchitis   . Cerebral palsy ([place])   . Chronic diastolic CHF (congestive heart failure) ([place]) 05/27/2014  . CKD (chronic kidney disease)   . Cough  05/27/2014   ?  ACE cough August, 2015?when d/c'd   . Depression   . DM type 2 with diabetic peripheral neuropathy ([place])   . Dysphagia   . Ejection fraction   . GERD (gastroesophageal reflux disease)   . Glaucoma   . Gout   . History of radiation therapy 08/18/17- 10/01/17   Left Chest wall and regional lymph nodes treated to 50 Gy in 25 fractions followed by a 10 Gy boost in 5 fractions to the left chest wall scar.   . Hyperaldosteronism ([place])   . Hypercholesterolemia   . Hypertension   . Hypertensive heart disease   . Morbid obesity ([place])   . Neuropathy   . Osteoarthritis   . PAD (peripheral artery disease) ([place])     Past Surgical History:  Procedure Laterality Date  . BLADDER SURGERY    . BREAST BIOPSY    . CLOSED REDUCTION PROXIMAL FIBULAR FRACTURE Left   . LEG SURGERY Left    upper leg  . MASTECTOMY Left    2018  . MASTECTOMY WITH AXILLARY LYMPH NODE DISSECTION Left 06/25/2017   Procedure: LEFT MODIFIED RADICAL MASTECTOMY WITH AXILLARY LYMPH NODE DISSECTION ERAS PATHWAY;  Surgeon: Donnie Mesa, MD;  Location: [facility];  Service: General;  Laterality: Left;  Current Outpatient Medications  Medication Sig Dispense Refill  . acetaminophen (TYLENOL) 325 MG tablet Take 325 mg by mouth every 6 (six) hours as needed for moderate pain. Do not exceed 4 gms in 24 hours    . ADVAIR DISKUS 250-50 MCG/DOSE AEPB Inhale 1 puff into the lungs 2 (two) times daily.  0  . allopurinol (ZYLOPRIM) 300 MG tablet Take 300 mg by mouth daily.    . Calcium Carbonate (CALTRATE 600 PO) Take 600 mg by mouth 2 (two) times daily.    . Cholecalciferol 50000 units TABS Take 50,000 Units by mouth every 30 (thirty) days. On the 17th for osteoporosis    . cyclobenzaprine (FLEXERIL) 10 MG tablet Take 10 mg by mouth 3 (three) times daily.     Marland Kitchen docusate sodium (COLACE) 100 MG capsule Take 100 mg by mouth 2 (two) times daily.    Marland Kitchen escitalopram (LEXAPRO) 10 MG tablet Take 10 mg by mouth daily.    . famotidine  (PEPCID) 20 MG tablet Take 20 mg by mouth 2 (two) times daily.    . ferrous sulfate 325 (65 FE) MG tablet Take 325 mg by mouth daily with breakfast.    . furosemide (LASIX) 80 MG tablet Take 80 mg by mouth daily.    Marland Kitchen gabapentin (NEURONTIN) 300 MG capsule Take 300 mg by mouth 3 (three) times daily.    Marland Kitchen guaiFENesin (MUCINEX) 600 MG 12 hr tablet Take 600 mg by mouth 2 (two) times daily.    Marland Kitchen ipratropium-albuterol (DUONEB) 0.5-2.5 (3) MG/3ML SOLN Take 3 mLs by nebulization every 6 (six) hours as needed (cough, congestion, and wheezing).    Marland Kitchen letrozole (FEMARA) 2.5 MG tablet Take 1 tablet (2.5 mg total) by mouth daily. 90 tablet 3  . loratadine (CLARITIN) 10 MG tablet Take 10 mg by mouth daily.    Marland Kitchen losartan (COZAAR) 25 MG tablet Take 25 mg by mouth daily.    . metFORMIN (GLUCOPHAGE) 500 MG tablet Take 500 mg by mouth 2 (two) times daily.     . metoprolol tartrate (LOPRESSOR) 25 MG tablet Take 25 mg by mouth 2 (two) times daily.    . Multiple Vitamins-Minerals (CERTAGEN PO) Take 1 tablet by mouth daily.    Marland Kitchen olopatadine (PATANOL) 0.1 % ophthalmic solution Place 1 drop into both eyes daily.     . ondansetron (ZOFRAN) 4 MG tablet Take 4 mg by mouth every 6 (six) hours as needed for nausea or vomiting.    Marland Kitchen oxyCODONE-acetaminophen (PERCOCET) 10-325 MG tablet Take one tablet by mouth three times daily for pain. Do not exceed 4gm of Tylenol in 24 hours 90 tablet 0  . pantoprazole (PROTONIX) 40 MG tablet Take 40 mg by mouth daily.    . polyethylene glycol (MIRALAX / GLYCOLAX) packet Take 17 g by mouth daily.    . potassium chloride SA (K-DUR,KLOR-CON) 20 MEQ tablet Take 20 mEq by mouth 2 (two) times daily.    Marland Kitchen Propylene Glycol (SYSTANE BALANCE OP) Apply 1 drop to eye 2 (two) times daily.    . sennosides-docusate sodium (SENOKOT-S) 8.6-50 MG tablet Take 1 tablet by mouth at bedtime.    . simvastatin (ZOCOR) 10 MG tablet Take 10 mg by mouth at bedtime.    . Tiotropium Bromide Monohydrate (SPIRIVA  RESPIMAT) 2.5 MCG/ACT AERS Inhale 2 puffs into the lungs daily.    . vitamin C (ASCORBIC ACID) 500 MG tablet Take 500 mg by mouth 2 (two) times daily.     No current  facility-administered medications for this visit.     Allergies:   Miconazole    Social History:  The patient  reports that she quit smoking about 14 years ago. Her smoking use included cigarettes. She has a 80.00 pack-year smoking history. She has never used smokeless tobacco. She reports that she does not drink alcohol or use drugs.   Family History:  The patient's family history includes Heart disease in her father and mother.    ROS:  Please see the history of present illness.   Otherwise, review of systems are positive for hard of hearing.  cough.   All other systems are reviewed and negative.    PHYSICAL EXAM: VS:  BP 98/72   Pulse 74   Ht 5' (1.524 m)   BMI 40.62 kg/m  , BMI Body mass index is 40.62 kg/m. GENERAL:  Chronically ill-appearing HEENT: Pupils equal round and reactive, fundi not visualized, oral mucosa unremarkable NECK:  No jugular venous distention, waveform within normal limits, carotid upstroke brisk and symmetric, no bruits, no thyromegaly LYMPHATICS:  No cervical adenopathy LUNGS:  Diffuse wheezing and rhonchi. HEART:  RRR.  PMI not displaced or sustained,S1 and S2 within normal limits, no S3, no S4, no clicks, no rubs, no murmurs ABD:  Flat, positive bowel sounds normal in frequency in pitch, no bruits, no rebound, no guarding, no midline pulsatile mass, no hepatomegaly, no splenomegaly EXT:  2 plus pulses throughout, L LE edema, no cyanosis no clubbing SKIN:  No rashes no nodules NEURO:  Cranial nerves II through XII grossly intact, motor grossly intact throughout PSYCH:  Cognitively intact, oriented to person place and time   EKG:  EKG is ordered today. The ekg ordered 08/16/15 demonstrates sinus rhythm at 78 bpm.  Low voltage. 12/17/17: Sinus rhythm.  Rate 72 bpm.  Low voltage.    09/22/2018: Sinus rhythm.  Rate 74 bpm.  Low voltage.  Echo 08/2017: Study Conclusions  - Left ventricle: There was mild focal basal hypertrophy of the   septum. Systolic function was normal. The estimated ejection   fraction was in the range of 55% to 60%. - Mitral valve: Calcified annulus. - Pulmonary arteries: PA peak pressure: 33 mm Hg (S).  Impressions:  - Extremely limited due to poor sound wave transmission; no apical   or subcostal views; normal LV systolic function; focal wall   motion abnormality cannot be excluded; no pericardial effusion;   doppler suboptimal.  Recent Labs: No results found for requested labs within last 8760 hours.   08/18/15: Sodium 139, potassium 4.3, BUN 21, creatinine 0.68 Total cholesterol 111, try glycerides 174, HDL 26, LDL 50 BNP 24.9  08/28/18:  Sodium 142, potassium 4.3, BUN 21, creatinine 0.79 Lipid Panel No results found for: CHOL, TRIG, HDL, CHOLHDL, VLDL, LDLCALC, LDLDIRECT    Wt Readings from Last 3 Encounters:  08/11/18 208 lb (94.3 kg)  02/20/18 211 lb (95.7 kg)  11/19/17 207 lb (93.9 kg)      ASSESSMENT AND PLAN:  # Chronic diastolic heart failure: Stable.  She is euvolemic.  Continue lasix.  Her blood pressure is low.  Continue metoprolol and reduce losartan to 25 mg daily.  # Hypertension: Blood pressure is too low.  She denies lightheadedness or dizziness.  We will reduce losartan as above and continue metoprolol.  # Hyperlipidemia: Check fasting lipids today.  #Asymmetric edema: Ms. Lavoy has left lower extremity edema which is new.  She is nonambulatory and this is concerning for DVT.  She was  unable to get on the exam table to have lower extremity Dopplers today.  We will refer her to have this completed at the hospital.  #Cough: #Wheezing: Ms. Odonnell has rhonchi on exam and is wheezing.  I am concerned that she may be aspirating.  She does not seem febrile or have an infection.  We will get a chest x-ray to better  assess.    Current medicines are reviewed at length with the patient today.  The patient does not have concerns regarding medicines.  The following changes have been made: reduce losartan   Labs/ tests ordered today include:   Orders Placed This Encounter  Procedures  . DG Chest 2 View  . Lipid panel  . CBC with Differential/Platelet  . Hepatic function panel  . EKG 12-Lead     Disposition:   FU with Cimone Fahey C. Oval Linsey, MD in 3 months     Signed, Skeet Latch, MD  09/22/2018 1:16 PM    [place]

## 2018-09-23 LAB — CBC WITH DIFFERENTIAL/PLATELET
BASOS ABS: 0 10*3/uL (ref 0.0–0.2)
Basos: 1 %
EOS (ABSOLUTE): 0.5 10*3/uL — ABNORMAL HIGH (ref 0.0–0.4)
Eos: 6 %
HEMATOCRIT: 40 % (ref 34.0–46.6)
Hemoglobin: 13.4 g/dL (ref 11.1–15.9)
IMMATURE GRANULOCYTES: 1 %
Immature Grans (Abs): 0 10*3/uL (ref 0.0–0.1)
LYMPHS: 24 %
Lymphocytes Absolute: 2 10*3/uL (ref 0.7–3.1)
MCH: 32 pg (ref 26.6–33.0)
MCHC: 33.5 g/dL (ref 31.5–35.7)
MCV: 96 fL (ref 79–97)
MONOS ABS: 0.8 10*3/uL (ref 0.1–0.9)
Monocytes: 10 %
NEUTROS ABS: 5 10*3/uL (ref 1.4–7.0)
NEUTROS PCT: 58 %
PLATELETS: 332 10*3/uL (ref 150–450)
RBC: 4.19 x10E6/uL (ref 3.77–5.28)
RDW: 13.5 % (ref 12.3–15.4)
WBC: 8.4 10*3/uL (ref 3.4–10.8)

## 2018-09-23 LAB — HEPATIC FUNCTION PANEL
ALBUMIN: 4.1 g/dL (ref 3.5–4.8)
ALT: 26 IU/L (ref 0–32)
AST: 28 IU/L (ref 0–40)
Alkaline Phosphatase: 108 IU/L (ref 39–117)
BILIRUBIN TOTAL: 0.3 mg/dL (ref 0.0–1.2)
Bilirubin, Direct: 0.11 mg/dL (ref 0.00–0.40)
TOTAL PROTEIN: 6.6 g/dL (ref 6.0–8.5)

## 2018-09-23 LAB — LIPID PANEL
CHOL/HDL RATIO: 4.1 ratio (ref 0.0–4.4)
Cholesterol, Total: 131 mg/dL (ref 100–199)
HDL: 32 mg/dL — ABNORMAL LOW (ref >39)
LDL CALC: 60 mg/dL (ref 0–99)
Triglycerides: 197 mg/dL — ABNORMAL HIGH (ref 0–149)
VLDL CHOLESTEROL CAL: 39 mg/dL (ref 5–40)

## 2018-10-01 ENCOUNTER — Other Ambulatory Visit: Payer: Self-pay

## 2018-10-01 DIAGNOSIS — R599 Enlarged lymph nodes, unspecified: Secondary | ICD-10-CM

## 2018-10-01 DIAGNOSIS — C50512 Malignant neoplasm of lower-outer quadrant of left female breast: Secondary | ICD-10-CM

## 2018-10-01 DIAGNOSIS — Z170 Estrogen receptor positive status [ER+]: Principal | ICD-10-CM

## 2018-10-01 NOTE — Progress Notes (Addendum)
Per [facility] Rad[facility] report, follow up CT of abd/pelvis recommended.  Ok per [facility] RN / Dr Lindi Adie.  Calling scheduling to make appt for patient and [facility] requests call - back tomorrow with appt details so they can get transportation arranged for pt.  Can ask to speak to nurse Danae Chen at their main number of [phone].    Will send in basket to [facility] for pre-auth and will let Dr Geralyn Flash nurse be aware to call [facility] tomorrow to inform nurse of appt date/time, which is 12/23 lab appt at 12:30 pm and then CT at 1:30 pm.  Someone from [facility] needs to pick up oral contrast prior, (1st bottle at 11:30 am then 2nd bottle at 12:30 and nothing to eat or drink 4 hours prior which is 9:30 am.

## 2018-10-05 ENCOUNTER — Telehealth: Payer: Self-pay

## 2018-10-05 NOTE — Telephone Encounter (Signed)
[facility] to remind them that pt is scheduled for CT scan, requested for 10/12/2018. Have not been able to speak with charge nurse. Phone keeps ringing with no answer. Attempted to contact x2. Faxed calendar with Ct appt and oral contrast instructions.

## 2018-10-09 ENCOUNTER — Other Ambulatory Visit: Payer: Self-pay

## 2018-10-09 DIAGNOSIS — Z170 Estrogen receptor positive status [ER+]: Principal | ICD-10-CM

## 2018-10-09 DIAGNOSIS — C50512 Malignant neoplasm of lower-outer quadrant of left female breast: Secondary | ICD-10-CM

## 2018-10-12 ENCOUNTER — Telehealth: Payer: Self-pay

## 2018-10-12 ENCOUNTER — Ambulatory Visit (HOSPITAL_COMMUNITY)
Admission: RE | Admit: 2018-10-12 | Discharge: 2018-10-12 | Disposition: A | Discharge status: Home / Self Care | Payer: Medicare Other | Source: Ambulatory Visit | Attending: Hematology and Oncology | Admitting: Hematology and Oncology

## 2018-10-12 ENCOUNTER — Inpatient Hospital Stay: Payer: Medicare Other | Attending: Hematology and Oncology

## 2018-10-12 DIAGNOSIS — Z79811 Long term (current) use of aromatase inhibitors: Secondary | ICD-10-CM | POA: Diagnosis not present

## 2018-10-12 DIAGNOSIS — Z170 Estrogen receptor positive status [ER+]: Secondary | ICD-10-CM | POA: Insufficient documentation

## 2018-10-12 DIAGNOSIS — C50512 Malignant neoplasm of lower-outer quadrant of left female breast: Secondary | ICD-10-CM | POA: Insufficient documentation

## 2018-10-12 DIAGNOSIS — Z79899 Other long term (current) drug therapy: Secondary | ICD-10-CM | POA: Diagnosis not present

## 2018-10-12 DIAGNOSIS — R599 Enlarged lymph nodes, unspecified: Secondary | ICD-10-CM | POA: Insufficient documentation

## 2018-10-12 DIAGNOSIS — C7951 Secondary malignant neoplasm of bone: Secondary | ICD-10-CM | POA: Insufficient documentation

## 2018-10-12 DIAGNOSIS — Z923 Personal history of irradiation: Secondary | ICD-10-CM | POA: Diagnosis not present

## 2018-10-12 LAB — CBC WITH DIFFERENTIAL (CANCER CENTER ONLY)
ABS IMMATURE GRANULOCYTES: 0.03 10*3/uL (ref 0.00–0.07)
BASOS ABS: 0 10*3/uL (ref 0.0–0.1)
BASOS PCT: 1 %
EOS ABS: 0.4 10*3/uL (ref 0.0–0.5)
Eosinophils Relative: 5 %
HCT: 42.9 % (ref 36.0–46.0)
Hemoglobin: 13.8 g/dL (ref 12.0–15.0)
Immature Granulocytes: 0 %
Lymphocytes Relative: 15 %
Lymphs Abs: 1.3 10*3/uL (ref 0.7–4.0)
MCH: 32.5 pg (ref 26.0–34.0)
MCHC: 32.2 g/dL (ref 30.0–36.0)
MCV: 101.2 fL — AB (ref 80.0–100.0)
MONOS PCT: 10 %
Monocytes Absolute: 0.8 10*3/uL (ref 0.1–1.0)
NEUTROS ABS: 6 10*3/uL (ref 1.7–7.7)
NEUTROS PCT: 69 %
NRBC: 0 % (ref 0.0–0.2)
PLATELETS: 279 10*3/uL (ref 150–400)
RBC: 4.24 MIL/uL (ref 3.87–5.11)
RDW: 14.5 % (ref 11.5–15.5)
WBC Count: 8.6 10*3/uL (ref 4.0–10.5)

## 2018-10-12 LAB — CMP (CANCER CENTER ONLY)
ALT: 21 U/L (ref 0–44)
ANION GAP: 14 (ref 5–15)
AST: 17 U/L (ref 15–41)
Albumin: 3.6 g/dL (ref 3.5–5.0)
Alkaline Phosphatase: 108 U/L (ref 38–126)
BILIRUBIN TOTAL: 0.3 mg/dL (ref 0.3–1.2)
BUN: 16 mg/dL (ref 8–23)
CALCIUM: 10.7 mg/dL — AB (ref 8.9–10.3)
CO2: 26 mmol/L (ref 22–32)
Chloride: 103 mmol/L (ref 98–111)
Creatinine: 0.81 mg/dL (ref 0.44–1.00)
GFR, Estimated: >60 mL/min (ref >60)
Glucose, Bld: 173 mg/dL — ABNORMAL HIGH (ref 70–99)
POTASSIUM: 4.1 mmol/L (ref 3.5–5.1)
SODIUM: 143 mmol/L (ref 135–145)
TOTAL PROTEIN: 7.3 g/dL (ref 6.5–8.1)

## 2018-10-12 MED ORDER — IOHEXOL 300 MG/ML  SOLN
100.0000 mL | Freq: Once | INTRAMUSCULAR | Status: AC | PRN
  Administered 2018-10-12: 100 mL via INTRAVENOUS

## 2018-10-12 MED ORDER — SODIUM CHLORIDE (PF) 0.9 % IJ SOLN
INTRAMUSCULAR | Status: AC
  Filled 2018-10-12: qty 50

## 2018-10-12 NOTE — Telephone Encounter (Signed)
Received call from [facility] imaging regarding pt CT abd results. They were abnormal and will need to be reviewed by Dr.Magrinat since Dr.Gudena is out of office today. Called and spoke with nursing home RN and set up follow up appt for this Friday with Dr.Gudena to discuss CT scan results. Confirmed time/date.

## 2018-10-13 ENCOUNTER — Other Ambulatory Visit: Payer: Self-pay | Admitting: Oncology

## 2018-10-16 ENCOUNTER — Inpatient Hospital Stay (HOSPITAL_BASED_OUTPATIENT_CLINIC_OR_DEPARTMENT_OTHER): Payer: Medicare Other | Admitting: Hematology and Oncology

## 2018-10-16 ENCOUNTER — Non-Acute Institutional Stay: Payer: Self-pay | Admitting: Internal Medicine

## 2018-10-16 DIAGNOSIS — Z170 Estrogen receptor positive status [ER+]: Secondary | ICD-10-CM

## 2018-10-16 DIAGNOSIS — Z79811 Long term (current) use of aromatase inhibitors: Secondary | ICD-10-CM | POA: Diagnosis not present

## 2018-10-16 DIAGNOSIS — C50512 Malignant neoplasm of lower-outer quadrant of left female breast: Secondary | ICD-10-CM

## 2018-10-16 DIAGNOSIS — Z923 Personal history of irradiation: Secondary | ICD-10-CM

## 2018-10-16 DIAGNOSIS — C7951 Secondary malignant neoplasm of bone: Secondary | ICD-10-CM | POA: Diagnosis not present

## 2018-10-16 DIAGNOSIS — Z79899 Other long term (current) drug therapy: Secondary | ICD-10-CM

## 2018-10-16 NOTE — Progress Notes (Signed)
Patient Care Team: Patient, No Pcp Per as PCP - General (General Practice) Nicholas Lose, MD as Consulting Physician (Hematology and Oncology) Donnie Mesa, MD as Consulting Physician (General Surgery) Eppie Gibson, MD as Attending Physician (Radiation Oncology) Gardenia Phlegm, NP as Nurse Practitioner (Hematology and Oncology)  DIAGNOSIS:  Encounter Diagnosis  Name Primary?  . 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%, PR 95%, Ki-67 25%, HER-2 negative ratio 0.97, grade 2; lymph node biopsy positive for metastatic cancer, ER 95%, PR 95%, Ki-67 40%, HER-2 negative ratio 1.2; left breast mass 7 irregular 9 cm with nipple retraction inflammatory breast cancer, at least 3 enlarged abnormal lymph nodes, T4d N1 stage IIIa (AJCC 8)    06/25/2017 Surgery    Left breast modified radical mastectomy: Invasive lobular carcinoma, 10.5 cm, grade 2, tumor involves skin and nipple, 12/13 lymph nodes positive, involves skeletal muscle and adipose tissue, ER 95%, PR 95%, HER-2 negative, Ki-67 25% to 40% T4b N3a stage IIIB AJCC 8    08/19/2017 - 10/01/2017 Radiation Therapy    Adj XRT    10/2017 -  Anti-estrogen oral therapy    Letrozole daily    10/12/2018 Imaging    CT abdomen and pelvis done to evaluate inguinal lymph node: 3.4 cm left inguinal lymph node.  Additional inguinal and external iliac lymphadenopathy.  Innumerable sclerotic bone lesions consistent with metastatic disease, 7 cm fluid collection left mastectomy bed.     CHIEF COMPLIANT: Follow-up to discuss recent CT findings  INTERVAL HISTORY: [name] [name] is a 72 year old with above-mentioned history of left breast cancer  treated with mastectomy radiation and has been on letrozole therapy since January 2019.  She presented with inguinal lymph node that was enlarged.  She underwent CT of the abdomen and pelvis and is here today to discuss the results.  There was evidence of bone metastases in the lumbar sacral spine as well as the pelvis and an enlarged inguinal lymph node.  She is here in our office to discuss the next steps in the treatment plan.  It is very difficult to communicate with her because she is hard of hearing as well as difficulty with vision and are also slightly mentally challenged as well.  REVIEW OF SYSTEMS:   Constitutional: Denies fevers, chills or abnormal weight loss Eyes: Denies blurriness of vision Ears, nose, mouth, throat, and face: Hearing impairment Respiratory: Denies cough, dyspnea or wheezes Cardiovascular: Denies palpitation, chest discomfort Gastrointestinal:  Denies nausea, heartburn or change in bowel habits Skin: Denies abnormal skin rashes Lymphatics: Left inguinal lymph node Neurological: Generalized weakness Behavioral/Psych: Mood is stable, no new changes  Extremities: No lower extremity edema  All other systems were reviewed with the patient and are negative.  I have reviewed the past medical history, past surgical history, social history and family history with the patient and they are unchanged from previous note.  ALLERGIES:  is allergic to miconazole.  MEDICATIONS:  Current Outpatient Medications  Medication Sig Dispense Refill  . acetaminophen (TYLENOL) 325 MG tablet Take 325 mg by mouth every 6 (six) hours as needed for moderate pain. Do not exceed 4 gms in 24 hours    . ADVAIR DISKUS 250-50 MCG/DOSE AEPB Inhale 1  puff into the lungs 2 (two) times daily.  0  . allopurinol (ZYLOPRIM) 300 MG tablet Take 300 mg by mouth daily.    . Calcium Carbonate (CALTRATE 600 PO) Take 600 mg by mouth 2 (two) times daily.    . Cholecalciferol 50000 units TABS Take 50,000 Units  by mouth every 30 (thirty) days. On the 17th for osteoporosis    . cyclobenzaprine (FLEXERIL) 10 MG tablet Take 10 mg by mouth 3 (three) times daily.     Marland Kitchen docusate sodium (COLACE) 100 MG capsule Take 100 mg by mouth 2 (two) times daily.    Marland Kitchen escitalopram (LEXAPRO) 10 MG tablet Take 10 mg by mouth daily.    . famotidine (PEPCID) 20 MG tablet Take 20 mg by mouth 2 (two) times daily.    . ferrous sulfate 325 (65 FE) MG tablet Take 325 mg by mouth daily with breakfast.    . furosemide (LASIX) 80 MG tablet Take 80 mg by mouth daily.    Marland Kitchen gabapentin (NEURONTIN) 300 MG capsule Take 300 mg by mouth 3 (three) times daily.    Marland Kitchen guaiFENesin (MUCINEX) 600 MG 12 hr tablet Take 600 mg by mouth 2 (two) times daily.    Marland Kitchen ipratropium-albuterol (DUONEB) 0.5-2.5 (3) MG/3ML SOLN Take 3 mLs by nebulization every 6 (six) hours as needed (cough, congestion, and wheezing).    Marland Kitchen letrozole (FEMARA) 2.5 MG tablet Take 1 tablet (2.5 mg total) by mouth daily. 90 tablet 3  . loratadine (CLARITIN) 10 MG tablet Take 10 mg by mouth daily.    Marland Kitchen losartan (COZAAR) 25 MG tablet Take 25 mg by mouth daily.    . metFORMIN (GLUCOPHAGE) 500 MG tablet Take 500 mg by mouth 2 (two) times daily.     . metoprolol tartrate (LOPRESSOR) 25 MG tablet Take 25 mg by mouth 2 (two) times daily.    . Multiple Vitamins-Minerals (CERTAGEN PO) Take 1 tablet by mouth daily.    Marland Kitchen olopatadine (PATANOL) 0.1 % ophthalmic solution Place 1 drop into both eyes daily.     . ondansetron (ZOFRAN) 4 MG tablet Take 4 mg by mouth every 6 (six) hours as needed for nausea or vomiting.    Marland Kitchen oxyCODONE-acetaminophen (PERCOCET) 10-325 MG tablet Take one tablet by mouth three times daily for pain. Do not exceed 4gm of Tylenol in 24 hours 90 tablet 0  . pantoprazole (PROTONIX) 40 MG tablet Take 40 mg by mouth daily.    . polyethylene glycol (MIRALAX / GLYCOLAX) packet Take 17 g by mouth daily.    . potassium chloride SA (K-DUR,KLOR-CON) 20 MEQ tablet Take 20 mEq by mouth 2  (two) times daily.    Marland Kitchen Propylene Glycol (SYSTANE BALANCE OP) Apply 1 drop to eye 2 (two) times daily.    . sennosides-docusate sodium (SENOKOT-S) 8.6-50 MG tablet Take 1 tablet by mouth at bedtime.    . simvastatin (ZOCOR) 10 MG tablet Take 10 mg by mouth at bedtime.    . Tiotropium Bromide Monohydrate (SPIRIVA RESPIMAT) 2.5 MCG/ACT AERS Inhale 2 puffs into the lungs daily.    . vitamin C (ASCORBIC ACID) 500 MG tablet Take 500 mg by mouth 2 (two) times daily.     No current facility-administered medications for this visit.     PHYSICAL EXAMINATION: ECOG PERFORMANCE STATUS: 1 - Symptomatic but completely ambulatory  Vitals:   10/16/18 1217  BP: (!) 126/96  Pulse: 91  Resp: 16  Temp: 98.2 F (36.8 C)  SpO2: 92%   Filed  Weights    GENERAL:alert, no distress and comfortable SKIN: skin color, texture, turgor are normal, no rashes or significant lesions EYES: normal, Conjunctiva are pink and non-injected, sclera clear OROPHARYNX:no exudate, no erythema and lips, buccal mucosa, and tongue normal  NECK: supple, thyroid normal size, non-tender, without nodularity LYMPH:  no palpable lymphadenopathy in the cervical, axillary or inguinal LUNGS: clear to auscultation and percussion with normal breathing effort HEART: regular rate & rhythm and no murmurs and no lower extremity edema ABDOMEN:abdomen soft, non-tender and normal bowel sounds MUSCULOSKELETAL:no cyanosis of digits and no clubbing  NEURO: alert & oriented x 3 with fluent speech, no focal motor/sensory deficits EXTREMITIES: No lower extremity edema   LABORATORY DATA:  I have reviewed the data as listed CMP Latest Ref Rng & Units 10/12/2018 09/22/2018 06/26/2017  Glucose 70 - 99 mg/dL 173(H) - 131(H)  BUN 8 - 23 mg/dL 16 - 14  Creatinine 0.44 - 1.00 mg/dL 0.81 - 0.74  Sodium 135 - 145 mmol/L 143 - 139  Potassium 3.5 - 5.1 mmol/L 4.1 - 4.2  Chloride 98 - 111 mmol/L 103 - 104  CO2 22 - 32 mmol/L 26 - 27  Calcium 8.9 - 10.3  mg/dL 10.7(H) - 8.5(L)  Total Protein 6.5 - 8.1 g/dL 7.3 6.6 -  Total Bilirubin 0.3 - 1.2 mg/dL 0.3 0.3 -  Alkaline Phos 38 - 126 U/L 108 108 -  AST 15 - 41 U/L 17 28 -  ALT 0 - 44 U/L 21 26 -    Lab Results  Component Value Date   WBC 8.6 10/12/2018   HGB 13.8 10/12/2018   HCT 42.9 10/12/2018   MCV 101.2 (H) 10/12/2018   PLT 279 10/12/2018   NEUTROABS 6.0 10/12/2018    ASSESSMENT & PLAN:  Malignant neoplasm of lower-outer quadrant of left breast of female, estrogen receptor positive ([place]) 06/25/2017:Left breast modified radical mastectomy: Invasive lobular carcinoma, 10.5 cm, grade 2, tumor involves skin and nipple, 12/13 lymph nodes positive, involves skeletal muscle and adipose tissue, ER 95%, PR 95%, HER-2 negative, Ki-67 25% to 40% T4b N3a stage IIIB AJCC 8  Treatment summary: 1.Ideally patient needs chemotherapy but she is in no shape to receive chemotherapy. Her performance status is poor and she has many comorbidities that makes chemotherapy not an option. 2.adjuvant radiation therapy10/30/2018-10/01/2017 3.Adjuvant antiestrogen therapywith letrozole 2.5 mg daily started 11/04/2017 ---------------------------------------------------------------------------------------------------------------------------------------------   10/12/2018: CT abdomen pelvis: done to evaluate inguinal lymph node: 3.4 cm left inguinal lymph node.  Additional inguinal and external iliac lymphadenopathy.  Innumerable sclerotic bone lesions consistent with metastatic disease, 7 cm fluid collection left mastectomy bed.  Radiology review: I reviewed the CT scan report and described the findings including the lymphadenopathy and bone metastases.  Recommendation: 1.  Ultrasound-guided biopsy of inguinal lymph node to repeat breast prognostic panel and to confirm metastatic disease. 2.  If she is ER positive then we will start Ibrance with Faslodex. 3.  [facility] molecular testing will also be  requested including BRCA panel.  Return to clinic after biopsy to discuss starting treatment. Patient has hearing impairment so I wrote all the instructions to her on paper.  She was able to repeat most of my conversation but it was after multiple attempts to help her understand what is going on.     Orders Placed This Encounter  Procedures  . [place] CORE BIOPSY (LYMPH NODES)    Standing Status:   Future    Standing Expiration Date:   12/18/2019  Order Specific Question:   Lab orders requested (DO NOT place separate lab orders, these will be automatically ordered during procedure specimen collection):    Answer:   Surgical Pathology    Order Specific Question:   Reason for Exam (SYMPTOM  OR DIAGNOSIS REQUIRED)    Answer:   Left inguinal Lymph node biopsy for metastatic breast cancer evaluation    Comments:   recent CT    Order Specific Question:   Preferred imaging location?    Answer:   [facility]   The patient has a good understanding of the overall plan. she agrees with it. she will call with any problems that may develop before the next visit here.   Harriette Ohara, MD 10/16/18

## 2018-10-16 NOTE — Assessment & Plan Note (Signed)
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 Her performance status is poor and she has many comorbidities that makes chemotherapy not an option. 2.adjuvant radiation therapy10/30/2018-10/01/2017 3.Adjuvant antiestrogen therapywith letrozole 2.5 mg daily started 11/04/2017 ---------------------------------------------------------------------------------------------------------------------------------------------   10/12/2018: CT abdomen pelvis: done to evaluate inguinal lymph node: 3.4 cm left inguinal lymph node.  Additional inguinal and external iliac lymphadenopathy.  Innumerable sclerotic bone lesions consistent with metastatic disease, 7 cm fluid collection left mastectomy bed.  Radiology review: I reviewed the CT scan report and described the findings including the lymphadenopathy and bone metastases.  Recommendation: 1.  Ultrasound-guided biopsy of inguinal lymph node to repeat breast prognostic panel and to confirm metastatic disease. 2.  If she is ER positive then we will start Ibrance with Faslodex. 3.  [place] molecular testing will also be requested including BRCA panel.  Return to clinic 2 weeks after starting Ibrance.

## 2018-10-19 ENCOUNTER — Telehealth: Payer: Self-pay

## 2018-10-19 ENCOUNTER — Telehealth: Payer: Self-pay | Admitting: Hematology and Oncology

## 2018-10-19 NOTE — Telephone Encounter (Signed)
This nurse reached out to [facility] at [phone] regarding upcoming biopsy and follow up with provider.  Was transferred, no answer, no voicemail.  Faxed appointment time and dates to Attn: Kim at [phone].  Confirmation received.  Nurse requested [facility] to contact cancer center once received.

## 2018-10-19 NOTE — Telephone Encounter (Signed)
Per 12/27 no los 

## 2018-10-20 ENCOUNTER — Other Ambulatory Visit: Payer: Self-pay | Admitting: Radiology

## 2018-10-23 ENCOUNTER — Ambulatory Visit (HOSPITAL_COMMUNITY)
Admission: RE | Admit: 2018-10-23 | Discharge: 2018-10-23 | Disposition: A | Discharge status: Home / Self Care | Payer: Medicare Other | Source: Ambulatory Visit | Attending: Hematology and Oncology | Admitting: Hematology and Oncology

## 2018-10-23 ENCOUNTER — Encounter (HOSPITAL_COMMUNITY): Payer: Self-pay

## 2018-10-23 ENCOUNTER — Other Ambulatory Visit: Payer: Self-pay

## 2018-10-23 DIAGNOSIS — Z7984 Long term (current) use of oral hypoglycemic drugs: Secondary | ICD-10-CM | POA: Diagnosis not present

## 2018-10-23 DIAGNOSIS — Z9012 Acquired absence of left breast and nipple: Secondary | ICD-10-CM | POA: Diagnosis not present

## 2018-10-23 DIAGNOSIS — I739 Peripheral vascular disease, unspecified: Secondary | ICD-10-CM | POA: Insufficient documentation

## 2018-10-23 DIAGNOSIS — Z79899 Other long term (current) drug therapy: Secondary | ICD-10-CM | POA: Insufficient documentation

## 2018-10-23 DIAGNOSIS — Z87891 Personal history of nicotine dependence: Secondary | ICD-10-CM | POA: Diagnosis not present

## 2018-10-23 DIAGNOSIS — C774 Secondary and unspecified malignant neoplasm of inguinal and lower limb lymph nodes: Secondary | ICD-10-CM | POA: Insufficient documentation

## 2018-10-23 DIAGNOSIS — I130 Hypertensive heart and chronic kidney disease with heart failure and stage 1 through stage 4 chronic kidney disease, or unspecified chronic kidney disease: Secondary | ICD-10-CM | POA: Diagnosis not present

## 2018-10-23 DIAGNOSIS — Z923 Personal history of irradiation: Secondary | ICD-10-CM | POA: Insufficient documentation

## 2018-10-23 DIAGNOSIS — M899 Disorder of bone, unspecified: Secondary | ICD-10-CM | POA: Diagnosis not present

## 2018-10-23 DIAGNOSIS — G809 Cerebral palsy, unspecified: Secondary | ICD-10-CM | POA: Insufficient documentation

## 2018-10-23 DIAGNOSIS — N189 Chronic kidney disease, unspecified: Secondary | ICD-10-CM | POA: Diagnosis not present

## 2018-10-23 DIAGNOSIS — Z8249 Family history of ischemic heart disease and other diseases of the circulatory system: Secondary | ICD-10-CM | POA: Insufficient documentation

## 2018-10-23 DIAGNOSIS — E1122 Type 2 diabetes mellitus with diabetic chronic kidney disease: Secondary | ICD-10-CM | POA: Insufficient documentation

## 2018-10-23 DIAGNOSIS — Z853 Personal history of malignant neoplasm of breast: Secondary | ICD-10-CM | POA: Insufficient documentation

## 2018-10-23 DIAGNOSIS — K219 Gastro-esophageal reflux disease without esophagitis: Secondary | ICD-10-CM | POA: Diagnosis not present

## 2018-10-23 DIAGNOSIS — I5032 Chronic diastolic (congestive) heart failure: Secondary | ICD-10-CM | POA: Diagnosis not present

## 2018-10-23 DIAGNOSIS — R590 Localized enlarged lymph nodes: Secondary | ICD-10-CM | POA: Diagnosis present

## 2018-10-23 DIAGNOSIS — Z170 Estrogen receptor positive status [ER+]: Secondary | ICD-10-CM

## 2018-10-23 DIAGNOSIS — C50512 Malignant neoplasm of lower-outer quadrant of left female breast: Secondary | ICD-10-CM

## 2018-10-23 LAB — CBC WITH DIFFERENTIAL/PLATELET
Abs Immature Granulocytes: 0.04 10*3/uL (ref 0.00–0.07)
Basophils Absolute: 0.1 10*3/uL (ref 0.0–0.1)
Basophils Relative: 1 %
Eosinophils Absolute: 0.5 10*3/uL (ref 0.0–0.5)
Eosinophils Relative: 6 %
HCT: 45.7 % (ref 36.0–46.0)
Hemoglobin: 14.3 g/dL (ref 12.0–15.0)
Immature Granulocytes: 0 %
LYMPHS ABS: 2.2 10*3/uL (ref 0.7–4.0)
Lymphocytes Relative: 24 %
MCH: 32.3 pg (ref 26.0–34.0)
MCHC: 31.3 g/dL (ref 30.0–36.0)
MCV: 103.2 fL — ABNORMAL HIGH (ref 80.0–100.0)
Monocytes Absolute: 0.8 10*3/uL (ref 0.1–1.0)
Monocytes Relative: 9 %
Neutro Abs: 5.4 10*3/uL (ref 1.7–7.7)
Neutrophils Relative %: 60 %
Platelets: 305 10*3/uL (ref 150–400)
RBC: 4.43 MIL/uL (ref 3.87–5.11)
RDW: 13.9 % (ref 11.5–15.5)
WBC: 9 10*3/uL (ref 4.0–10.5)
nRBC: 0 % (ref 0.0–0.2)

## 2018-10-23 LAB — PROTIME-INR
INR: 0.9
Prothrombin Time: 12 seconds (ref 11.4–15.2)

## 2018-10-23 LAB — GLUCOSE, CAPILLARY: GLUCOSE-CAPILLARY: 145 mg/dL — AB (ref 70–99)

## 2018-10-23 MED ORDER — MIDAZOLAM HCL 2 MG/2ML IJ SOLN
INTRAMUSCULAR | Status: AC | PRN
  Administered 2018-10-23: 1 mg via INTRAVENOUS

## 2018-10-23 MED ORDER — MIDAZOLAM HCL 2 MG/2ML IJ SOLN
INTRAMUSCULAR | Status: AC
  Filled 2018-10-23: qty 4

## 2018-10-23 MED ORDER — NALOXONE HCL 0.4 MG/ML IJ SOLN
INTRAMUSCULAR | Status: AC
  Filled 2018-10-23: qty 1

## 2018-10-23 MED ORDER — LIDOCAINE-EPINEPHRINE (PF) 2 %-1:200000 IJ SOLN
INTRAMUSCULAR | Status: AC
  Filled 2018-10-23: qty 20

## 2018-10-23 MED ORDER — FENTANYL CITRATE (PF) 100 MCG/2ML IJ SOLN
INTRAMUSCULAR | Status: AC
  Filled 2018-10-23: qty 4

## 2018-10-23 MED ORDER — SODIUM CHLORIDE 0.9 % IV SOLN
INTRAVENOUS | Status: DC
  Administered 2018-10-23: 12:00:00 via INTRAVENOUS

## 2018-10-23 MED ORDER — FENTANYL CITRATE (PF) 100 MCG/2ML IJ SOLN
INTRAMUSCULAR | Status: AC | PRN
  Administered 2018-10-23: 50 ug via INTRAVENOUS

## 2018-10-23 MED ORDER — FLUMAZENIL 0.5 MG/5ML IV SOLN
INTRAVENOUS | Status: AC
  Filled 2018-10-23: qty 5

## 2018-10-23 NOTE — Procedures (Signed)
Pre Procedure Dx: Inguinal lymphadenopathy Post Procedural Dx: Same  Technically successful US guided biopsy of dominant indeterminate left inguinal LN.  EBL: None  No immediate complications.   Ronny Bacon, MD Pager #: [phone]

## 2018-10-23 NOTE — Discharge Instructions (Signed)
Needle Biopsy, Care After °These instructions tell you how to care for yourself after your procedure. Your doctor may also give you more specific instructions. Call your doctor if you have any problems or questions. °What can I expect after the procedure? °After the procedure, it is common to have: °· Soreness. °· Bruising. °· Mild pain. °Follow these instructions at home: ° °· Return to your normal activities as told by your doctor. Ask your doctor what activities are safe for you. °· Take over-the-counter and prescription medicines only as told by your doctor. °· Wash your hands with soap and water before you change your bandage (dressing). If you cannot use soap and water, use hand sanitizer. °· Follow instructions from your doctor about: °? How to take care of your puncture site. °? When and how to change your bandage. °? When to remove your bandage. °· Check your puncture site every day for signs of infection. Watch for: °? Redness, swelling, or pain. °? Fluid or blood.  °? Pus or a bad smell. °? Warmth. °· Do not take baths, swim, or use a hot tub until your doctor approves. Ask your doctor if you may take showers. You may only be allowed to take sponge baths. °· Keep all follow-up visits as told by your doctor. This is important. °Contact a doctor if you have: °· A fever. °· Redness, swelling, or pain at the puncture site, and it lasts longer than a few days. °· Fluid, blood, or pus coming from the puncture site. °· Warmth coming from the puncture site. °Get help right away if: °· You have a lot of bleeding from the puncture site. °Summary °· After the procedure, it is common to have soreness, bruising, or mild pain at the puncture site. °· Check your puncture site every day for signs of infection, such as redness, swelling, or pain. °· Get help right away if you have severe bleeding from your puncture site. °This information is not intended to replace advice given to you by your health care provider. Make  sure you discuss any questions you have with your health care provider. °Document Released: 09/19/2008 Document Revised: 10/20/2017 Document Reviewed: 10/20/2017 °Elsevier Interactive Patient Education © 2019 [employer] °Moderate Conscious Sedation, Adult, Care After °These instructions provide you with information about caring for yourself after your procedure. Your health care provider may also give you more specific instructions. Your treatment has been planned according to current medical practices, but problems sometimes occur. Call your health care provider if you have any problems or questions after your procedure. °What can I expect after the procedure? °After your procedure, it is common: °· To feel sleepy for several hours. °· To feel clumsy and have poor balance for several hours. °· To have poor judgment for several hours. °· To vomit if you eat too soon. °Follow these instructions at home: °For at least 24 hours after the procedure: ° °· Do not: °? Participate in activities where you could fall or become injured. °? Drive. °? Use heavy machinery. °? Drink alcohol. °? Take sleeping pills or medicines that cause drowsiness. °? Make important decisions or sign legal documents. °? Take care of children on your own. °· Rest. °Eating and drinking °· Follow the diet recommended by your health care provider. °· If you vomit: °? Drink water, juice, or soup when you can drink without vomiting. °? Make sure you have little or no nausea before eating solid foods. °General instructions °· Have a responsible adult stay   with you until you are awake and alert. °· Take over-the-counter and prescription medicines only as told by your health care provider. °· If you smoke, do not smoke without supervision. °· Keep all follow-up visits as told by your health care provider. This is important. °Contact a health care provider if: °· You keep feeling nauseous or you keep vomiting. °· You feel light-headed. °· You develop a  rash. °· You have a fever. °Get help right away if: °· You have trouble breathing. °This information is not intended to replace advice given to you by your health care provider. Make sure you discuss any questions you have with your health care provider. °Document Released: 07/28/2013 Document Revised: 03/11/2016 Document Reviewed: 01/27/2016 °Elsevier Interactive Patient Education © 2019 [employer] ° °

## 2018-10-23 NOTE — Consult Note (Signed)
Chief Complaint: Patient was seen in consultation today for image guided left inguinal lymph node biopsy   Referring Physician(s): Gudena,Vinay  Supervising Physician: Sandi Mariscal  Patient Status: [facility] - Out-pt  History of Present Illness: [name] [name] is a 73 y.o. female with history of cerebral palsy and left breast cancer with prior mastectomy 2018 along with radiation therapy and antiestrogen therapy.  Recent follow-up imaging has revealed innumerable sclerotic bone lesions as well as left inguinal and right external iliac lymphadenopathy.  She presents today for image guided left inguinal lymph node biopsy for further evaluation.  Past Medical History:  Diagnosis Date  . Anemia   . Anxiety   . Bronchitis   . Cerebral palsy ([place])   . Chronic diastolic CHF (congestive heart failure) ([place]) 05/27/2014  . CKD (chronic kidney disease)   . Cough 05/27/2014   ?  ACE cough August, 2015?when d/c'd   . Depression   . DM type 2 with diabetic peripheral neuropathy ([place])   . Dysphagia   . Ejection fraction   . GERD (gastroesophageal reflux disease)   . Glaucoma   . Gout   . History of radiation therapy 08/18/17- 10/01/17   Left Chest wall and regional lymph nodes treated to 50 Gy in 25 fractions followed by a 10 Gy boost in 5 fractions to the left chest wall scar.   . Hyperaldosteronism ([place])   . Hypercholesterolemia   . Hypertension   . Hypertensive heart disease   . Morbid obesity ([place])   . Neuropathy   . Osteoarthritis   . PAD (peripheral artery disease) ([place])     Past Surgical History:  Procedure Laterality Date  . BLADDER SURGERY    . BREAST BIOPSY    . CLOSED REDUCTION PROXIMAL FIBULAR FRACTURE Left   . LEG SURGERY Left    upper leg  . MASTECTOMY Left    2018  . MASTECTOMY WITH AXILLARY LYMPH NODE DISSECTION Left 06/25/2017   Procedure: LEFT MODIFIED RADICAL MASTECTOMY WITH AXILLARY LYMPH NODE DISSECTION ERAS PATHWAY;  Surgeon: Donnie Mesa, MD;  Location: [facility];   Service: General;  Laterality: Left;    Allergies: Miconazole  Medications: Prior to Admission medications   Medication Sig Start Date End Date Taking? Authorizing Provider  ADVAIR DISKUS 250-50 MCG/DOSE AEPB Inhale 1 puff into the lungs 2 (two) times daily. 06/25/15  Yes [provider]  allopurinol (ZYLOPRIM) 300 MG tablet Take 300 mg by mouth daily.   Yes [provider]  Calcium Carbonate (CALTRATE 600 PO) Take 600 mg by mouth 2 (two) times daily.   Yes [provider]  Cholecalciferol 50000 units TABS Take 50,000 Units by mouth every 30 (thirty) days. On the 17th for osteoporosis   Yes [provider]  cyclobenzaprine (FLEXERIL) 10 MG tablet Take 10 mg by mouth 3 (three) times daily.    Yes [provider]  docusate sodium (COLACE) 100 MG capsule Take 100 mg by mouth 2 (two) times daily.   Yes [provider]  escitalopram (LEXAPRO) 10 MG tablet Take 10 mg by mouth daily.   Yes [provider]  famotidine (PEPCID) 20 MG tablet Take 20 mg by mouth 2 (two) times daily.   Yes [provider]  ferrous sulfate 325 (65 FE) MG tablet Take 325 mg by mouth daily with breakfast.   Yes [provider]  furosemide (LASIX) 80 MG tablet Take 80 mg by mouth daily.   Yes [provider]  gabapentin (NEURONTIN)  300 MG capsule Take 300 mg by mouth 3 (three) times daily.   Yes [provider]  letrozole (FEMARA) 2.5 MG tablet Take 1 tablet (2.5 mg total) by mouth daily. 11/04/17  Yes Nicholas Lose, MD  loratadine (CLARITIN) 10 MG tablet Take 10 mg by mouth daily.   Yes [provider]  losartan (COZAAR) 25 MG tablet Take 25 mg by mouth daily.   Yes [provider]  metFORMIN (GLUCOPHAGE) 500 MG tablet Take 500 mg by mouth 2 (two) times daily.    Yes [provider]  metoprolol tartrate (LOPRESSOR) 25 MG tablet Take 25 mg by mouth 2 (two) times daily.   Yes [provider]    Multiple Vitamins-Minerals (CERTAGEN PO) Take 1 tablet by mouth daily.   Yes [provider]  olopatadine (PATANOL) 0.1 % ophthalmic solution Place 1 drop into both eyes daily.    Yes [provider]  ondansetron (ZOFRAN) 4 MG tablet Take 4 mg by mouth every 6 (six) hours as needed for nausea or vomiting.   Yes [provider]  oxyCODONE-acetaminophen (PERCOCET) 10-325 MG tablet Take one tablet by mouth three times daily for pain. Do not exceed 4gm of Tylenol in 24 hours 03/12/16  Yes Estill Dooms, MD  pantoprazole (PROTONIX) 40 MG tablet Take 40 mg by mouth daily.   Yes [provider]  polyethylene glycol (MIRALAX / GLYCOLAX) packet Take 17 g by mouth daily.   Yes [provider]  potassium chloride SA (K-DUR,KLOR-CON) 20 MEQ tablet Take 20 mEq by mouth 2 (two) times daily.   Yes [provider]  Propylene Glycol (SYSTANE BALANCE OP) Apply 1 drop to eye 2 (two) times daily.   Yes [provider]  sennosides-docusate sodium (SENOKOT-S) 8.6-50 MG tablet Take 1 tablet by mouth at bedtime.   Yes [provider]  simvastatin (ZOCOR) 10 MG tablet Take 10 mg by mouth at bedtime.   Yes [provider]  Tiotropium Bromide Monohydrate (SPIRIVA RESPIMAT) 2.5 MCG/ACT AERS Inhale 2 puffs into the lungs daily.   Yes [provider]  vitamin C (ASCORBIC ACID) 500 MG tablet Take 500 mg by mouth 2 (two) times daily.   Yes [provider]  acetaminophen (TYLENOL) 325 MG tablet Take 325 mg by mouth every 6 (six) hours as needed for moderate pain. Do not exceed 4 gms in 24 hours    [provider]  guaiFENesin (MUCINEX) 600 MG 12 hr tablet Take 600 mg by mouth 2 (two) times daily.    [provider]  ipratropium-albuterol (DUONEB) 0.5-2.5 (3) MG/3ML SOLN Take 3 mLs by nebulization every 6 (six) hours as needed (cough, congestion, and wheezing).    [provider]     Family History   Problem Relation Age of Onset  . Heart disease Mother   . Heart disease Father     Social History   Socioeconomic History  . Marital status: Single    Spouse name: Not on file  . Number of children: Not on file  . Years of education: Not on file  . Highest education level: Not on file  Occupational History  . Not on file  Social Needs  . Financial resource strain: Not on file  . Food insecurity:    Worry: Not on file    Inability: Not on file  . Transportation needs:    Medical: Not on file    Non-medical: Not on file  Tobacco Use  . Smoking  status: Former Smoker    Packs/day: 2.00    Years: 40.00    Pack years: 80.00    Types: Cigarettes    Last attempt to quit: 10/22/2003    Years since quitting: 15.0  . Smokeless tobacco: Never Used  Substance and Sexual Activity  . Alcohol use: No  . Drug use: No  . Sexual activity: Never  Lifestyle  . Physical activity:    Days per week: Not on file    Minutes per session: Not on file  . Stress: Not on file  Relationships  . Social connections:    Talks on phone: Not on file    Gets together: Not on file    Attends religious service: Not on file    Active member of club or organization: Not on file    Attends meetings of clubs or organizations: Not on file    Relationship status: Not on file  Other Topics Concern  . Not on file  Social History Narrative  . Not on file      Review of Systems per nursing staff patient denies fever, headache, chest pain, dyspnea, cough, abdominal pain, back pain, nausea, vomiting or bleeding.  She is hard of hearing, has some difficulty with vision and is mentally challenged.  Vital Signs: BP 132/80   Pulse 84   Temp 98.6 F (37 C) (Oral)   Resp 18   SpO2 93%   Physical Exam awake, in no distress.  Chest clear to auscultation bilaterally anteriorly.  Heart with regular rate and rhythm.  Abdomen obese, soft, positive bowel sounds, nontender.  Palpable left inguinal  adenopathy  Imaging: Ct Abdomen Pelvis W Contrast  Result Date: 10/12/2018 CLINICAL DATA:  Mildly enlarged left inguinal lymph nodes on ultrasound exam. EXAM: CT ABDOMEN AND PELVIS WITH CONTRAST TECHNIQUE: Multidetector CT imaging of the abdomen and pelvis was performed using the standard protocol following bolus administration of intravenous contrast. CONTRAST:  12mL OMNIPAQUE IOHEXOL 300 MG/ML  SOLN COMPARISON:  11/15/2010 FINDINGS: Lower chest: 4 x 7 x 6 cm fluid collection is identified in the left mastectomy bed, incompletely visualized. Hepatobiliary: No focal abnormality within the liver parenchyma. There is no evidence for gallstones, gallbladder wall thickening, or pericholecystic fluid. No intrahepatic or extrahepatic biliary dilation. Pancreas: No focal mass lesion. No dilatation of the main duct. No intraparenchymal cyst. No peripancreatic edema. Spleen: No splenomegaly. No focal mass lesion. Adrenals/Urinary Tract: No adrenal nodule or mass. Small hypoattenuating lesions in each kidney are too small to characterize. No evidence for hydroureter. The urinary bladder appears normal for the degree of distention. Stomach/Bowel: Stomach is nondistended. No gastric wall thickening. No evidence of outlet obstruction. Duodenum is normally positioned as is the ligament of Treitz. No small bowel wall thickening. No small bowel dilatation. The terminal ileum is normal. The appendix is not visualized, but there is no edema or inflammation in the region of the cecum. No gross colonic mass. No colonic wall thickening. Vascular/Lymphatic: There is abdominal aortic atherosclerosis without aneurysm. There is no gastrohepatic or hepatoduodenal ligament lymphadenopathy. No intraperitoneal or retroperitoneal lymphadenopathy. 1.8 cm short axis right external iliac lymph node is identified on 79/2. 11 mm short axis right groin lymph node associated. 3.4 x 2.7 cm irregular soft tissue lesion is identified in the left  groin. There is an adjacent lymph node measuring 1.7 cm short axis. Reproductive: Uterus unremarkable.  There is no adnexal mass. Other: No intraperitoneal free fluid. Musculoskeletal: Innumerable sclerotic bone lesions are seen in the  thoracolumbar spine and bony pelvis. IMPRESSION: 1. 3.4 x 2.7 cm irregular superficial soft tissue lesion in the left groin is probably an enlarged lymph node. There is adjacent left inguinal and right external iliac lymphadenopathy. Imaging features concerning for metastatic disease given associated bony findings. 2. Innumerable sclerotic bone lesions consistent with metastatic disease. 3. 4 x 7 x 6 cm fluid collection in the left mastectomy bed, incompletely visualized. This is likely a chronic hematoma or seroma and should be palpable. 4. Aortic Atherosclerosis (ICD10-I70.0). These results will be called to the ordering clinician or representative by the Radiologist Assistant, and communication documented in the PACS or zVision Dashboard. Electronically Signed   By: Misty Stanley M.D.   On: 10/12/2018 15:20    Labs:  CBC: Recent Labs    09/22/18 1226 10/12/18 1237 10/23/18 1134  WBC 8.4 8.6 9.0  HGB 13.4 13.8 14.3  HCT 40.0 42.9 45.7  PLT 332 279 305    COAGS: No results for input(s): INR, APTT in the last 8760 hours.  BMP: Recent Labs    10/12/18 1237  NA 143  K 4.1  CL 103  CO2 26  GLUCOSE 173*  BUN 16  CALCIUM 10.7*  CREATININE 0.81  GFRNONAA >60  GFRAA >60    LIVER FUNCTION TESTS: Recent Labs    09/22/18 1226 10/12/18 1237  BILITOT 0.3 0.3  AST 28 17  ALT 26 21  ALKPHOS 108 108  PROT 6.6 7.3  ALBUMIN 4.1 3.6    TUMOR MARKERS: No results for input(s): AFPTM, CEA, CA199, CHROMGRNA in the last 8760 hours.  Assessment and Plan: 73 y.o. female with history of cerebral palsy and left breast cancer with prior mastectomy 2018 along with radiation therapy and antiestrogen therapy.  Recent follow-up imaging has revealed innumerable  sclerotic bone lesions as well as left inguinal and right external iliac lymphadenopathy.  She presents today for image guided left inguinal lymph node biopsy for further evaluation.Risks and benefits discussed with the patient/charge nurse-caregiver, [facility], including, but not limited to bleeding, infection, damage to adjacent structures or low yield requiring additional tests.  All of the patient's questions were answered, patient is agreeable to proceed. Consent signed and in chart.    Thank you for this interesting consult.  I greatly enjoyed meeting [name] [name] and look forward to participating in their care.  A copy of this report was sent to the requesting provider on this date.  Electronically Signed: D. Rowe Robert, PA-C 10/23/2018, 11:44 AM   I spent a total of 25 minutes    in face to face in clinical consultation, greater than 50% of which was counseling/coordinating care for image guided left inguinal lymph node biopsy

## 2018-10-26 ENCOUNTER — Non-Acute Institutional Stay: Payer: Medicare Other | Admitting: Internal Medicine

## 2018-10-27 ENCOUNTER — Inpatient Hospital Stay: Payer: Medicare Other | Admitting: Hematology and Oncology

## 2018-10-27 ENCOUNTER — Telehealth: Payer: Self-pay | Admitting: *Deleted

## 2018-10-27 NOTE — Telephone Encounter (Signed)
Called [facility] to r/s appt with Dr. Lindi Adie d/t pathology report not being reported. Scheduled and confirmed new appt for 1/17 at 1100

## 2018-11-05 NOTE — Progress Notes (Signed)
Patient Care Team: Patient, No Pcp Per as PCP - General (General Practice) Nicholas Lose, MD as Consulting Physician (Hematology and Oncology) Donnie Mesa, MD as Consulting Physician (General Surgery) Eppie Gibson, MD as Attending Physician (Radiation Oncology) Gardenia Phlegm, NP as Nurse Practitioner (Hematology and Oncology)  DIAGNOSIS:    ICD-10-CM   1. 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%, PR 95%, Ki-67 25%, HER-2 negative ratio 0.97, grade 2; lymph node biopsy positive for metastatic cancer, ER 95%, PR 95%, Ki-67 40%, HER-2 negative ratio 1.2; left breast mass 7 irregular 9 cm with nipple retraction inflammatory breast cancer, at least 3 enlarged abnormal lymph nodes, T4d N1 stage IIIa (AJCC 8)    06/25/2017 Surgery    Left breast modified radical mastectomy: Invasive lobular carcinoma, 10.5 cm, grade 2, tumor involves skin and nipple, 12/13 lymph nodes positive, involves skeletal muscle and adipose tissue, ER 95%, PR 95%, HER-2 negative, Ki-67 25% to 40% T4b N3a stage IIIB AJCC 8    08/19/2017 - 10/01/2017 Radiation Therapy    Adj XRT    10/2017 -  Anti-estrogen oral therapy    Letrozole daily    10/12/2018 Imaging    CT abdomen and pelvis done to evaluate inguinal lymph node: 3.4 cm left inguinal lymph node.  Additional inguinal and external iliac lymphadenopathy.  Innumerable sclerotic bone lesions consistent with metastatic disease, 7 cm fluid collection left mastectomy bed.     CHIEF COMPLIANT: Follow-up of metastatic breast cancer to discuss treatment options  INTERVAL HISTORY: [name] [name] is a 73 y.o. with above-mentioned history of  metastatic breast cancer with metastases in the spine, pelvis, and inguinal lymph node. An ultrasound and biopsy of the left inguinal lymph node on 10/23/18 showed poorly differentiated metastatic carcinoma, positive for ck 5/6, ck 903, p63, and ck7 It was negative for CDX-2, ck 20, Napsin-a, TFF-1, p-53, PAX-8. She presents to the clinic today with a nurse from the facility where she lives. It is difficult to communicate with her because she is hard of hearing.   REVIEW OF SYSTEMS:   Constitutional: Denies fevers, chills or abnormal weight loss Eyes: Denies blurriness of vision Ears, nose, mouth, throat, and face: Denies mucositis or sore throat Respiratory: Denies cough, dyspnea or wheezes Cardiovascular: Denies palpitation, chest discomfort Gastrointestinal:  Denies nausea, heartburn or change in bowel habits Skin: Denies abnormal skin rashes Lymphatics: Denies new lymphadenopathy or easy bruising Neurological: Denies numbness, tingling or new weaknesses Behavioral/Psych: Mood is stable, no new changes  Extremities: No lower extremity edema Breast: denies any pain or lumps or nodules in either breasts All other systems were reviewed with the patient and are negative.  I have reviewed the past medical history, past surgical history, social history and family history with the patient and they are unchanged from previous note.  ALLERGIES:  is allergic to miconazole.  MEDICATIONS:  Current Outpatient Medications  Medication Sig Dispense Refill  . acetaminophen (TYLENOL) 325 MG tablet Take 325 mg by mouth every 6 (six) hours as needed for moderate pain. Do not exceed 4 gms in 24 hours    . ADVAIR DISKUS 250-50 MCG/DOSE AEPB Inhale 1 puff into the lungs 2 (  two) times daily.  0  . allopurinol (ZYLOPRIM) 300 MG tablet Take 300 mg by mouth daily.    . Calcium Carbonate (CALTRATE 600 PO) Take 600 mg by mouth 2 (two) times daily.    . Cholecalciferol 50000 units TABS Take 50,000 Units by mouth  every 30 (thirty) days. On the 17th for osteoporosis    . cyclobenzaprine (FLEXERIL) 10 MG tablet Take 10 mg by mouth 3 (three) times daily.     Marland Kitchen docusate sodium (COLACE) 100 MG capsule Take 100 mg by mouth 2 (two) times daily.    Marland Kitchen escitalopram (LEXAPRO) 10 MG tablet Take 10 mg by mouth daily.    . famotidine (PEPCID) 20 MG tablet Take 20 mg by mouth 2 (two) times daily.    . ferrous sulfate 325 (65 FE) MG tablet Take 325 mg by mouth daily with breakfast.    . furosemide (LASIX) 80 MG tablet Take 80 mg by mouth daily.    Marland Kitchen gabapentin (NEURONTIN) 300 MG capsule Take 300 mg by mouth 3 (three) times daily.    Marland Kitchen guaiFENesin (MUCINEX) 600 MG 12 hr tablet Take 600 mg by mouth 2 (two) times daily.    Marland Kitchen ipratropium-albuterol (DUONEB) 0.5-2.5 (3) MG/3ML SOLN Take 3 mLs by nebulization every 6 (six) hours as needed (cough, congestion, and wheezing).    Marland Kitchen letrozole (FEMARA) 2.5 MG tablet Take 1 tablet (2.5 mg total) by mouth daily. 90 tablet 3  . loratadine (CLARITIN) 10 MG tablet Take 10 mg by mouth daily.    Marland Kitchen losartan (COZAAR) 25 MG tablet Take 25 mg by mouth daily.    . metFORMIN (GLUCOPHAGE) 500 MG tablet Take 500 mg by mouth 2 (two) times daily.     . metoprolol tartrate (LOPRESSOR) 25 MG tablet Take 25 mg by mouth 2 (two) times daily.    . Multiple Vitamins-Minerals (CERTAGEN PO) Take 1 tablet by mouth daily.    Marland Kitchen olopatadine (PATANOL) 0.1 % ophthalmic solution Place 1 drop into both eyes daily.     . ondansetron (ZOFRAN) 4 MG tablet Take 4 mg by mouth every 6 (six) hours as needed for nausea or vomiting.    Marland Kitchen oxyCODONE-acetaminophen (PERCOCET) 10-325 MG tablet Take one tablet by mouth three times daily for pain. Do not exceed 4gm of Tylenol in 24 hours 90 tablet 0  . pantoprazole (PROTONIX) 40 MG tablet Take 40 mg by mouth daily.    . polyethylene glycol (MIRALAX / GLYCOLAX) packet Take 17 g by mouth daily.    . potassium chloride SA (K-DUR,KLOR-CON) 20 MEQ tablet Take 20 mEq by mouth 2 (two)  times daily.    Marland Kitchen Propylene Glycol (SYSTANE BALANCE OP) Apply 1 drop to eye 2 (two) times daily.    . sennosides-docusate sodium (SENOKOT-S) 8.6-50 MG tablet Take 1 tablet by mouth at bedtime.    . simvastatin (ZOCOR) 10 MG tablet Take 10 mg by mouth at bedtime.    . Tiotropium Bromide Monohydrate (SPIRIVA RESPIMAT) 2.5 MCG/ACT AERS Inhale 2 puffs into the lungs daily.    . vitamin C (ASCORBIC ACID) 500 MG tablet Take 500 mg by mouth 2 (two) times daily.     No current facility-administered medications for this visit.     PHYSICAL EXAMINATION: ECOG PERFORMANCE STATUS: 1 - Symptomatic but completely ambulatory  Vitals:   11/06/18 1122  BP: (!) 118/98  Pulse: 92  Resp: 17  Temp: 98.4 F (36.9 C)  SpO2: 95%   Filed Weights    GENERAL:  alert, no distress and comfortable SKIN: skin color, texture, turgor are normal, no rashes or significant lesions EYES: normal, Conjunctiva are pink and non-injected, sclera clear OROPHARYNX: no exudate, no erythema and lips, buccal mucosa, and tongue normal  NECK: supple, thyroid normal size, non-tender, without nodularity LYMPH: no palpable lymphadenopathy in the cervical, axillary or inguinal LUNGS: clear to auscultation and percussion with normal breathing effort HEART: regular rate & rhythm and no murmurs and no lower extremity edema ABDOMEN: abdomen soft, non-tender and normal bowel sounds MUSCULOSKELETAL: no cyanosis of digits and no clubbing  NEURO: alert & oriented x 3 with fluent speech, no focal motor/sensory deficits EXTREMITIES: No lower extremity edema  LABORATORY DATA:  I have reviewed the data as listed CMP Latest Ref Rng & Units 10/12/2018 09/22/2018 06/26/2017  Glucose 70 - 99 mg/dL 173(H) - 131(H)  BUN 8 - 23 mg/dL 16 - 14  Creatinine 0.44 - 1.00 mg/dL 0.81 - 0.74  Sodium 135 - 145 mmol/L 143 - 139  Potassium 3.5 - 5.1 mmol/L 4.1 - 4.2  Chloride 98 - 111 mmol/L 103 - 104  CO2 22 - 32 mmol/L 26 - 27  Calcium 8.9 - 10.3 mg/dL  10.7(H) - 8.5(L)  Total Protein 6.5 - 8.1 g/dL 7.3 6.6 -  Total Bilirubin 0.3 - 1.2 mg/dL 0.3 0.3 -  Alkaline Phos 38 - 126 U/L 108 108 -  AST 15 - 41 U/L 17 28 -  ALT 0 - 44 U/L 21 26 -    Lab Results  Component Value Date   WBC 9.0 10/23/2018   HGB 14.3 10/23/2018   HCT 45.7 10/23/2018   MCV 103.2 (H) 10/23/2018   PLT 305 10/23/2018   NEUTROABS 5.4 10/23/2018    ASSESSMENT & PLAN:  Malignant neoplasm of lower-outer quadrant of left breast of female, estrogen receptor positive ([place]) 06/25/2017:Left breast modified radical mastectomy: Invasive lobular carcinoma, 10.5 cm, grade 2, tumor involves skin and nipple, 12/13 lymph nodes positive, involves skeletal muscle and adipose tissue, ER 95%, PR 95%, HER-2 negative, Ki-67 25% to 40% T4b N3a stage IIIB AJCC 8  Treatment summary: 1.Ideally patient needs chemotherapy but she is in no shape to receive chemotherapy. Her performance status is poor and she has many comorbidities that makes chemotherapy not an option. 2.adjuvant radiation therapy10/30/2018-10/01/2017 3.Adjuvant antiestrogen therapywith letrozole 2.5 mg daily started 11/04/2017 ---------------------------------------------------------------------------------------------------------------------------------------------   10/12/2018: CT abdomen pelvis: done to evaluate inguinal lymph node: 3.4 cm left inguinal lymph node.  Additional inguinal and external iliac lymphadenopathy.  Innumerable sclerotic bone lesions consistent with metastatic disease, 7 cm fluid collection left mastectomy bed.  Radiology review: I reviewed the CT scan report and described the findings including the lymphadenopathy and bone metastases.  Inguinal lymph node biopsy: Poorly differentiated carcinoma consistent with metastatic carcinoma strongly positive for CK 5 and 6, CK 903, p63 and strong positivity with CK7.  Negative for CDX2, CK20, Napsin A, TTF-1, p53 and PAX-8.  Differential diagnosis  metastatic squamous cell carcinoma versus metastatic urothelial carcinoma negative for ER PR  Patient has extremely poor performance status and is unlikely to tolerate chemotherapy. Because of this I will request palliative care/hospice to help her.     No orders of the defined types were placed in this encounter.  The patient has a good understanding of the overall plan. she agrees with it. she will call with any problems that may develop before the next visit here.  Nicholas Lose, MD 11/06/2018  Julious Oka Dorshimer am acting as Education administrator  for Dr. Nicholas Lose.  I have reviewed the above documentation for accuracy and completeness, and I agree with the above.

## 2018-11-06 ENCOUNTER — Telehealth: Payer: Self-pay | Admitting: Hematology and Oncology

## 2018-11-06 ENCOUNTER — Inpatient Hospital Stay: Payer: Medicare Other | Attending: Hematology and Oncology | Admitting: Hematology and Oncology

## 2018-11-06 DIAGNOSIS — Z79899 Other long term (current) drug therapy: Secondary | ICD-10-CM | POA: Diagnosis not present

## 2018-11-06 DIAGNOSIS — Z7984 Long term (current) use of oral hypoglycemic drugs: Secondary | ICD-10-CM | POA: Diagnosis not present

## 2018-11-06 DIAGNOSIS — Z923 Personal history of irradiation: Secondary | ICD-10-CM | POA: Insufficient documentation

## 2018-11-06 DIAGNOSIS — C7951 Secondary malignant neoplasm of bone: Secondary | ICD-10-CM | POA: Diagnosis not present

## 2018-11-06 DIAGNOSIS — Z79811 Long term (current) use of aromatase inhibitors: Secondary | ICD-10-CM | POA: Insufficient documentation

## 2018-11-06 DIAGNOSIS — Z170 Estrogen receptor positive status [ER+]: Secondary | ICD-10-CM

## 2018-11-06 DIAGNOSIS — C50512 Malignant neoplasm of lower-outer quadrant of left female breast: Secondary | ICD-10-CM | POA: Insufficient documentation

## 2018-11-06 NOTE — Assessment & Plan Note (Signed)
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 Her performance status is poor and she has many comorbidities that makes chemotherapy not an option. 2.adjuvant radiation therapy10/30/2018-10/01/2017 3.Adjuvant antiestrogen therapywith letrozole 2.5 mg daily started 11/04/2017 ---------------------------------------------------------------------------------------------------------------------------------------------   10/12/2018: CT abdomen pelvis: done to evaluate inguinal lymph node: 3.4 cm left inguinal lymph node.  Additional inguinal and external iliac lymphadenopathy.  Innumerable sclerotic bone lesions consistent with metastatic disease, 7 cm fluid collection left mastectomy bed.  Radiology review: I reviewed the CT scan report and described the findings including the lymphadenopathy and bone metastases.  Inguinal lymph node biopsy: Poorly differentiated carcinoma consistent with metastatic carcinoma strongly positive for CK 5 and 6, CK 903, p63 and strong positivity with CK7.  Negative for CDX2, CK20, Napsin A, TTF-1, p53 and PAX-8.  Differential diagnosis metastatic squamous cell carcinoma versus metastatic urothelial carcinoma negative for ER PR  Patient has extremely poor performance status and is unlikely to tolerate chemotherapy. Because of this I will request palliative care/hospice to help her.

## 2018-11-06 NOTE — Telephone Encounter (Signed)
No los °

## 2018-12-23 ENCOUNTER — Ambulatory Visit: Payer: Medicare Other | Admitting: Cardiovascular Disease

## 2018-12-24 ENCOUNTER — Encounter: Payer: Self-pay | Admitting: *Deleted

## 2019-04-13 ENCOUNTER — Encounter: Payer: Self-pay | Admitting: Internal Medicine

## 2019-04-26 ENCOUNTER — Telehealth: Payer: Self-pay

## 2019-04-26 NOTE — Telephone Encounter (Signed)
Staff from [facility] called office, pt received letter for procedure recall. She is a hospice pt.

## 2019-08-12 NOTE — Assessment & Plan Note (Deleted)
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 Her performance status is poor and she has many comorbidities that makes chemotherapy not an option. 2.adjuvant radiation therapy10/30/2018-10/01/2017 3.Adjuvant antiestrogen therapywith letrozole 2.5 mg daily started 11/04/2017 ---------------------------------------------------------------------------------------------------------------------------------------------   10/12/2018: CT abdomen pelvis: done to evaluate inguinal lymph node: 3.4 cm left inguinal lymph node.  Additional inguinal and external iliac lymphadenopathy.  Innumerable sclerotic bone lesions consistent with metastatic disease, 7 cm fluid collection left mastectomy bed.   Inguinal lymph node biopsy: Poorly differentiated carcinoma consistent with metastatic carcinoma strongly positive for CK 5 and 6, CK 903, p63 and strong positivity with CK7.  Negative for CDX2, CK20, Napsin A, TTF-1, p53 and PAX-8.  Differential diagnosis metastatic squamous cell carcinoma versus metastatic urothelial carcinoma negative for ER PR  Patient has extremely poor performance status and is unlikely to tolerate chemotherapy. Because of this I will request palliative care/hospice to help her.

## 2019-08-13 ENCOUNTER — Ambulatory Visit: Payer: Medicare Other | Admitting: Hematology and Oncology

## 2019-12-20 DEATH — deceased

## 2020-03-23 IMAGING — CT CT ABD-PELV W/ CM
2 of 5 series · 15 of 46 positions shown, 17 images · IV contrast (APPLIED)
Comparison: 11/15/2010

CLINICAL DATA: Mildly enlarged left inguinal lymph nodes on
ultrasound exam.

EXAM:
CT ABDOMEN AND PELVIS WITH CONTRAST
TECHNIQUE: Multidetector CT imaging of the abdomen and pelvis was performed
using the standard protocol following bolus administration of
intravenous contrast.
CONTRAST:  100mL OMNIPAQUE IOHEXOL 300 MG/ML  SOLN

[Series 2: axial st · axial · 0.97mm/px · z∈[-816,-356]mm · 12 of 106 slices shown, 14 images]
[im 7/106  soft-tissue]
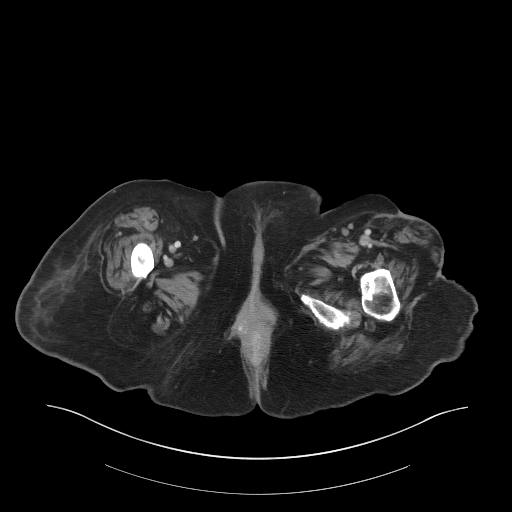
[im 7/106  bone]
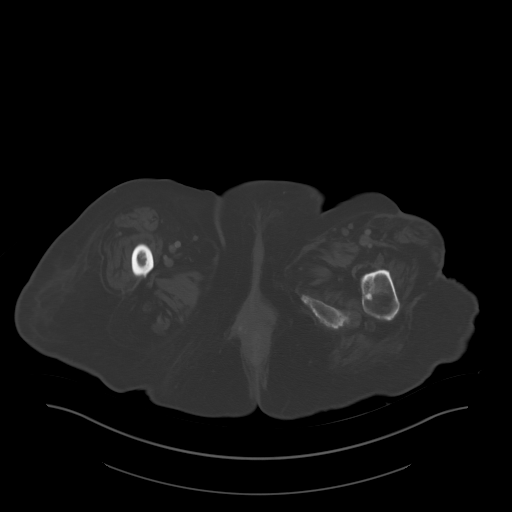
[im 14/106  soft-tissue]
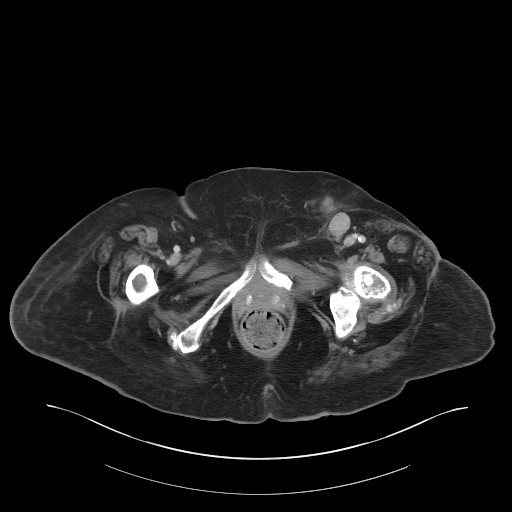
[im 27/106  soft-tissue]
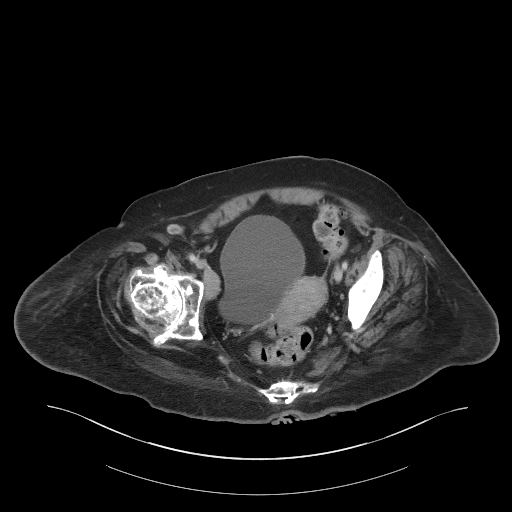
[im 33/106  soft-tissue]
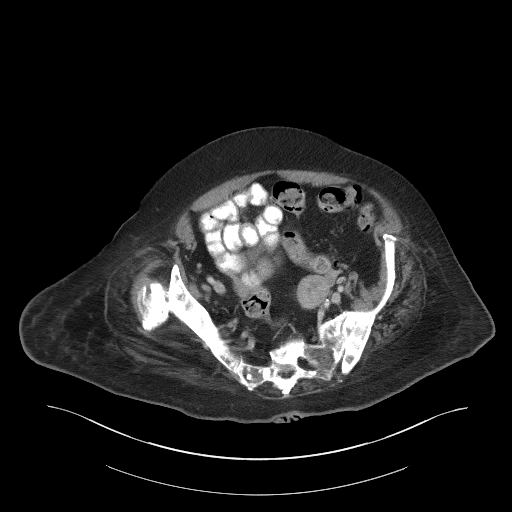
[im 40/106  soft-tissue]
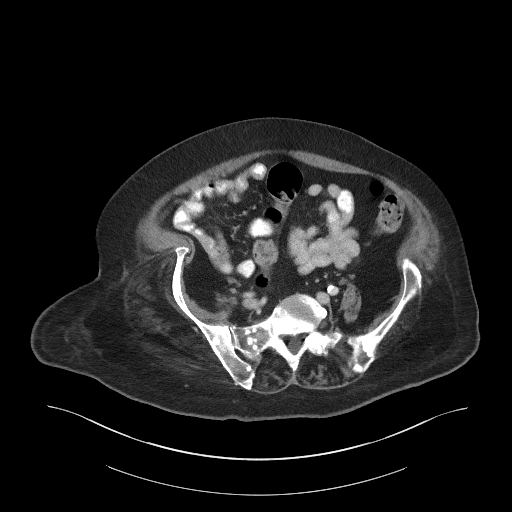
[im 46/106  soft-tissue]
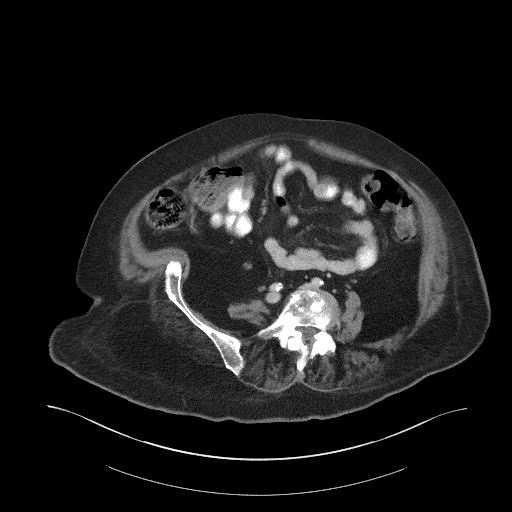
[im 60/106  soft-tissue]
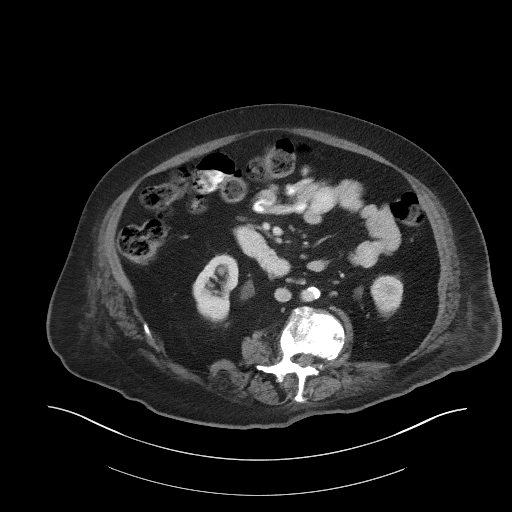
[im 66/106  soft-tissue]
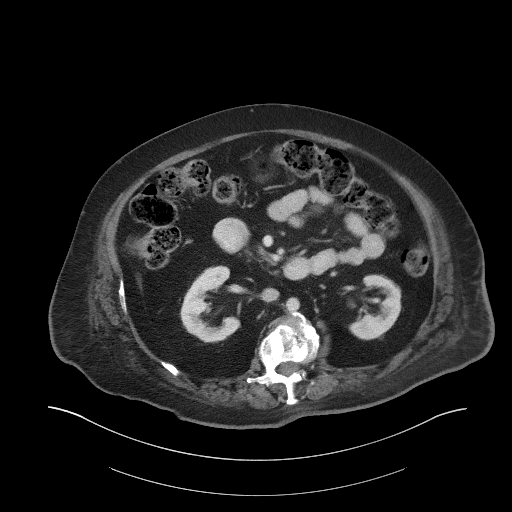
[im 73/106  soft-tissue]
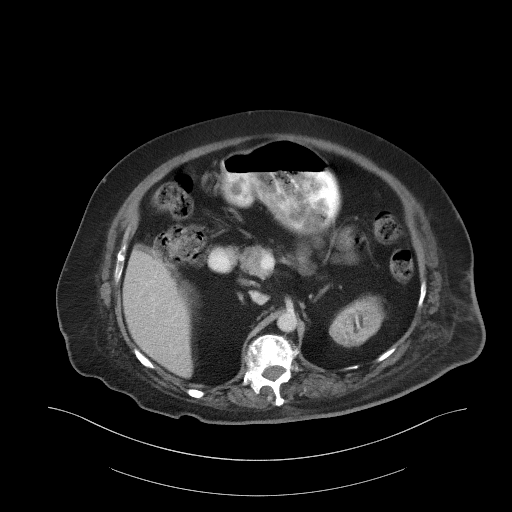
[im 73/106  bone]
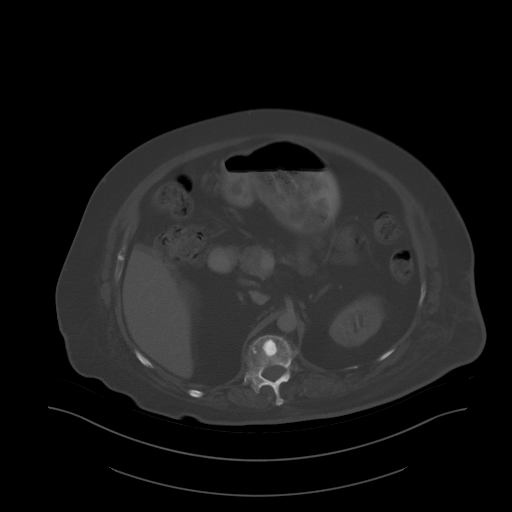
[im 79/106  soft-tissue]
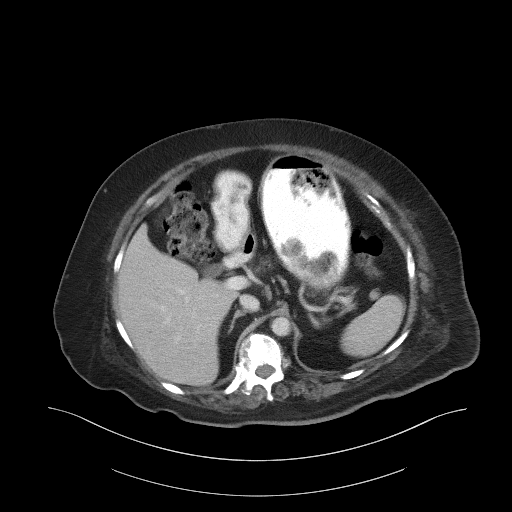
[im 92/106  soft-tissue]
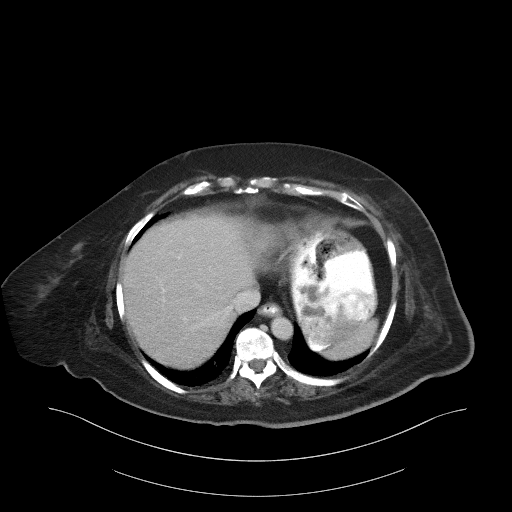
[im 99/106  soft-tissue]
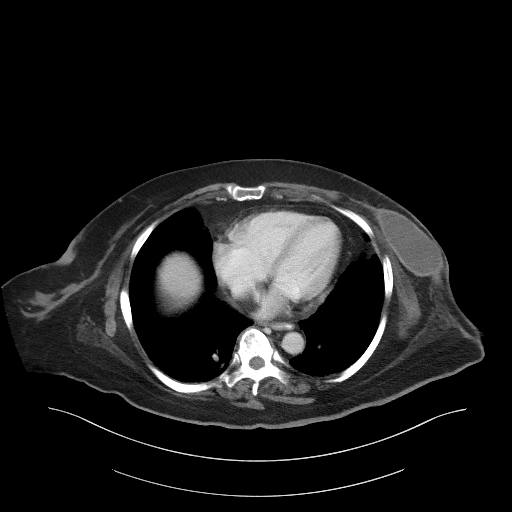

[Series 5: coronal st · coronal · 0.88mm/px · 3 of 108 slices shown]
[im 36/108  soft-tissue]
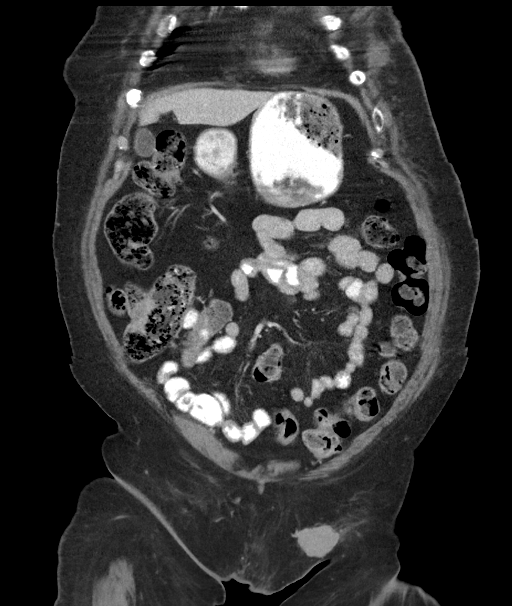
[im 48/108  soft-tissue]
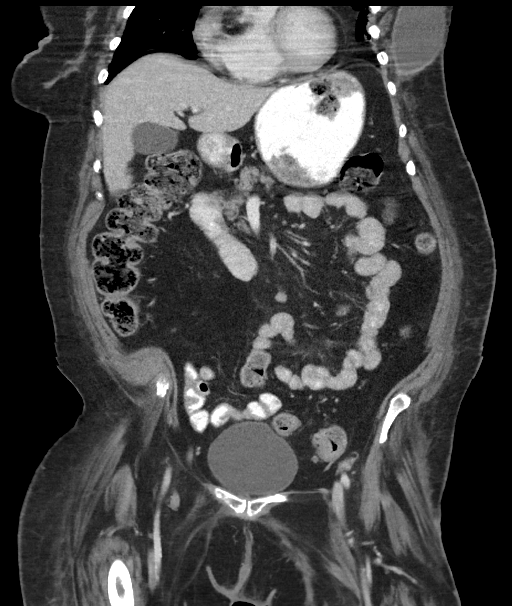
[im 60/108  soft-tissue]
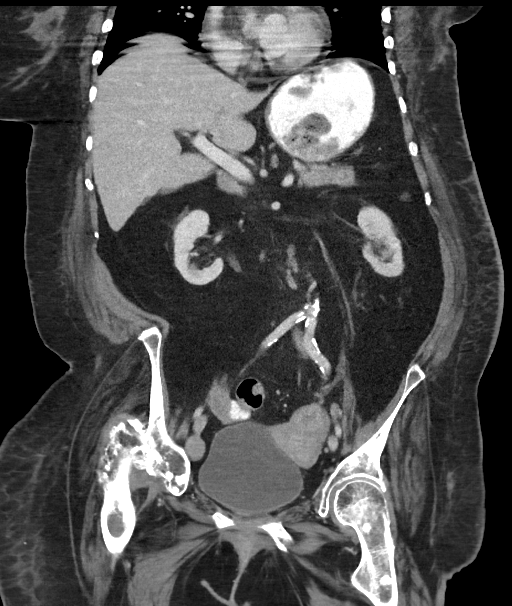

[15 of 46 positions shown; findings below may reference images not displayed]

FINDINGS: Lower chest: 4 x 7 x 6 cm fluid collection is identified in the left
mastectomy bed, incompletely visualized.

Hepatobiliary: No focal abnormality within the liver parenchyma.
There is no evidence for gallstones, gallbladder wall thickening, or
pericholecystic fluid. No intrahepatic or extrahepatic biliary
dilation.

Pancreas: No focal mass lesion. No dilatation of the main duct. No
intraparenchymal cyst. No peripancreatic edema.

Spleen: No splenomegaly. No focal mass lesion.

Adrenals/Urinary Tract: No adrenal nodule or mass. Small
hypoattenuating lesions in each kidney are too small to
characterize. No evidence for hydroureter. The urinary bladder
appears normal for the degree of distention.

Stomach/Bowel: Stomach is nondistended. No gastric wall thickening.
No evidence of outlet obstruction. Duodenum is normally positioned
as is the ligament of Treitz. No small bowel wall thickening. No
small bowel dilatation. The terminal ileum is normal. The appendix
is not visualized, but there is no edema or inflammation in the
region of the cecum. No gross colonic mass. No colonic wall
thickening.

Vascular/Lymphatic: There is abdominal aortic atherosclerosis
without aneurysm. There is no gastrohepatic or hepatoduodenal
ligament lymphadenopathy. No intraperitoneal or retroperitoneal
lymphadenopathy. 1.8 cm short axis right external iliac lymph node
is identified on 79/2. 11 mm short axis right groin lymph node
associated. 3.4 x 2.7 cm irregular soft tissue lesion is identified
in the left groin. There is an adjacent lymph node measuring 1.7 cm
short axis.

Reproductive: Uterus unremarkable.  There is no adnexal mass.

Other: No intraperitoneal free fluid.

Musculoskeletal: Innumerable sclerotic bone lesions are seen in the
thoracolumbar spine and bony pelvis.
IMPRESSION: 1. 3.4 x 2.7 cm irregular superficial soft tissue lesion in the left
groin is probably an enlarged lymph node. There is adjacent left
inguinal and right external iliac lymphadenopathy. Imaging features
concerning for metastatic disease given associated bony findings.
2. Innumerable sclerotic bone lesions consistent with metastatic
disease.
3. 4 x 7 x 6 cm fluid collection in the left mastectomy bed,
incompletely visualized. This is likely a chronic hematoma or seroma
and should be palpable.
4. Aortic Atherosclerosis ([id number]).

These results will be called to the ordering clinician or
representative by the Radiologist Assistant, and communication
documented in the PACS or zVision Dashboard.
